# Patient Record
Sex: Male | Born: 1950 | Race: White | Hispanic: No | Marital: Married | Smoking: Never smoker
Health system: Southern US, Community
[De-identification: ages and names within clinical notes are randomized; demographics above are authoritative.]

## PROBLEM LIST (undated history)

## (undated) DIAGNOSIS — C61 Malignant neoplasm of prostate: Secondary | ICD-10-CM

## (undated) DIAGNOSIS — Z8619 Personal history of other infectious and parasitic diseases: Secondary | ICD-10-CM

## (undated) DIAGNOSIS — K5909 Other constipation: Secondary | ICD-10-CM

## (undated) DIAGNOSIS — R972 Elevated prostate specific antigen [PSA]: Secondary | ICD-10-CM

## (undated) DIAGNOSIS — N400 Enlarged prostate without lower urinary tract symptoms: Secondary | ICD-10-CM

## (undated) DIAGNOSIS — Z87898 Personal history of other specified conditions: Secondary | ICD-10-CM

## (undated) DIAGNOSIS — N401 Enlarged prostate with lower urinary tract symptoms: Secondary | ICD-10-CM

## (undated) DIAGNOSIS — Z86010 Personal history of colonic polyps: Secondary | ICD-10-CM

## (undated) DIAGNOSIS — H269 Unspecified cataract: Secondary | ICD-10-CM

## (undated) DIAGNOSIS — N529 Male erectile dysfunction, unspecified: Secondary | ICD-10-CM

## (undated) DIAGNOSIS — Z973 Presence of spectacles and contact lenses: Secondary | ICD-10-CM

## (undated) DIAGNOSIS — Z8249 Family history of ischemic heart disease and other diseases of the circulatory system: Secondary | ICD-10-CM

## (undated) HISTORY — PX: COLONOSCOPY: SHX174

## (undated) HISTORY — PX: TONSILLECTOMY: SUR1361

## (undated) HISTORY — PX: EYE SURGERY: SHX253

## (undated) HISTORY — DX: Personal history of other infectious and parasitic diseases: Z86.19

## (undated) HISTORY — DX: Unspecified cataract: H26.9

## (undated) HISTORY — PX: CARDIOVASCULAR STRESS TEST: SHX262

---

## 2004-02-03 DIAGNOSIS — Z860100 Personal history of colon polyps, unspecified: Secondary | ICD-10-CM

## 2004-02-03 HISTORY — DX: Personal history of colonic polyps: Z86.010

## 2004-02-03 HISTORY — DX: Personal history of colon polyps, unspecified: Z86.0100

## 2004-05-28 ENCOUNTER — Ambulatory Visit: Payer: Self-pay | Admitting: Internal Medicine

## 2004-06-11 ENCOUNTER — Encounter: Payer: Self-pay | Admitting: Internal Medicine

## 2005-03-19 ENCOUNTER — Ambulatory Visit: Payer: Self-pay | Admitting: Internal Medicine

## 2005-03-26 ENCOUNTER — Ambulatory Visit: Payer: Self-pay | Admitting: Internal Medicine

## 2006-05-24 ENCOUNTER — Ambulatory Visit: Payer: Self-pay | Admitting: Internal Medicine

## 2006-09-24 DIAGNOSIS — J069 Acute upper respiratory infection, unspecified: Secondary | ICD-10-CM | POA: Insufficient documentation

## 2006-09-24 DIAGNOSIS — K644 Residual hemorrhoidal skin tags: Secondary | ICD-10-CM | POA: Insufficient documentation

## 2006-09-28 ENCOUNTER — Ambulatory Visit: Payer: Self-pay | Admitting: Family Medicine

## 2006-10-01 ENCOUNTER — Telehealth: Payer: Self-pay | Admitting: Family Medicine

## 2007-07-21 ENCOUNTER — Ambulatory Visit: Payer: Self-pay | Admitting: Internal Medicine

## 2007-07-21 LAB — CONVERTED CEMR LAB
AST: 19 units/L (ref 0–37)
Alkaline Phosphatase: 53 units/L (ref 39–117)
Bilirubin, Direct: 0.1 mg/dL (ref 0.0–0.3)
Chloride: 103 meq/L (ref 96–112)
Eosinophils Absolute: 0.1 10*3/uL (ref 0.0–0.7)
GFR calc Af Amer: 111 mL/min
GFR calc non Af Amer: 92 mL/min
Glucose, Bld: 86 mg/dL (ref 70–99)
HCT: 44.3 % (ref 39.0–52.0)
HDL: 48.6 mg/dL (ref >39.0)
Monocytes Absolute: 0.5 10*3/uL (ref 0.1–1.0)
Monocytes Relative: 10.4 % (ref 3.0–12.0)
Neutrophils Relative %: 46.6 % (ref 43.0–77.0)
Platelets: 184 10*3/uL (ref 150–400)
Potassium: 4.4 meq/L (ref 3.5–5.1)
RDW: 12.6 % (ref 11.5–14.6)
Sodium: 141 meq/L (ref 135–145)
Total CHOL/HDL Ratio: 3.9
Triglycerides: 84 mg/dL (ref 0–149)
VLDL: 17 mg/dL (ref 0–40)

## 2007-07-28 ENCOUNTER — Ambulatory Visit: Payer: Self-pay | Admitting: Internal Medicine

## 2007-08-01 LAB — CONVERTED CEMR LAB: CRP, High Sensitivity: <1 — ABNORMAL LOW (ref 0.00–5.00)

## 2007-10-21 ENCOUNTER — Ambulatory Visit: Payer: Self-pay | Admitting: Internal Medicine

## 2007-10-21 DIAGNOSIS — M542 Cervicalgia: Secondary | ICD-10-CM

## 2008-08-07 ENCOUNTER — Telehealth: Payer: Self-pay | Admitting: Internal Medicine

## 2009-08-23 ENCOUNTER — Ambulatory Visit: Payer: Self-pay | Admitting: Internal Medicine

## 2009-08-23 LAB — CONVERTED CEMR LAB
AST: 20 units/L (ref 0–37)
BUN: 11 mg/dL (ref 6–23)
Basophils Absolute: 0 10*3/uL (ref 0.0–0.1)
Bilirubin Urine: NEGATIVE
Blood in Urine, dipstick: NEGATIVE
Calcium: 9.7 mg/dL (ref 8.4–10.5)
Cholesterol: 211 mg/dL — ABNORMAL HIGH (ref 0–200)
Creatinine, Ser: 0.7 mg/dL (ref 0.4–1.5)
GFR calc non Af Amer: 114.11 mL/min (ref >60)
Glucose, Bld: 93 mg/dL (ref 70–99)
Glucose, Urine, Semiquant: NEGATIVE
HCT: 41.6 % (ref 39.0–52.0)
HDL: 53.4 mg/dL (ref >39.00)
Ketones, urine, test strip: NEGATIVE
Lymphs Abs: 2.5 10*3/uL (ref 0.7–4.0)
Monocytes Relative: 8.3 % (ref 3.0–12.0)
Nitrite: NEGATIVE
PSA: 1.14 ng/mL (ref 0.10–4.00)
Platelets: 176 10*3/uL (ref 150.0–400.0)
Potassium: 4.1 meq/L (ref 3.5–5.1)
Protein, U semiquant: NEGATIVE
RDW: 13.3 % (ref 11.5–14.6)
Specific Gravity, Urine: 1.015
TSH: 2.01 microintl units/mL (ref 0.35–5.50)
Total Bilirubin: 0.7 mg/dL (ref 0.3–1.2)
Triglycerides: 79 mg/dL (ref 0.0–149.0)
Urobilinogen, UA: 0.2
WBC Urine, dipstick: NEGATIVE
pH: 6

## 2009-08-30 ENCOUNTER — Ambulatory Visit: Payer: Self-pay | Admitting: Internal Medicine

## 2009-08-30 DIAGNOSIS — N402 Nodular prostate without lower urinary tract symptoms: Secondary | ICD-10-CM

## 2009-09-25 ENCOUNTER — Encounter: Payer: Self-pay | Admitting: Internal Medicine

## 2010-01-20 ENCOUNTER — Ambulatory Visit: Payer: Self-pay | Admitting: Internal Medicine

## 2010-03-04 NOTE — Assessment & Plan Note (Signed)
Summary: cpx//ccm   Vital Signs:  Patient profile:   60 year old male Height:      69.5 inches Weight:      158 pounds BMI:     23.08 Temp:     98.4 degrees F oral BP sitting:   120 / 76  (right arm) Cuff size:   regular  Vitals Entered By: Duard Brady LPN (August 30, 2009 2:38 PM) CC: cpx - doing well Is Patient Diabetic? No   CC:  cpx - doing well.  History of Present Illness: 60 year old patient who is seen today for a wellness exam.  He does remarkably well.  For years, he has had some intermittent difficulties with symptomatic hemorrhoids.  Otherwise, no real complaints.  Preventive Screening-Counseling & Management  Alcohol-Tobacco     Smoking Status: never  Allergies (verified): No Known Drug Allergies  Past History:  Past Medical History: unremarkable symptomatic hemorrhoids left prostate  nodule  Past Surgical History: Reviewed history from 07/28/2007 and no changes required. Tonsillectomy  age 58 or 4  Colonoscopy.  2006  Family History: Reviewed history from 07/28/2007 and no changes required. both parents died at age 26.  Father history of MI, polyps mother died  (13) of  complications of congestive heart failure, Parkinson's disease, history of breast cancer  one brother, status post CABG (age 46)  Social History: Reviewed history from 07/28/2007 and no changes required. Married Never Smoked  Review of Systems  The patient denies anorexia, fever, weight loss, weight gain, vision loss, decreased hearing, hoarseness, chest pain, syncope, dyspnea on exertion, peripheral edema, prolonged cough, headaches, hemoptysis, abdominal pain, melena, hematochezia, severe indigestion/heartburn, hematuria, incontinence, genital sores, muscle weakness, suspicious skin lesions, transient blindness, difficulty walking, depression, unusual weight change, abnormal bleeding, enlarged lymph nodes, angioedema, breast masses, and testicular masses.    Physical  Exam  General:  Well-developed,well-nourished,in no acute distress; alert,appropriate and cooperative throughout examination Head:  Normocephalic and atraumatic without obvious abnormalities. No apparent alopecia or balding. Eyes:  No corneal or conjunctival inflammation noted. EOMI. Perrla. Funduscopic exam benign, without hemorrhages, exudates or papilledema. Vision grossly normal. Ears:  External ear exam shows no significant lesions or deformities.  Otoscopic examination reveals clear canals, tympanic membranes are intact bilaterally without bulging, retraction, inflammation or discharge. Hearing is grossly normal bilaterally. Nose:  External nasal examination shows no deformity or inflammation. Nasal mucosa are pink and moist without lesions or exudates. Mouth:  Oral mucosa and oropharynx without lesions or exudates.  Teeth in good repair. Neck:  No deformities, masses, or tenderness noted. Chest Wall:  No deformities, masses, tenderness or gynecomastia noted. Breasts:  No masses or gynecomastia noted Lungs:  Normal respiratory effort, chest expands symmetrically. Lungs are clear to auscultation, no crackles or wheezes. Heart:  Normal rate and regular rhythm. S1 and S2 normal without gallop, murmur, click, rub or other extra sounds. Abdomen:  Bowel sounds positive,abdomen soft and non-tender without masses, organomegaly or hernias noted. Rectal:  single internal hemorrhoid noted Genitalia:  Testes bilaterally descended without nodularity, tenderness or masses. No scrotal masses or lesions. No penis lesions or urethral discharge. Prostate:  left prostate nodule Msk:  No deformity or scoliosis noted of thoracic or lumbar spine.   Pulses:  R and L carotid,radial,femoral,dorsalis pedis and posterior tibial pulses are full and equal bilaterally Extremities:  No clubbing, cyanosis, edema, or deformity noted with normal full range of motion of all joints.   Neurologic:  No cranial nerve deficits  noted. Station and gait  are normal. Plantar reflexes are down-going bilaterally. DTRs are symmetrical throughout. Sensory, motor and coordinative functions appear intact. Skin:  Intact without suspicious lesions or rashes Cervical Nodes:  No lymphadenopathy noted Axillary Nodes:  No palpable lymphadenopathy Inguinal Nodes:  No significant adenopathy Psych:  Cognition and judgment appear intact. Alert and cooperative with normal attention span and concentration. 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 Once daily 2)  Stool Softener 100 Mg Caps (Docusate sodium) .... Qd  Other Orders: Urology Referral (Urology)  Patient Instructions: 1)  Please schedule a follow-up appointment in 1 year. 2)  It is important that you exercise regularly at least 20 minutes 5 times a week. If you develop chest pain, have severe difficulty breathing, or feel very tired , stop exercising immediately and seek medical attention. 3)  urology referral as discussed 4)  consider general surgical referral for evaluation of hemorrhoids

## 2010-03-04 NOTE — Consult Note (Signed)
Summary: [facility]  [facility]   Imported By: Maryln Gottron 10/04/2009 12:11:24  _____________________________________________________________________  External Attachment:    Type:   Image     Comment:   External Document

## 2010-03-06 NOTE — Assessment & Plan Note (Signed)
Summary: FLU SHOT//CCM  Nurse Visit   Allergies: No Known Drug Allergies  Immunizations Administered:  Influenza Vaccine # 1:    Vaccine Type: Fluvax 3+    Site: left deltoid    Mfr: GlaxoSmithKline    Dose: 0.5 ml    Route: IM    Given by: Duard Brady LPN    Exp. Date: 08/02/2010    Lot #: [id number]    VIS given: 08/27/09 version given January 20, 2010.    Physician counseled: yes  Flu Vaccine Consent Questions:    Do you have a history of severe allergic reactions to this vaccine? no    Any prior history of allergic reactions to egg and/or gelatin? no    Do you have a sensitivity to the preservative Thimersol? no    Do you have a past history of Guillan-Barre Syndrome? no    Do you currently have an acute febrile illness? no    Have you ever had a severe reaction to latex? no    Vaccine information given and explained to patient? yes  Orders Added: 1)  Flu Vaccine 54yrs + [90658] 2)  Admin 1st Vaccine [91478]

## 2010-08-27 ENCOUNTER — Other Ambulatory Visit (INDEPENDENT_AMBULATORY_CARE_PROVIDER_SITE_OTHER): Payer: Federal, State, Local not specified - PPO

## 2010-08-27 DIAGNOSIS — Z0000 Encounter for general adult medical examination without abnormal findings: Secondary | ICD-10-CM

## 2010-08-27 LAB — HEPATIC FUNCTION PANEL
ALT: 17 U/L (ref 0–53)
AST: 17 U/L (ref 0–37)
Albumin: 5 g/dL (ref 3.5–5.2)
Alkaline Phosphatase: 55 U/L (ref 39–117)

## 2010-08-27 LAB — POCT URINALYSIS DIPSTICK
Ketones, UA: NEGATIVE
Protein, UA: NEGATIVE
Spec Grav, UA: 1.015
Urobilinogen, UA: 0.2

## 2010-08-27 LAB — CBC WITH DIFFERENTIAL/PLATELET
Basophils Relative: 0.6 % (ref 0.0–3.0)
Eosinophils Relative: 2.1 % (ref 0.0–5.0)
HCT: 42.6 % (ref 39.0–52.0)
Hemoglobin: 14.5 g/dL (ref 13.0–17.0)
Lymphs Abs: 1.8 10*3/uL (ref 0.7–4.0)
Monocytes Relative: 10.2 % (ref 3.0–12.0)
Neutro Abs: 2.5 10*3/uL (ref 1.4–7.7)
RBC: 4.53 Mil/uL (ref 4.22–5.81)
WBC: 4.9 10*3/uL (ref 4.5–10.5)

## 2010-08-27 LAB — BASIC METABOLIC PANEL
GFR: 101.03 mL/min (ref >60.00)
Glucose, Bld: 80 mg/dL (ref 70–99)
Potassium: 4.5 mEq/L (ref 3.5–5.1)
Sodium: 141 mEq/L (ref 135–145)

## 2010-08-27 LAB — LIPID PANEL
Cholesterol: 193 mg/dL (ref 0–200)
HDL: 61.7 mg/dL (ref >39.00)
Triglycerides: 45 mg/dL (ref 0.0–149.0)

## 2010-08-27 LAB — TSH: TSH: 1.66 u[IU]/mL (ref 0.35–5.50)

## 2010-09-03 ENCOUNTER — Encounter: Payer: Self-pay | Admitting: Internal Medicine

## 2010-09-04 ENCOUNTER — Ambulatory Visit (INDEPENDENT_AMBULATORY_CARE_PROVIDER_SITE_OTHER): Payer: Federal, State, Local not specified - PPO | Admitting: Internal Medicine

## 2010-09-04 ENCOUNTER — Encounter: Payer: Self-pay | Admitting: Internal Medicine

## 2010-09-04 DIAGNOSIS — K644 Residual hemorrhoidal skin tags: Secondary | ICD-10-CM

## 2010-09-04 DIAGNOSIS — Z0000 Encounter for general adult medical examination without abnormal findings: Secondary | ICD-10-CM

## 2010-09-04 NOTE — Progress Notes (Signed)
  Subjective:    Patient ID: John [name], male    DOB: 03-19-1950, 60 y.o.   MRN: [id number]  HPI  60 year old patient who is seen today for an annual exam. He does remarkably well and is on no chronic medications. Complaints include some occasional right shoulder pain. He has some difficulty with the external hemorrhoids. Finally he complains of some fatigue that usually resolves with the cooler fall weather.    Review of Systems  Constitutional: Positive for fatigue. Negative for fever, chills, activity change and appetite change.  HENT: Negative for hearing loss, ear pain, congestion, rhinorrhea, sneezing, mouth sores, trouble swallowing, neck pain, neck stiffness, dental problem, voice change, sinus pressure and tinnitus.   Eyes: Negative for photophobia, pain, redness and visual disturbance.  Respiratory: Negative for apnea, cough, choking, chest tightness, shortness of breath and wheezing.   Cardiovascular: Negative for chest pain, palpitations and leg swelling.  Gastrointestinal: Negative for nausea, vomiting, abdominal pain, diarrhea, constipation, blood in stool, abdominal distention, anal bleeding and rectal pain.  Genitourinary: Negative for dysuria, urgency, frequency, hematuria, flank pain, decreased urine volume, discharge, penile swelling, scrotal swelling, difficulty urinating, genital sores and testicular pain.  Musculoskeletal: Negative for myalgias, back pain, joint swelling, arthralgias and gait problem.  Skin: Negative for color change, rash and wound.  Neurological: Negative for dizziness, tremors, seizures, syncope, facial asymmetry, speech difficulty, weakness, light-headedness, numbness and headaches.  Hematological: Negative for adenopathy. Does not bruise/bleed easily.  Psychiatric/Behavioral: Negative for suicidal ideas, hallucinations, behavioral problems, confusion, sleep disturbance, self-injury, dysphoric mood, decreased concentration and agitation. The patient  is not nervous/anxious.        Objective:   Physical Exam  Constitutional: He appears well-developed and well-nourished.  HENT:  Head: Normocephalic and atraumatic.  Right Ear: External ear normal.  Left Ear: External ear normal.  Nose: Nose normal.  Mouth/Throat: Oropharynx is clear and moist.  Eyes: Conjunctivae and EOM are normal. Pupils are equal, round, and reactive to light. No scleral icterus.  Neck: Normal range of motion. Neck supple. No JVD present. No thyromegaly present.  Cardiovascular: Regular rhythm, normal heart sounds and intact distal pulses.  Exam reveals no gallop and no friction rub.   No murmur heard. Pulmonary/Chest: Effort normal and breath sounds normal. He exhibits no tenderness.  Abdominal: Soft. Bowel sounds are normal. He exhibits no distension and no mass. There is no tenderness.  Genitourinary: Penis normal.       Urology exam in September of last year and in March of this year  Musculoskeletal: Normal range of motion. He exhibits no edema and no tenderness.  Lymphadenopathy:    He has no cervical adenopathy.  Neurological: He is alert. He has normal reflexes. No cranial nerve deficit. Coordination normal.  Skin: Skin is warm and dry. No rash noted.  Psychiatric: He has a normal mood and affect. His behavior is normal.          Assessment & Plan:   Preventive health examination. We'll consider orthopedic or general surgery referral if his right shoulder pain her hemorrhoidal disease worsens Return in one year for followup

## 2010-09-04 NOTE — Patient Instructions (Signed)
It is important that you exercise regularly, at least 20 minutes 3 to 4 times per week.  If you develop chest pain or shortness of breath seek  medical attention.  Return in one year for follow-up   

## 2010-12-05 ENCOUNTER — Ambulatory Visit (INDEPENDENT_AMBULATORY_CARE_PROVIDER_SITE_OTHER): Payer: Federal, State, Local not specified - PPO | Admitting: Family Medicine

## 2010-12-05 ENCOUNTER — Encounter: Payer: Self-pay | Admitting: Family Medicine

## 2010-12-05 DIAGNOSIS — J029 Acute pharyngitis, unspecified: Secondary | ICD-10-CM

## 2010-12-05 DIAGNOSIS — R21 Rash and other nonspecific skin eruption: Secondary | ICD-10-CM

## 2010-12-05 DIAGNOSIS — R17 Unspecified jaundice: Secondary | ICD-10-CM

## 2010-12-05 LAB — POCT URINALYSIS DIPSTICK
Glucose, UA: NEGATIVE
Ketones, UA: NEGATIVE
Leukocytes, UA: NEGATIVE
Nitrite, UA: NEGATIVE
pH, UA: 5

## 2010-12-05 LAB — CBC WITH DIFFERENTIAL/PLATELET
Basophils Absolute: 0 10*3/uL (ref 0.0–0.1)
Basophils Relative: 0.2 % (ref 0.0–3.0)
Eosinophils Absolute: 0.3 10*3/uL (ref 0.0–0.7)
HCT: 41.3 % (ref 39.0–52.0)
Hemoglobin: 13.9 g/dL (ref 13.0–17.0)
Lymphs Abs: 0.8 10*3/uL (ref 0.7–4.0)
MCHC: 33.7 g/dL (ref 30.0–36.0)
Monocytes Relative: 15.1 % — ABNORMAL HIGH (ref 3.0–12.0)
Neutro Abs: 2.9 10*3/uL (ref 1.4–7.7)
RBC: 4.38 Mil/uL (ref 4.22–5.81)
RDW: 14.3 % (ref 11.5–14.6)

## 2010-12-05 LAB — HEPATIC FUNCTION PANEL
Alkaline Phosphatase: 343 U/L — ABNORMAL HIGH (ref 39–117)
Bilirubin, Direct: 2.1 mg/dL — ABNORMAL HIGH (ref 0.0–0.3)

## 2010-12-05 LAB — BASIC METABOLIC PANEL
BUN: 10 mg/dL (ref 6–23)
CO2: 27 mEq/L (ref 19–32)
Calcium: 8.8 mg/dL (ref 8.4–10.5)
Creatinine, Ser: 0.8 mg/dL (ref 0.4–1.5)

## 2010-12-05 NOTE — Progress Notes (Signed)
  Subjective:    Patient ID: John [name], male    DOB: 15-Jul-1950, 60 y.o.   MRN: [id number]  HPI  Acute visit. Patient had acute onset of illness early this week. Initially had some sore throat along with fever up to 101 and shaking chills Monday night. He also had some nasal congestion and relatively mild cough and sneezing. Took some Allegra and naproxen and fever only lasted about 12 hours. Subsequently developed dark urine by the next day which has persisted. He denies any abdominal pain. No nausea or vomiting. Yesterday developed diffuse blanching rash on trunk and extremities. Rash has faded somewhat today. Overall symptomatically better. Rash is nonpruritic and nonpainful. No fever since Monday.  Recent history is that he had [place] cruise couple weeks ago. Does not recall being sick on the cruise. Remote history of hepatitis A several years ago and patient also thinks he had hepatitis A vaccine. Takes no regular medications. No recent appetite or weight changes   Review of Systems  Constitutional: Positive for fatigue. Negative for fever and unexpected weight change.  Respiratory: Negative for shortness of breath.   Cardiovascular: Negative for chest pain, palpitations and leg swelling.  Gastrointestinal: Negative for nausea, vomiting, abdominal pain, diarrhea and blood in stool.  Genitourinary: Negative for dysuria and urgency.  Musculoskeletal: Negative for back pain.  Neurological: Negative for dizziness and headaches.  Hematological: Negative for adenopathy. Does not bruise/bleed easily.       Objective:   Physical Exam  Constitutional: He appears well-developed and well-nourished.  HENT:  Mouth/Throat: Oropharynx is clear and moist.  Neck: Neck supple. No thyromegaly present.  Cardiovascular: Normal rate and regular rhythm.   Pulmonary/Chest: Effort normal and breath sounds normal. No respiratory distress. He has no wheezes. He has no rales.  Abdominal: Soft. Bowel  sounds are normal. He exhibits no distension and no mass. There is no tenderness. There is no rebound and no guarding.       No hepatomegaly or splenomegaly  Musculoskeletal: He exhibits no edema.  Lymphadenopathy:    He has no cervical adenopathy.  Skin:       Patient has scattered nonspecific erythematous macular blanching rash trunk and extremities. Non-scaly. No vesicles. No pustules. Nontender  Patient has some subtle jaundice involving skin head and neck and upper trunk          Assessment & Plan:  Acute illness. Patient developed initially sore throat, nasal congestion, cough ,and acute fever for 12 hours suggesting probable viral illness. Overall symptomatically better but now has rash which is likely viral exanthem and also appears to have some mild jaundice. He did not have anything to suggest likely acute cholecystitis. Rapid strep negative. Urine dipstick large bilirubin. Obtain further labs with hepatic panel, CBC, basic metabolic panel. Appears to be resolving symptomatically. Prompt followup for any recurrent fever, abdominal pain, or vomiting. Likely get followup early next week to repeat some labs and reassess.  History of reported Hep A and  B vaccines so acute infectious hepatitis less likely.

## 2010-12-05 NOTE — Patient Instructions (Signed)
Follow up promptly for any fever, abdominal pain, vomiting.

## 2010-12-09 ENCOUNTER — Telehealth: Payer: Self-pay | Admitting: Internal Medicine

## 2010-12-09 NOTE — Telephone Encounter (Signed)
Pt need bloodwork results °

## 2010-12-09 NOTE — Telephone Encounter (Signed)
Pt informed of labs and need for return OV this week

## 2010-12-09 NOTE — Progress Notes (Signed)
Quick Note:  Pt informed and he will schedule a return OV this week ______

## 2010-12-11 ENCOUNTER — Encounter: Payer: Self-pay | Admitting: Internal Medicine

## 2010-12-11 ENCOUNTER — Ambulatory Visit (INDEPENDENT_AMBULATORY_CARE_PROVIDER_SITE_OTHER): Payer: Federal, State, Local not specified - PPO | Admitting: Internal Medicine

## 2010-12-11 VITALS — BP 108/80 | Temp 98.3°F | Wt 160.0 lb

## 2010-12-11 DIAGNOSIS — R509 Fever, unspecified: Secondary | ICD-10-CM

## 2010-12-11 DIAGNOSIS — R17 Unspecified jaundice: Secondary | ICD-10-CM

## 2010-12-11 LAB — CBC WITH DIFFERENTIAL/PLATELET
Basophils Relative: 0.2 % (ref 0.0–3.0)
Eosinophils Relative: 8.7 % — ABNORMAL HIGH (ref 0.0–5.0)
HCT: 41.6 % (ref 39.0–52.0)
Hemoglobin: 14.1 g/dL (ref 13.0–17.0)
Lymphs Abs: 1.2 10*3/uL (ref 0.7–4.0)
MCV: 94.2 fl (ref 78.0–100.0)
Monocytes Absolute: 1 10*3/uL (ref 0.1–1.0)
Monocytes Relative: 17.1 % — ABNORMAL HIGH (ref 3.0–12.0)
Neutro Abs: 3 10*3/uL (ref 1.4–7.7)
Platelets: 275 10*3/uL (ref 150.0–400.0)
RBC: 4.42 Mil/uL (ref 4.22–5.81)
WBC: 5.7 10*3/uL (ref 4.5–10.5)

## 2010-12-11 LAB — COMPREHENSIVE METABOLIC PANEL
Alkaline Phosphatase: 242 U/L — ABNORMAL HIGH (ref 39–117)
CO2: 30 mEq/L (ref 19–32)
Creatinine, Ser: 0.9 mg/dL (ref 0.4–1.5)
GFR: 95.54 mL/min (ref >60.00)
Glucose, Bld: 84 mg/dL (ref 70–99)
Sodium: 140 mEq/L (ref 135–145)
Total Bilirubin: 1.4 mg/dL — ABNORMAL HIGH (ref 0.3–1.2)
Total Protein: 7.8 g/dL (ref 6.0–8.3)

## 2010-12-11 NOTE — Patient Instructions (Signed)
Call or return to clinic prn if these symptoms worsen or fail to improve as anticipated.

## 2010-12-11 NOTE — Progress Notes (Signed)
  Subjective:    Patient ID: John [name], male    DOB: March 21, 1950, 60 y.o.   MRN: [id number]  HPI  60 year old patient who was seen in follow up today. He was seen one week ago did do a acute febrile illness associated with mild sore throat exanthem and jaundice. Liver function studies revealed an obstructive pattern. His dark urine and jaundice has resolved he has had no recurrent fever or chills but still has persistent malaise. No abdominal pain. Symptoms began at the end of a cruise in the [place].  Review of Systems  Constitutional: Positive for fatigue. Negative for fever, chills and appetite change.  HENT: Negative for hearing loss, ear pain, congestion, sore throat, trouble swallowing, neck stiffness, dental problem, voice change and tinnitus.   Eyes: Negative for pain, discharge and visual disturbance.  Respiratory: Negative for cough, chest tightness, wheezing and stridor.   Cardiovascular: Negative for chest pain, palpitations and leg swelling.  Gastrointestinal: Negative for nausea, vomiting, abdominal pain, diarrhea, constipation, blood in stool and abdominal distention.  Genitourinary: Negative for urgency, hematuria, flank pain, discharge, difficulty urinating and genital sores.  Musculoskeletal: Negative for myalgias, back pain, joint swelling, arthralgias and gait problem.  Skin: Negative for rash.  Neurological: Negative for dizziness, syncope, speech difficulty, weakness, numbness and headaches.  Hematological: Negative for adenopathy. Does not bruise/bleed easily.  Psychiatric/Behavioral: Negative for behavioral problems and dysphoric mood. The patient is not nervous/anxious.        Objective:   Physical Exam  Constitutional: He is oriented to person, place, and time. He appears well-developed.  HENT:  Head: Normocephalic.  Right Ear: External ear normal.  Left Ear: External ear normal.  Eyes: Conjunctivae and EOM are normal.       No icterus  Neck: Normal range  of motion.  Cardiovascular: Normal rate and normal heart sounds.   Pulmonary/Chest: Breath sounds normal.  Abdominal: Soft. Bowel sounds are normal. He exhibits no distension and no mass. There is no tenderness. There is no rebound and no guarding.  Musculoskeletal: Normal range of motion. He exhibits no edema and no tenderness.  Neurological: He is alert and oriented to person, place, and time.  Skin:       Resolving erythematous rash anterior chest  Psychiatric: He has a normal mood and affect. His behavior is normal.          Assessment & Plan:    Probable viral syndrome. We'll recheck LFTs. If there are still some abnormalities will check an abdominal ultrasound. He has had no recurrent fever or chills but does have some persistent fatigue he will call up there is any recurrent symptoms

## 2010-12-12 ENCOUNTER — Other Ambulatory Visit: Payer: Self-pay | Admitting: Internal Medicine

## 2010-12-12 DIAGNOSIS — R748 Abnormal levels of other serum enzymes: Secondary | ICD-10-CM

## 2010-12-12 NOTE — Progress Notes (Signed)
Quick Note:  Spoke with pt - informed of labs and dr. Vernon Prey instructions for sono to be ordered. KIK ______

## 2010-12-18 ENCOUNTER — Ambulatory Visit
Admission: RE | Admit: 2010-12-18 | Discharge: 2010-12-18 | Disposition: A | Discharge status: Home / Self Care | Payer: Federal, State, Local not specified - PPO | Source: Ambulatory Visit | Attending: Internal Medicine | Admitting: Internal Medicine

## 2010-12-18 DIAGNOSIS — R748 Abnormal levels of other serum enzymes: Secondary | ICD-10-CM

## 2010-12-18 NOTE — Progress Notes (Signed)
Quick Note:  Spoke with pt- informed of results - normal - dr. Amador Cunas would like to have rov in 2 wks- will call in am AND MAKE APPT ______

## 2011-01-02 ENCOUNTER — Encounter: Payer: Self-pay | Admitting: Internal Medicine

## 2011-01-02 ENCOUNTER — Ambulatory Visit (INDEPENDENT_AMBULATORY_CARE_PROVIDER_SITE_OTHER): Payer: Federal, State, Local not specified - PPO | Admitting: Internal Medicine

## 2011-01-02 VITALS — BP 108/68 | Temp 98.1°F | Wt 159.0 lb

## 2011-01-02 DIAGNOSIS — Z23 Encounter for immunization: Secondary | ICD-10-CM

## 2011-01-02 DIAGNOSIS — K759 Inflammatory liver disease, unspecified: Secondary | ICD-10-CM

## 2011-01-02 DIAGNOSIS — Z0000 Encounter for general adult medical examination without abnormal findings: Secondary | ICD-10-CM

## 2011-01-02 LAB — COMPREHENSIVE METABOLIC PANEL
AST: 19 U/L (ref 0–37)
BUN: 14 mg/dL (ref 6–23)
Calcium: 9.6 mg/dL (ref 8.4–10.5)
Chloride: 103 mEq/L (ref 96–112)
Creat: 0.8 mg/dL (ref 0.50–1.35)

## 2011-01-02 NOTE — Progress Notes (Signed)
  Subjective:    Patient ID: John [name], male    DOB: 04-28-1950, 60 y.o.   MRN: [id number]  HPI  60 year old patient who is seen today for followup. He presented with an acute febrile illness associated with jaundice and  exanthem. Liver function studies revealed a total bilirubin of 4.2 and mildly elevated transaminases. Followup LFTs revealed an improved bilirubin is still slightly high transaminases and a subsequent gallbladder ultrasound was performed this was normal with normal liver parenchyma and normal ductal system. The patient feels well.    Review of Systems  Constitutional: Negative for fever, chills, appetite change and fatigue.  HENT: Negative for hearing loss, ear pain, congestion, sore throat, trouble swallowing, neck stiffness, dental problem, voice change and tinnitus.   Eyes: Negative for pain, discharge and visual disturbance.  Respiratory: Negative for cough, chest tightness, wheezing and stridor.   Cardiovascular: Negative for chest pain, palpitations and leg swelling.  Gastrointestinal: Negative for nausea, vomiting, abdominal pain, diarrhea, constipation, blood in stool and abdominal distention.  Genitourinary: Negative for urgency, hematuria, flank pain, discharge, difficulty urinating and genital sores.  Musculoskeletal: Negative for myalgias, back pain, joint swelling, arthralgias and gait problem.  Skin: Negative for rash.  Neurological: Negative for dizziness, syncope, speech difficulty, weakness, numbness and headaches.  Hematological: Negative for adenopathy. Does not bruise/bleed easily.  Psychiatric/Behavioral: Negative for behavioral problems and dysphoric mood. The patient is not nervous/anxious.        Objective:   Physical Exam  Constitutional: He is oriented to person, place, and time. He appears well-developed.  HENT:  Head: Normocephalic.  Right Ear: External ear normal.  Left Ear: External ear normal.  Eyes: Conjunctivae and EOM are normal.  No scleral icterus.  Neck: Normal range of motion.  Cardiovascular: Normal rate and normal heart sounds.   Pulmonary/Chest: Breath sounds normal.  Abdominal: Bowel sounds are normal.       No organomegaly  Musculoskeletal: Normal range of motion. He exhibits no edema and no tenderness.  Neurological: He is alert and oriented to person, place, and time.  Psychiatric: He has a normal mood and affect. His behavior is normal.          Assessment & Plan:   Status post acute febrile illness with cholestatic jaundice. Status post normal gallbladder ultrasound. Patient clinically is well. We'll followup LFTs today and hopefully these have normalized

## 2011-01-02 NOTE — Patient Instructions (Signed)
Call or return to clinic prn if these symptoms worsen or fail to improve as anticipated.

## 2011-01-13 ENCOUNTER — Telehealth: Payer: Self-pay

## 2011-01-13 NOTE — Telephone Encounter (Signed)
Pt would like lab results. Please call at [phone]. Thanks.

## 2011-01-13 NOTE — Telephone Encounter (Signed)
Spoke with pt -- informed labs WNL

## 2011-02-26 ENCOUNTER — Encounter: Payer: Self-pay | Admitting: Internal Medicine

## 2011-02-26 ENCOUNTER — Ambulatory Visit (INDEPENDENT_AMBULATORY_CARE_PROVIDER_SITE_OTHER): Payer: Federal, State, Local not specified - PPO | Admitting: Internal Medicine

## 2011-02-26 VITALS — BP 90/60 | HR 92 | Temp 98.2°F | Wt 160.0 lb

## 2011-02-26 DIAGNOSIS — J069 Acute upper respiratory infection, unspecified: Secondary | ICD-10-CM

## 2011-02-26 DIAGNOSIS — R509 Fever, unspecified: Secondary | ICD-10-CM

## 2011-02-26 NOTE — Progress Notes (Signed)
  Subjective:    Patient ID: John [name], male    DOB: May 19, 1950, 61 y.o.   MRN: [id number]  HPI  61 year old patient who is seen today in followup. In November he was seen for evaluation of an acute febrile illness associated with cholestatic jaundice. Clinically he improved and liver function studies and hyperbilirubinemia completely normalize. At that time he had a gallbladder ultrasound that was normal. His condition near normalized but he states that he perhaps never got quite 100% well and 2 weeks ago developed  recurrent cold symptoms. He developed sore throat sinus congestion and malaise.   3 days ago he developed fever as high as 102. He feels this may have been an underestimation it to a poor functioning thermometer. He had significant body aches chills and this week has had poor appetite. He has been using aspirin and naproxen with some improvement. He has had nausea but no change in his bowel habits. He again has had dark urine over the past 3 days. At the present time he feels weak flushed anorexic but only has mild cold symptoms. He denies any abdominal pain. No further fever or chills.    Review of Systems  Constitutional: Positive for fever, diaphoresis, activity change, appetite change and fatigue. Negative for chills.  HENT: Positive for sore throat, rhinorrhea and sinus pressure. Negative for hearing loss, ear pain, congestion, trouble swallowing, neck stiffness, dental problem, voice change and tinnitus.   Eyes: Negative for pain, discharge and visual disturbance.  Respiratory: Negative for cough, chest tightness, wheezing and stridor.   Cardiovascular: Negative for chest pain, palpitations and leg swelling.  Gastrointestinal: Negative for nausea, vomiting, abdominal pain, diarrhea, constipation, blood in stool and abdominal distention.  Genitourinary: Negative for urgency, hematuria, flank pain, discharge, difficulty urinating and genital sores.  Musculoskeletal: Negative for  myalgias, back pain, joint swelling, arthralgias and gait problem.  Skin: Negative for rash.  Neurological: Negative for dizziness, syncope, speech difficulty, weakness, numbness and headaches.  Hematological: Negative for adenopathy. Does not bruise/bleed easily.  Psychiatric/Behavioral: Negative for behavioral problems and dysphoric mood. The patient is not nervous/anxious.        Objective:   Physical Exam  Constitutional: He is oriented to person, place, and time. He appears well-developed and well-nourished. No distress.  HENT:  Head: Normocephalic.  Right Ear: External ear normal.  Left Ear: External ear normal.       anicteric  Eyes: Conjunctivae and EOM are normal.  Neck: Normal range of motion.  Cardiovascular: Normal rate, regular rhythm and normal heart sounds.        No tachycardia  Pulmonary/Chest: Effort normal and breath sounds normal.  Abdominal: Soft. Bowel sounds are normal. He exhibits no distension. There is no tenderness. There is no rebound.  Musculoskeletal: Normal range of motion. He exhibits no edema and no tenderness.  Neurological: He is alert and oriented to person, place, and time.  Psychiatric: He has a normal mood and affect. His behavior is normal.          Assessment & Plan:    Acute febrile illness. History of dark urine. We'll check a CBC and  CMet   patient clearly has had symptoms of a viral URI. Will check labs to make sure that he has not developed further cholestatic jaundice. We'll continue symptomatic treatment with Tylenol and Aleve

## 2011-02-26 NOTE — Patient Instructions (Signed)
Drink as much fluid as you  can tolerate over the next few days   Take Aleve 200 mg twice daily for pain or swelling  Tylenol 1-2 tabs po q4h prn

## 2011-02-27 LAB — CBC WITH DIFFERENTIAL/PLATELET
Basophils Absolute: 0 10*3/uL (ref 0.0–0.1)
Hemoglobin: 14.4 g/dL (ref 13.0–17.0)
Lymphocytes Relative: 13.8 % (ref 12.0–46.0)
Monocytes Relative: 3.1 % (ref 3.0–12.0)
Neutrophils Relative %: 78.1 % — ABNORMAL HIGH (ref 43.0–77.0)
Platelets: 108 10*3/uL — ABNORMAL LOW (ref 150.0–400.0)
RDW: 13.7 % (ref 11.5–14.6)

## 2011-02-27 LAB — COMPREHENSIVE METABOLIC PANEL
ALT: 76 U/L — ABNORMAL HIGH (ref 0–53)
Albumin: 4 g/dL (ref 3.5–5.2)
CO2: 27 mEq/L (ref 19–32)
Calcium: 8.8 mg/dL (ref 8.4–10.5)
Chloride: 101 mEq/L (ref 96–112)
Creatinine, Ser: 0.8 mg/dL (ref 0.4–1.5)
GFR: 105.3 mL/min (ref >60.00)
Total Protein: 7 g/dL (ref 6.0–8.3)

## 2011-03-02 ENCOUNTER — Other Ambulatory Visit: Payer: Self-pay | Admitting: Internal Medicine

## 2011-03-02 ENCOUNTER — Telehealth: Payer: Self-pay | Admitting: *Deleted

## 2011-03-02 MED ORDER — CIPROFLOXACIN HCL 500 MG PO TABS: 500.0000 mg | ORAL_TABLET | Freq: Two times a day (BID) | ORAL | Status: DC

## 2011-03-02 NOTE — Telephone Encounter (Signed)
Message copied by Daphine Deutscher on Mon Mar 02, 2011  3:01 PM ------      Message from: Hilarie Fredrickson      Created: Mon Mar 02, 2011  2:42 PM       Rene Kocher, as the Doc of the day, I received a call from Dr. Amador Cunas regarding the above-named patient. Apparently has had intermittent abnormal liver tests and fever. The patient needs to be seen by GI. He is a patient of Dr. Juanda Chance. Have the patient seen by Dr. Juanda Chance this week. If she has no availability, have the patient seen by an extender. Thank you

## 2011-03-02 NOTE — Telephone Encounter (Signed)
Scheduled patient on 03/06/11 at 2:15/2:30 PM with Dr. Juanda Chance. Patient notified.

## 2011-03-04 ENCOUNTER — Encounter: Payer: Self-pay | Admitting: *Deleted

## 2011-03-06 ENCOUNTER — Ambulatory Visit (INDEPENDENT_AMBULATORY_CARE_PROVIDER_SITE_OTHER): Payer: Federal, State, Local not specified - PPO | Admitting: Internal Medicine

## 2011-03-06 ENCOUNTER — Other Ambulatory Visit (INDEPENDENT_AMBULATORY_CARE_PROVIDER_SITE_OTHER): Payer: Federal, State, Local not specified - PPO

## 2011-03-06 ENCOUNTER — Encounter: Payer: Self-pay | Admitting: Internal Medicine

## 2011-03-06 VITALS — BP 118/64 | HR 76 | Ht 69.0 in | Wt 156.0 lb

## 2011-03-06 DIAGNOSIS — R945 Abnormal results of liver function studies: Secondary | ICD-10-CM

## 2011-03-06 DIAGNOSIS — R7989 Other specified abnormal findings of blood chemistry: Secondary | ICD-10-CM

## 2011-03-06 DIAGNOSIS — R17 Unspecified jaundice: Secondary | ICD-10-CM

## 2011-03-06 LAB — IGA: IgA: 143 mg/dL (ref 68–378)

## 2011-03-06 NOTE — Progress Notes (Signed)
John [name] [birth year] MRN [id number]   History of Present Illness:  This is a 61 year old white male with 2 febrile episodes associated with abnormal liver function tests. His first episode occurred in November 2012 while on a cruise and his bilirubin increased to 4.2 with an AST of 42 and ALT of 117 and he had a pruritus. His alkaline phosphatase was 343. He was having nausea, chills and fever and a sore throat.  An upper abdominal ultrasound showed a normal common bile duct at 3.7 cm. His spleen was 9.8 cm and he had hepatomegaly with a 18 cm liver span. His gallbladder appeared normal. His second episode occurred 3 weeks ago and lasted 5 days. His temperature was 102. He was very weak and his urine was dark. He has a history of hepatitis at age 62 and  mononucleosis. He drinks alcohol daily, 1-2 drinks that he admits to. There is no family history of liver disease. He is on no medications other than Cipro 500 mg twice a day. His weight has been stable. He had a screening colonoscopy in 2006 which was normal. He had completely normal liver function tests in July 2012.   Past Medical History  Diagnosis Date  . Hemorrhoids     symptomatic  . Prostate nodule     left  . Abnormal liver function test   . History of hepatitis A    Past Surgical History  Procedure Date  . Tonsillectomy     reports that he has never smoked. He has never used smokeless tobacco. He reports that he drinks alcohol. He reports that he does not use illicit drugs. family history includes Breast cancer in his mother; Colon polyps in his father; Heart attack in his father; Heart disease in his brother and mother; and Parkinsonism in his mother.  There is no history of Colon cancer. No Known Allergies      Review of Systems: Occasional indigestion. Denies dysphagia chest pain or shortness of breath  The remainder of the 10 point ROS is negative except as outlined in H&P   Physical Exam: General appearance  Well  developed, in no distress. Eyes- non icteric. HEENT nontraumatic, normocephalic. Mouth no lesions, tongue papillated, no cheilosis. Neck supple without adenopathy, thyroid not enlarged, no carotid bruits, no JVD. Lungs Clear to auscultation bilaterally. Cor normal S1, normal S2, regular rhythm, no murmur,  quiet precordium. Abdomen: Soft abdomen. Nontender. Normoactive bowel sounds. Liver at costal margin with an overall span of 8-9 cm. No tenderness in right upper quadrant. Rest of the abdomen is unremarkable. Rectal: Soft Hemoccult negative stool Extremities no pedal edema. Skin no lesions, Dupuytren contractures. Neurological alert and oriented x 3. Psychological normal mood and affect.  Assessment and Plan:  Problem #1 2 episodes of febrile illness associated with abnormal liver function tests suggestive of cholangitis. The initial ultrasound suggested hepatomegaly but my exam today indicates a normal liver span. One has to suspect intermittent intrahepatic or extrahepatic biliary obstruction. We will proceed with an MRCP to rule out sclerosing cholangitis, Klatskin's tumor, cholangiocarcinoma . There is no evidence of portal hypertension.. At the same time, we will check for mono, IgA, IgM and IgG levels. We will also check an ANA titer, AMA and hepatitis A, B, and C antibodies. We will also obtain ferritin and ceruloplasmin. Depending on the results of the above, we will decide if the liver biopsy is justified.  Problem #2 colorectal screening - he is up to date on his colonoscopy.  03/06/2011 [name]

## 2011-03-06 NOTE — Patient Instructions (Addendum)
Your physician has requested that you go to the basement for the following lab work before leaving today: Hepatitis A, Hepatitis B surface antibody, Hepatitis B surface antigen, Hepatitis B core antibody, Hepatitis C Anti HCV, IgA, IgG, IgM, AMA, ANA, Smooth Muscle Antibody, ceruloplasmin, PT, Ferritin, GGT You have been scheduled for an MRCP at [facility] on Wednesday 03/10/10 @ 9:00 am. Please arrive 15 minutes prior to your scheduled appointment for registration. Make certain not to have anything to eat or drink 6 hours prior to test. CC: Dr Amador Cunas

## 2011-03-07 LAB — HEPATITIS C ANTIBODY: HCV Ab: NEGATIVE

## 2011-03-07 LAB — HEPATITIS B SURFACE ANTIBODY,QUALITATIVE: Hep B S Ab: POSITIVE — AB

## 2011-03-07 LAB — HEPATITIS B SURFACE ANTIGEN: Hepatitis B Surface Ag: NEGATIVE

## 2011-03-07 LAB — HEPATITIS B CORE ANTIBODY, TOTAL: Hep B Core Total Ab: NEGATIVE

## 2011-03-07 LAB — HEPATITIS A ANTIBODY, TOTAL: Hep A Total Ab: NEGATIVE

## 2011-03-11 ENCOUNTER — Other Ambulatory Visit: Payer: Self-pay | Admitting: Internal Medicine

## 2011-03-11 ENCOUNTER — Ambulatory Visit (HOSPITAL_COMMUNITY)
Admission: RE | Admit: 2011-03-11 | Discharge: 2011-03-11 | Disposition: A | Discharge status: Home / Self Care | Payer: Federal, State, Local not specified - PPO | Source: Ambulatory Visit | Attending: Internal Medicine | Admitting: Internal Medicine

## 2011-03-11 DIAGNOSIS — R509 Fever, unspecified: Secondary | ICD-10-CM | POA: Insufficient documentation

## 2011-03-11 DIAGNOSIS — Z1389 Encounter for screening for other disorder: Secondary | ICD-10-CM | POA: Insufficient documentation

## 2011-03-11 DIAGNOSIS — Z9889 Other specified postprocedural states: Secondary | ICD-10-CM

## 2011-03-11 DIAGNOSIS — R945 Abnormal results of liver function studies: Secondary | ICD-10-CM

## 2011-03-11 DIAGNOSIS — R748 Abnormal levels of other serum enzymes: Secondary | ICD-10-CM | POA: Insufficient documentation

## 2011-03-11 DIAGNOSIS — R7989 Other specified abnormal findings of blood chemistry: Secondary | ICD-10-CM

## 2011-03-11 DIAGNOSIS — R52 Pain, unspecified: Secondary | ICD-10-CM | POA: Insufficient documentation

## 2011-03-11 MED ORDER — GADOBENATE DIMEGLUMINE 529 MG/ML IV SOLN
14.0000 mL | Freq: Once | INTRAVENOUS | Status: AC | PRN
  Administered 2011-03-11: 14 mL via INTRAVENOUS

## 2011-03-12 ENCOUNTER — Telehealth: Payer: Self-pay | Admitting: *Deleted

## 2011-03-12 DIAGNOSIS — R945 Abnormal results of liver function studies: Secondary | ICD-10-CM

## 2011-03-12 NOTE — Telephone Encounter (Signed)
Left a message for patient to call me. 

## 2011-03-12 NOTE — Telephone Encounter (Signed)
Message copied by Daphine Deutscher on Thu Mar 12, 2011  2:37 PM ------      Message from: Hart Carwin      Created: Wed Mar 11, 2011 11:28 PM       Please call pt with essentially normal MRCP, no evidence of stones in the bile duct, no obstruction in the liver. All blood test came out OK. I would like to see him in about 8 weeks and repeat LFT's before his OV.Depending on the results we will discuss liver biopsy.

## 2011-03-13 NOTE — Telephone Encounter (Signed)
Spoke with patient and gave him results and recommendations as per Dr. Juanda Chance. Scheduled patient for labs on 05/01/11 and OV with Dr. Juanda Chance on 05/08/11 at 1:45 PM.

## 2011-04-15 ENCOUNTER — Encounter: Payer: Self-pay | Admitting: Internal Medicine

## 2011-04-28 ENCOUNTER — Telehealth: Payer: Self-pay | Admitting: *Deleted

## 2011-04-28 NOTE — Telephone Encounter (Signed)
Spoke with patient and he will come for labs early next week.

## 2011-04-28 NOTE — Telephone Encounter (Signed)
Unable to reach patient will try again later 

## 2011-04-28 NOTE — Telephone Encounter (Signed)
Message copied by Daphine Deutscher on Tue Apr 28, 2011 10:43 AM ------      Message from: Daphine Deutscher      Created: Fri Mar 13, 2011 10:40 AM       Call and remind due for LFT on 3/29 for DB

## 2011-05-06 ENCOUNTER — Other Ambulatory Visit (INDEPENDENT_AMBULATORY_CARE_PROVIDER_SITE_OTHER): Payer: Federal, State, Local not specified - PPO

## 2011-05-06 DIAGNOSIS — R7989 Other specified abnormal findings of blood chemistry: Secondary | ICD-10-CM

## 2011-05-06 DIAGNOSIS — R945 Abnormal results of liver function studies: Secondary | ICD-10-CM

## 2011-05-06 LAB — HEPATIC FUNCTION PANEL
ALT: 19 U/L (ref 0–53)
Bilirubin, Direct: 0.1 mg/dL (ref 0.0–0.3)
Total Protein: 6.7 g/dL (ref 6.0–8.3)

## 2011-05-06 LAB — FERRITIN: Ferritin: 156.9 ng/mL (ref 22.0–322.0)

## 2011-05-08 ENCOUNTER — Encounter: Payer: Self-pay | Admitting: Internal Medicine

## 2011-05-08 ENCOUNTER — Ambulatory Visit (INDEPENDENT_AMBULATORY_CARE_PROVIDER_SITE_OTHER): Payer: Federal, State, Local not specified - PPO | Admitting: Internal Medicine

## 2011-05-08 ENCOUNTER — Other Ambulatory Visit (INDEPENDENT_AMBULATORY_CARE_PROVIDER_SITE_OTHER): Payer: Federal, State, Local not specified - PPO

## 2011-05-08 VITALS — BP 118/64 | HR 60 | Ht 69.0 in | Wt 158.0 lb

## 2011-05-08 DIAGNOSIS — R160 Hepatomegaly, not elsewhere classified: Secondary | ICD-10-CM

## 2011-05-08 LAB — HEPATIC FUNCTION PANEL
AST: 18 U/L (ref 0–37)
Albumin: 4.9 g/dL (ref 3.5–5.2)
Alkaline Phosphatase: 81 U/L (ref 39–117)
Bilirubin, Direct: 0.1 mg/dL (ref 0.0–0.3)

## 2011-05-08 LAB — PROTIME-INR: INR: 1 ratio (ref 0.8–1.0)

## 2011-05-08 NOTE — Patient Instructions (Addendum)
Per your request, we have not scheduled your colonoscopy at this time. You will be due for colonoscopy in May 2013, so please call back to schedule at your convenience.  Your physician has requested that you go to the basement for the following lab work before leaving today: Ceruloplasmin, Mitochondrial Antibody, PT/INR In 3 months (around 08/07/11), please go to the basement floor of our building for repeat LFT's. CC: Dr Eleonore Chiquito

## 2011-05-08 NOTE — Progress Notes (Signed)
John [name] [birth year] MRN [id number]   History of Present Illness:  This is a 61 year old white male who is here for followup of abnormal liver function tests. His last liver function tests completed last week were normal. His last appointment with [place] was on 03/06/2011. He had 2 separate episodes of fever, chills, jaundice and abnormal liver function tests in the past 6 months. An upper abdominal ultrasound initially showed an enlarged liver and questionable enlarged spleen. His MRCP was normal in terms of biliary and pancreatic anatomy. He has done very well since his last appointment. He denies abdominal pain or fever. All his labs were negative. Due to a lab error at his last visit with [place], his ceruloplasmin level, PT and Anti Smooth Muscle Antibody were not drawn.    Past Medical History  Diagnosis Date  . Hemorrhoids     symptomatic  . Prostate nodule     left  . Abnormal liver function test   . History of hepatitis A    Past Surgical History  Procedure Date  . Tonsillectomy     reports that he has never smoked. He has never used smokeless tobacco. He reports that he drinks alcohol. He reports that he does not use illicit drugs. family history includes Breast cancer in his mother; Colon polyps in his father; Heart attack in his father; Heart disease in his brother and mother; and Parkinsonism in his mother.  There is no history of Colon cancer. No Known Allergies      Review of Systems:denies fever, chills, dark urine  The remainder of the 10 point ROS is negative except as outlined in H&P   Physical Exam: General appearance  Well developed, in no distress. Eyes- non icteric. HEENT nontraumatic, normocephalic. Mouth no lesions, tongue papillated, no cheilosis. Neck supple without adenopathy, thyroid not enlarged, no carotid bruits, no JVD. Lungs Clear to auscultation bilaterally. Cor normal S1, normal S2, regular rhythm, no murmur,  quiet precordium. Abdomen: Liver  is 1 to 2 cm below right costal margin. Nontender. Splenic tip not palpable. Rectal: Deferred. Extremities no pedal edema. Skin no lesions. Neurological alert and oriented x 3.,no asterixis Psychological normal mood and affect.  Assessment and Plan:  Problem #1 Resolution of prior episodes of fever, jaundice and abnormal liver function tests suggestive of cholangitis. He still has mild hepatomegaly. Hepatocellular disease has not been completely ruled out. We will obtain the blood tests that were not drawn on the last visit.Wilson's disease can present with acute episodes of fever due to copper release into the circulation. I have discussed the possibility of a liver biopsy but we have decided to wait and observe over the next few months to see if his episodes recur. Clinically, I suspect he might have passed sludge or small stones. If he has recurrent episodes, he will need either liver biopsy or ERCP with sphincterotomy depending on his presentation at the time and his liver function tests.Lap chole  To be considered in the future  Problem #2 Colorectal screening. He is due for a repeat colonoscopy which will be scheduled for May 2013. His last exam was in 2006.    05/08/2011 John [name]

## 2011-05-11 ENCOUNTER — Other Ambulatory Visit: Payer: Self-pay | Admitting: *Deleted

## 2011-05-11 DIAGNOSIS — R945 Abnormal results of liver function studies: Secondary | ICD-10-CM

## 2011-05-12 LAB — MITOCHONDRIAL ANTIBODIES: Mitochondrial M2 Ab, IgG: 0.15 (ref <0.91)

## 2011-05-13 ENCOUNTER — Other Ambulatory Visit: Payer: Self-pay | Admitting: *Deleted

## 2011-05-18 ENCOUNTER — Other Ambulatory Visit (INDEPENDENT_AMBULATORY_CARE_PROVIDER_SITE_OTHER): Payer: Federal, State, Local not specified - PPO

## 2011-05-18 DIAGNOSIS — R7989 Other specified abnormal findings of blood chemistry: Secondary | ICD-10-CM

## 2011-05-18 DIAGNOSIS — R945 Abnormal results of liver function studies: Secondary | ICD-10-CM

## 2011-05-18 LAB — HEPATIC FUNCTION PANEL
ALT: 19 U/L (ref 0–53)
Albumin: 4.6 g/dL (ref 3.5–5.2)
Alkaline Phosphatase: 69 U/L (ref 39–117)
Bilirubin, Direct: 0.1 mg/dL (ref 0.0–0.3)
Total Protein: 6.8 g/dL (ref 6.0–8.3)

## 2011-05-20 ENCOUNTER — Other Ambulatory Visit: Payer: Federal, State, Local not specified - PPO

## 2011-05-23 LAB — COPPER, URINE, 24 HOUR: Total Volume - CURRI: 3000 mL

## 2011-06-09 ENCOUNTER — Encounter: Payer: Self-pay | Admitting: Internal Medicine

## 2011-08-11 ENCOUNTER — Telehealth: Payer: Self-pay | Admitting: *Deleted

## 2011-08-11 NOTE — Telephone Encounter (Signed)
Message copied by Richardson Chiquito on Tue Aug 11, 2011  8:13 AM ------      Message from: Richardson Chiquito      Created: Fri May 08, 2011  1:58 PM       Did pt have lfts on 08/07/11???

## 2011-08-11 NOTE — Telephone Encounter (Signed)
I have advised patient that he is due for repeat labwork. Patient states that he will come tomorrow for it.

## 2011-08-12 ENCOUNTER — Other Ambulatory Visit (INDEPENDENT_AMBULATORY_CARE_PROVIDER_SITE_OTHER): Payer: Federal, State, Local not specified - PPO

## 2011-08-12 ENCOUNTER — Telehealth: Payer: Self-pay | Admitting: *Deleted

## 2011-08-12 ENCOUNTER — Encounter: Payer: Self-pay | Admitting: Internal Medicine

## 2011-08-12 ENCOUNTER — Ambulatory Visit (AMBULATORY_SURGERY_CENTER): Payer: Federal, State, Local not specified - PPO | Admitting: *Deleted

## 2011-08-12 VITALS — Ht 69.0 in | Wt 154.7 lb

## 2011-08-12 DIAGNOSIS — Z1211 Encounter for screening for malignant neoplasm of colon: Secondary | ICD-10-CM

## 2011-08-12 DIAGNOSIS — Z8371 Family history of colonic polyps: Secondary | ICD-10-CM

## 2011-08-12 DIAGNOSIS — R7989 Other specified abnormal findings of blood chemistry: Secondary | ICD-10-CM

## 2011-08-12 LAB — HEPATIC FUNCTION PANEL
ALT: 17 U/L (ref 0–53)
AST: 18 U/L (ref 0–37)
Albumin: 4.5 g/dL (ref 3.5–5.2)
Alkaline Phosphatase: 74 U/L (ref 39–117)
Total Protein: 6.9 g/dL (ref 6.0–8.3)

## 2011-08-12 MED ORDER — MOVIPREP 100 G PO SOLR: ORAL | Status: DC

## 2011-08-12 NOTE — Telephone Encounter (Signed)
Message copied by Richardson Chiquito on Wed Aug 12, 2011  5:11 PM ------      Message from: Hart Carwin      Created: Wed Aug 12, 2011  5:08 PM       Please call pt with normal liver tests. No follow up necessary at this point since it has been 6 months.

## 2011-08-12 NOTE — Telephone Encounter (Signed)
Patient advised of normal labs and that no follow up labwork is needed at this time since his tests have been normal x 6 months.

## 2011-08-25 ENCOUNTER — Ambulatory Visit (AMBULATORY_SURGERY_CENTER): Payer: Federal, State, Local not specified - PPO | Admitting: Internal Medicine

## 2011-08-25 ENCOUNTER — Encounter: Payer: Self-pay | Admitting: Internal Medicine

## 2011-08-25 VITALS — BP 123/72 | HR 60 | Temp 97.7°F | Resp 20 | Ht 69.0 in | Wt 154.0 lb

## 2011-08-25 DIAGNOSIS — Z1211 Encounter for screening for malignant neoplasm of colon: Secondary | ICD-10-CM

## 2011-08-25 DIAGNOSIS — Z8371 Family history of colonic polyps: Secondary | ICD-10-CM

## 2011-08-25 MED ORDER — HYDROCORTISONE ACETATE 25 MG RE SUPP: 25.0000 mg | Freq: Two times a day (BID) | RECTAL | Status: DC | PRN

## 2011-08-25 MED ORDER — SODIUM CHLORIDE 0.9 % IV SOLN: 500.0000 mL | INTRAVENOUS | Status: DC

## 2011-08-25 NOTE — Patient Instructions (Addendum)
YOU HAD AN ENDOSCOPIC PROCEDURE TODAY AT THE [facility]: Refer to the procedure report that was given to you for any specific questions about what was found during the examination.  If the procedure report does not answer your questions, please call your gastroenterologist to clarify.  If you requested that your care partner not be given the details of your procedure findings, then the procedure report has been included in a sealed envelope for you to review at your convenience later.  YOU SHOULD EXPECT: Some feelings of bloating in the abdomen. Passage of more gas than usual.  Walking can help get rid of the air that was put into your GI tract during the procedure and reduce the bloating. If you had a lower endoscopy (such as a colonoscopy or flexible sigmoidoscopy) you may notice spotting of blood in your stool or on the toilet paper. If you underwent a bowel prep for your procedure, then you may not have a normal bowel movement for a few days.  DIET: Your first meal following the procedure should be a light meal and then it is ok to progress to your normal diet.  A half-sandwich or bowl of soup is an example of a good first meal.  Heavy or fried foods are harder to digest and may make you feel nauseous or bloated.  Likewise meals heavy in dairy and vegetables can cause extra gas to form and this can also increase the bloating.  Drink plenty of fluids but you should avoid alcoholic beverages for 24 hours.  ACTIVITY: Your care partner should take you home directly after the procedure.  You should plan to take it easy, moving slowly for the rest of the day.  You can resume normal activity the day after the procedure however you should NOT DRIVE or use heavy machinery for 24 hours (because of the sedation medicines used during the test).    SYMPTOMS TO REPORT IMMEDIATELY: A gastroenterologist can be reached at any hour.  During normal business hours, 8:30 AM to 5:00 PM Monday through Friday,  call [phone].  After hours and on weekends, please call the GI answering service at [phone] who will take a message and have the physician on call contact you.   Following lower endoscopy (colonoscopy or flexible sigmoidoscopy):  Excessive amounts of blood in the stool  Significant tenderness or worsening of abdominal pains  Swelling of the abdomen that is new, acute  Fever of 100F or higher  Fever of 100F or higher  Black, tarry-looking stools  FOLLOW UP: Our staff will call the home number listed on your records the next business day following your procedure to check on you and address any questions or concerns that you may have at that time regarding the information given to you following your procedure. This is a courtesy call and so if there is no answer at the home number and we have not heard from you through the emergency physician on call, we will assume that you have returned to your regular daily activities without incident.  SIGNATURES/CONFIDENTIALITY: You and/or your care partner have signed paperwork which will be entered into your electronic medical record.  These signatures attest to the fact that that the information above on your After Visit Summary has been reviewed and is understood.  Full responsibility of the confidentiality of this discharge information lies with you and/or your care-partner.   Resume your normal medications  Follow a high fiber diet- see handout  Follow up  colonoscopy in 10 years

## 2011-08-25 NOTE — Op Note (Signed)
[facility] [address] [place], [place]  [address]  COLONOSCOPY PROCEDURE REPORT  PATIENT:  John [name], John [name]  MR#:  [id number] BIRTHDATE:  Oct 02, 1950, 61 yrs. old  GENDER:  male ENDOSCOPIST:  Hedwig Morton. Juanda Chance, MD REF. BY:  Eleonore Chiquito, M.D. PROCEDURE DATE:  08/25/2011 PROCEDURE:  Colonoscopy 95284 ASA CLASS:  Class I INDICATIONS:  colorectal cancer screening, average risk, family Hx of polyps lasy colon 2006, MEDICATIONS:   MAC sedation, administered by CRNA, propofol (Diprivan) 250 mg  DESCRIPTION OF PROCEDURE:   After the risks and benefits and of the procedure were explained, informed consent was obtained. Digital rectal exam was performed and revealed no rectal masses. The LB CF-[id number] endoscope was introduced through the anus and advanced to the cecum, which was identified by both the appendix and ileocecal valve.  The quality of the prep was good, using MoviPrep.  The instrument was then slowly withdrawn as the colon was fully examined. <<PROCEDUREIMAGES>>  FINDINGS:  No polyps or cancers were seen (see image1, image2, image3, and image4).   Retroflexed views in the rectum revealed no abnormalities.    The scope was then withdrawn from the patient and the procedure completed.  COMPLICATIONS:  None ENDOSCOPIC IMPRESSION: 1) No polyps or cancers 2) Normal colonoscopy RECOMMENDATIONS: 1) High fiber diet.  REPEAT EXAM:  In 10 year(s) for.  ______________________________ Hedwig Morton. Juanda Chance, MD  CC:  n. eSIGNED:   Hedwig Morton. Ekin Pilar at 08/25/2011 12:18 PM  Liam Graham, [phone]

## 2011-08-25 NOTE — Progress Notes (Addendum)
RX printed and given to pt  Patient did not experience any of the following events: a burn prior to discharge; a fall within the facility; wrong site/side/patient/procedure/implant event; or a hospital transfer or hospital admission upon discharge from the facility. [phone]) Patient did not have preoperative order for IV antibiotic SSI prophylaxis. [phone])

## 2011-08-26 ENCOUNTER — Telehealth: Payer: Self-pay | Admitting: *Deleted

## 2011-08-26 NOTE — Telephone Encounter (Signed)
  Follow up Call-  Call back number 08/25/2011  Post procedure Call Back phone  # [phone]  Permission to leave phone message Yes     Patient questions:  Do you have a fever, pain , or abdominal swelling? no Pain Score  0 *  Have you tolerated food without any problems? yes  Have you been able to return to your normal activities? yes  Do you have any questions about your discharge instructions: Diet   no Medications  no Follow up visit  no  Do you have questions or concerns about your Care? no  Actions: * If pain score is 4 or above: No action needed, pain <4.

## 2011-08-27 ENCOUNTER — Other Ambulatory Visit (INDEPENDENT_AMBULATORY_CARE_PROVIDER_SITE_OTHER): Payer: Federal, State, Local not specified - PPO

## 2011-08-27 DIAGNOSIS — Z0000 Encounter for general adult medical examination without abnormal findings: Secondary | ICD-10-CM

## 2011-08-27 LAB — BASIC METABOLIC PANEL
Chloride: 105 mEq/L (ref 96–112)
GFR: 139.05 mL/min (ref >60.00)
Potassium: 4.3 mEq/L (ref 3.5–5.1)
Sodium: 141 mEq/L (ref 135–145)

## 2011-08-27 LAB — POCT URINALYSIS DIPSTICK
Bilirubin, UA: NEGATIVE
Blood, UA: NEGATIVE
Glucose, UA: NEGATIVE
Spec Grav, UA: 1.025
pH, UA: 5.5

## 2011-08-27 LAB — HEPATIC FUNCTION PANEL
Albumin: 4.2 g/dL (ref 3.5–5.2)
Bilirubin, Direct: 0.1 mg/dL (ref 0.0–0.3)
Total Protein: 6.3 g/dL (ref 6.0–8.3)

## 2011-08-27 LAB — CBC WITH DIFFERENTIAL/PLATELET
Basophils Relative: 0.6 % (ref 0.0–3.0)
Eosinophils Relative: 2.1 % (ref 0.0–5.0)
HCT: 41.9 % (ref 39.0–52.0)
Hemoglobin: 14.1 g/dL (ref 13.0–17.0)
Lymphs Abs: 2 10*3/uL (ref 0.7–4.0)
MCV: 93.4 fl (ref 78.0–100.0)
Monocytes Relative: 9.4 % (ref 3.0–12.0)
Neutro Abs: 2.6 10*3/uL (ref 1.4–7.7)
WBC: 5.3 10*3/uL (ref 4.5–10.5)

## 2011-08-27 LAB — LIPID PANEL
Cholesterol: 182 mg/dL (ref 0–200)
HDL: 50.9 mg/dL (ref >39.00)
Triglycerides: 63 mg/dL (ref 0.0–149.0)

## 2011-08-28 ENCOUNTER — Telehealth: Payer: Self-pay | Admitting: Internal Medicine

## 2011-08-28 NOTE — Telephone Encounter (Signed)
I have given a verbal order to pharmacy for anusol suppositories since they did not get the electronic script sent by Dr Juanda Chance. Patient advised.

## 2011-09-04 ENCOUNTER — Telehealth: Payer: Self-pay | Admitting: *Deleted

## 2011-09-04 NOTE — Telephone Encounter (Signed)
Left a message for patient to call me. 

## 2011-09-04 NOTE — Telephone Encounter (Signed)
Message copied by Daphine Deutscher on Fri Sep 04, 2011 10:16 AM ------      Message from: Daphine Deutscher      Created: Wed Aug 19, 2011  3:32 PM       Call and schedule OV with DB in sept 2013 and LFT prior to OV

## 2011-09-07 NOTE — Telephone Encounter (Signed)
Patient scheduled for OV on 10/20/11 at 3:45 PM. Patient states he had LFT on 08/27/11(normal) and wants to know if he needs LFT prior to OV. Please, advise.

## 2011-09-07 NOTE — Telephone Encounter (Signed)
I just saw him for colonoscopy. I prefer not to see him so soon. Please reschedule for November 2013 and have LFT's checked then.

## 2011-09-07 NOTE — Telephone Encounter (Signed)
Spoke with patient and cancelled Appt. Note in EPIC for Nov appt.

## 2011-09-08 ENCOUNTER — Encounter: Payer: Self-pay | Admitting: Internal Medicine

## 2011-09-08 ENCOUNTER — Ambulatory Visit (INDEPENDENT_AMBULATORY_CARE_PROVIDER_SITE_OTHER): Payer: Federal, State, Local not specified - PPO | Admitting: Internal Medicine

## 2011-09-08 VITALS — BP 110/80 | HR 66 | Temp 98.1°F | Resp 20 | Ht 69.5 in | Wt 157.0 lb

## 2011-09-08 DIAGNOSIS — Z0000 Encounter for general adult medical examination without abnormal findings: Secondary | ICD-10-CM

## 2011-09-08 NOTE — Patient Instructions (Signed)
Return in one year for follow-up    It is important that you exercise regularly, at least 20 minutes 3 to 4 times per week.  If you develop chest pain or shortness of breath seek  medical attention.   

## 2011-09-08 NOTE — Progress Notes (Signed)
  Subjective:    Patient ID: John [name], male    DOB: 26-May-1950, 61 y.o.   MRN: [id number]  HPI 61 -year-old patient who is seen today for an annual exam. He does remarkably well and is on no chronic medications. Complaints include some occasional right shoulder pain. He has some difficulty with the external hemorrhoids. Finally he complains of some fatigue that usually resolves with the cooler fall weather.  He has had a recent colonoscopy    Review of Systems  Constitutional: Positive for fatigue. Negative for fever, chills, activity change and appetite change.  HENT: Negative for hearing loss, ear pain, congestion, rhinorrhea, sneezing, mouth sores, trouble swallowing, neck pain, neck stiffness, dental problem, voice change, sinus pressure and tinnitus.   Eyes: Negative for photophobia, pain, redness and visual disturbance.  Respiratory: Negative for apnea, cough, choking, chest tightness, shortness of breath and wheezing.   Cardiovascular: Negative for chest pain, palpitations and leg swelling.  Gastrointestinal: Negative for nausea, vomiting, abdominal pain, diarrhea, constipation, blood in stool, abdominal distention, anal bleeding and rectal pain.  Genitourinary: Negative for dysuria, urgency, frequency, hematuria, flank pain, decreased urine volume, discharge, penile swelling, scrotal swelling, difficulty urinating, genital sores and testicular pain.  Musculoskeletal: Negative for myalgias, back pain, joint swelling, arthralgias and gait problem.  Skin: Negative for color change, rash and wound.  Neurological: Negative for dizziness, tremors, seizures, syncope, facial asymmetry, speech difficulty, weakness, light-headedness, numbness and headaches.  Hematological: Negative for adenopathy. Does not bruise/bleed easily.  Psychiatric/Behavioral: Negative for suicidal ideas, hallucinations, behavioral problems, confusion, disturbed wake/sleep cycle, self-injury, dysphoric mood, decreased  concentration and agitation. The patient is not nervous/anxious.        Objective:   Physical Exam  Constitutional: He appears well-developed and well-nourished.  HENT:  Head: Normocephalic and atraumatic.  Right Ear: External ear normal.  Left Ear: External ear normal.  Nose: Nose normal.  Mouth/Throat: Oropharynx is clear and moist.  Eyes: Conjunctivae and EOM are normal. Pupils are equal, round, and reactive to light. No scleral icterus.  Neck: Normal range of motion. Neck supple. No JVD present. No thyromegaly present.  Cardiovascular: Regular rhythm, normal heart sounds and intact distal pulses.  Exam reveals no gallop and no friction rub.   No murmur heard. Pulmonary/Chest: Effort normal and breath sounds normal. He exhibits no tenderness.  Abdominal: Soft. Bowel sounds are normal. He exhibits no distension and no mass. There is no tenderness.  Genitourinary: Penis normal.       Prostate again was asymmetric with more prominent left lobe. Stool hematest negative. No obvious hemorrhoidal disease  Musculoskeletal: Normal range of motion. He exhibits no edema and no tenderness.  Lymphadenopathy:    He has no cervical adenopathy.  Neurological: He is alert. He has normal reflexes. No cranial nerve deficit. Coordination normal.  Skin: Skin is warm and dry. No rash noted.  Psychiatric: He has a normal mood and affect. His behavior is normal.          Assessment & Plan:   Preventive health examination. We'll consider orthopedic or general surgery referral if his right shoulder pain her hemorrhoidal disease worsens Return in one year for followup

## 2011-10-13 ENCOUNTER — Ambulatory Visit: Payer: Federal, State, Local not specified - PPO | Admitting: Internal Medicine

## 2011-10-13 DIAGNOSIS — Z23 Encounter for immunization: Secondary | ICD-10-CM

## 2011-10-13 DIAGNOSIS — Z0289 Encounter for other administrative examinations: Secondary | ICD-10-CM

## 2011-10-14 ENCOUNTER — Telehealth: Payer: Self-pay | Admitting: Internal Medicine

## 2011-10-14 NOTE — Telephone Encounter (Signed)
Caller: [name]/Patient; Patient Name: John [name]; PCP: Eleonore Chiquito [facility]); Best Callback Phone Number: [phone]; Reason for call: Reports he rec'd flu and pneumonia vaccines on 10/13/11.  Arm feels sore and difficulty lifting arm above head due to pain on 10/13/11.  Took Acetaminophen, did not sleep well.  Pain has reduced since then.  Emergent symptoms ruled out. Home care and parameters for callback given.  Reviewed information related to possible reactions for this vaccine in Health Education files.  Caller voiced understanding of same.  Abrasions, lacerations, Puncture Wounds protocol used.

## 2011-10-20 ENCOUNTER — Ambulatory Visit: Payer: Federal, State, Local not specified - PPO | Admitting: Internal Medicine

## 2011-11-02 ENCOUNTER — Telehealth: Payer: Self-pay | Admitting: *Deleted

## 2011-11-02 NOTE — Telephone Encounter (Signed)
Left a message for patient to call me. 

## 2011-11-02 NOTE — Telephone Encounter (Signed)
Message copied by Daphine Deutscher on Mon Nov 02, 2011  1:41 PM ------      Message from: Daphine Deutscher      Created: Mon Sep 07, 2011  3:16 PM       Call and schedule LFt and OV for Nov 2013 with DB

## 2011-11-04 NOTE — Telephone Encounter (Signed)
Left a message for patient to call me. 

## 2011-11-06 ENCOUNTER — Encounter: Payer: Self-pay | Admitting: *Deleted

## 2011-11-06 NOTE — Telephone Encounter (Signed)
Mailed patient a letter to call to make OV.

## 2011-12-03 ENCOUNTER — Other Ambulatory Visit: Payer: Self-pay | Admitting: *Deleted

## 2011-12-03 DIAGNOSIS — R945 Abnormal results of liver function studies: Secondary | ICD-10-CM

## 2012-01-15 ENCOUNTER — Other Ambulatory Visit (INDEPENDENT_AMBULATORY_CARE_PROVIDER_SITE_OTHER): Payer: Federal, State, Local not specified - PPO

## 2012-01-15 DIAGNOSIS — R7989 Other specified abnormal findings of blood chemistry: Secondary | ICD-10-CM

## 2012-01-15 DIAGNOSIS — R945 Abnormal results of liver function studies: Secondary | ICD-10-CM

## 2012-01-15 LAB — HEPATIC FUNCTION PANEL
ALT: 21 U/L (ref 0–53)
AST: 22 U/L (ref 0–37)
Alkaline Phosphatase: 52 U/L (ref 39–117)
Bilirubin, Direct: 0.2 mg/dL (ref 0.0–0.3)
Total Bilirubin: 1.1 mg/dL (ref 0.3–1.2)

## 2012-01-18 ENCOUNTER — Other Ambulatory Visit: Payer: Self-pay | Admitting: *Deleted

## 2012-01-18 DIAGNOSIS — R945 Abnormal results of liver function studies: Secondary | ICD-10-CM

## 2012-01-19 ENCOUNTER — Encounter: Payer: Self-pay | Admitting: Internal Medicine

## 2012-01-19 ENCOUNTER — Ambulatory Visit (INDEPENDENT_AMBULATORY_CARE_PROVIDER_SITE_OTHER): Payer: Federal, State, Local not specified - PPO | Admitting: Internal Medicine

## 2012-01-19 VITALS — BP 110/70 | HR 62 | Ht 69.75 in | Wt 159.6 lb

## 2012-01-19 NOTE — Progress Notes (Signed)
John [name] 08/07/1950 MRN [id number]        History of Present Illness:  This is a 61 year old white male with abnormal liver function tests. Last appointment April 2013. We initially saw him in Feb 2013 after 2 episodes of the abdominal pain ,fever, jaundice and a rash, suggestive of cholangitis. His liver function tests have since then  normalized and his MRCP was completely normal, he has had 6 sets of liver function tests since April of this year and all of them are  normal ,including last week.LFT's. His level of energy has been good and  he denies any fever sweats or weight changes .He is up-to-date on his colonoscopy. Last exam July 2013, there is a family history of colon polyps   Past Medical History  Diagnosis Date  . Hemorrhoids     symptomatic  . Prostate nodule     left  . Abnormal liver function test   . History of hepatitis A    Past Surgical History  Procedure Date  . Tonsillectomy   . Colonoscopy     reports that he has never smoked. He has never used smokeless tobacco. He reports that he drinks about 8.4 ounces of alcohol per week. He reports that he does not use illicit drugs. family history includes Breast cancer in his mother; Colon polyps in his father; Heart attack in his father; Heart disease in his brother and mother; Parkinsonism in his mother; and Prostate cancer in his maternal aunt.  There is no history of Colon cancer, and Esophageal cancer, and Stomach cancer, . No Known Allergies      Review of Systems: Denies rectal bleeding weight loss fever  The remainder of the 10 point ROS is negative except as outlined in H&P   Physical Exam: General appearance  Well developed, in no distress. Eyes- non icteric. HEENT nontraumatic, normocephalic. Mouth no lesions, tongue papillated, no cheilosis. Neck supple without adenopathy, thyroid not enlarged, no carotid bruits, no JVD. Lungs Clear to auscultation bilaterally. Cor normal S1, normal S2,  regular rhythm, no murmur,  quiet precordium. Abdomen: Soft nontender abdomen with liver edge 1-2 cm below right costal margin it is soft and nontender. There is no ascites and no distention Rectal: Not done Extremities no pedal edema. Skin no lesions., no stigmata of chronic liver disease Neurological alert and oriented x 3. Psychological normal mood and affect.  Assessment and Plan:  Complete normalization of abnormal liver function tests . Hx of 2 episodes of abdominal pain and fever almost a year ago consistent with cholangitis, but the biliary anatomy was normal on the MRCP imaging.. In the last 10 months he has had completely normal liver enzymes and normal biliary and pancreatic anatomy by MRCP. Since there is no evidence of gallbladder disease or malignant process we will wait a year before repeating his liver function tests. I would like to see him in the office again in one year and he will call us if he develops another episodes of abdominal pain . We discussed a possibility of laparoscopic cholecystectomy but  in the setting of normal liver tests and absence of any symptoms I would not lean toward laparoscopic cholecystectomy   01/19/2012 John [name]

## 2012-01-19 NOTE — Patient Instructions (Addendum)
You will follow up in 1 year with Dr Welford Roche will need labs drawn before that appointment Repeat liver tests in 1 year before you see me. Dr Amador Cunas

## 2012-09-02 ENCOUNTER — Other Ambulatory Visit (INDEPENDENT_AMBULATORY_CARE_PROVIDER_SITE_OTHER): Payer: Federal, State, Local not specified - PPO

## 2012-09-02 DIAGNOSIS — Z0000 Encounter for general adult medical examination without abnormal findings: Secondary | ICD-10-CM

## 2012-09-02 LAB — LIPID PANEL
Cholesterol: 176 mg/dL (ref 0–200)
LDL Cholesterol: 111 mg/dL — ABNORMAL HIGH (ref 0–99)
Triglycerides: 69 mg/dL (ref 0.0–149.0)
VLDL: 13.8 mg/dL (ref 0.0–40.0)

## 2012-09-02 LAB — HEPATIC FUNCTION PANEL
ALT: 17 U/L (ref 0–53)
AST: 18 U/L (ref 0–37)
Alkaline Phosphatase: 59 U/L (ref 39–117)
Total Bilirubin: 0.8 mg/dL (ref 0.3–1.2)

## 2012-09-02 LAB — POCT URINALYSIS DIPSTICK
Blood, UA: NEGATIVE
Glucose, UA: NEGATIVE
Ketones, UA: NEGATIVE
Spec Grav, UA: 1.015

## 2012-09-02 LAB — CBC WITH DIFFERENTIAL/PLATELET
Basophils Absolute: 0 10*3/uL (ref 0.0–0.1)
Eosinophils Relative: 2.2 % (ref 0.0–5.0)
HCT: 42.8 % (ref 39.0–52.0)
Lymphs Abs: 1 10*3/uL (ref 0.7–4.0)
Monocytes Absolute: 0.8 10*3/uL (ref 0.1–1.0)
Monocytes Relative: 17.7 % — ABNORMAL HIGH (ref 3.0–12.0)
Neutrophils Relative %: 58 % (ref 43.0–77.0)
Platelets: 157 10*3/uL (ref 150.0–400.0)
RDW: 14 % (ref 11.5–14.6)
WBC: 4.8 10*3/uL (ref 4.5–10.5)

## 2012-09-02 LAB — BASIC METABOLIC PANEL
CO2: 29 mEq/L (ref 19–32)
Calcium: 9.5 mg/dL (ref 8.4–10.5)
Chloride: 103 mEq/L (ref 96–112)
Sodium: 138 mEq/L (ref 135–145)

## 2012-09-02 LAB — TSH: TSH: 1.64 u[IU]/mL (ref 0.35–5.50)

## 2012-09-09 ENCOUNTER — Encounter: Payer: Self-pay | Admitting: Internal Medicine

## 2012-09-09 ENCOUNTER — Ambulatory Visit (INDEPENDENT_AMBULATORY_CARE_PROVIDER_SITE_OTHER): Payer: Federal, State, Local not specified - PPO | Admitting: Internal Medicine

## 2012-09-09 VITALS — BP 100/66 | HR 57 | Temp 98.0°F | Resp 18 | Ht 69.0 in | Wt 158.0 lb

## 2012-09-09 DIAGNOSIS — Z2911 Encounter for prophylactic immunotherapy for respiratory syncytial virus (RSV): Secondary | ICD-10-CM

## 2012-09-09 DIAGNOSIS — Z0000 Encounter for general adult medical examination without abnormal findings: Secondary | ICD-10-CM

## 2012-09-09 DIAGNOSIS — Z23 Encounter for immunization: Secondary | ICD-10-CM

## 2012-09-09 NOTE — Patient Instructions (Signed)
It is important that you exercise regularly, at least 20 minutes 3 to 4 times per week.  If you develop chest pain or shortness of breath seek  medical attention.  Return in one year for follow-up   

## 2012-09-09 NOTE — Progress Notes (Signed)
Patient ID: RUI WORDELL, male   DOB: [birth year], 63 y.o.   MRN: [id number]  Subjective:    Patient ID: TAYTON DECAIRE, male    DOB: Sep 30, 1950, 62 y.o.   MRN: [id number]  HPI  74 -year-old patient who is seen today for an annual exam. He does remarkably well and is on no chronic medications.  Past Medical History  Diagnosis Date  . Hemorrhoids     symptomatic  . Prostate nodule     left  . Abnormal liver function test   . History of hepatitis A     History   Social History  . Marital Status: Married    Spouse Name: N/A    Number of Children: N/A  . Years of Education: N/A   Occupational History  . Not on file.   Social History Main Topics  . Smoking status: Never Smoker   . Smokeless tobacco: Never Used  . Alcohol Use: 8.4 oz/week    14 Cans of beer per week     Comment: 2 daily   . Drug Use: No  . Sexually Active: Not on file   Other Topics Concern  . Not on file   Social History Narrative   No caffeine     Past Surgical History  Procedure Laterality Date  . Tonsillectomy    . Colonoscopy      Family History  Problem Relation Age of Onset  . Breast cancer Mother   . Heart disease Mother     CHF  . Parkinsonism Mother   . Heart attack Father   . Colon polyps Father   . Heart disease Brother     post CABG  . Colon cancer Neg Hx   . Esophageal cancer Neg Hx   . Stomach cancer Neg Hx   . Prostate cancer Maternal Aunt     No Known Allergies  Current Outpatient Prescriptions on File Prior to Visit  Medication Sig Dispense Refill  . Casanthranol-Docusate Sodium 30-100 MG CAPS Take 1 capsule by mouth daily as needed.       . Probiotic Product (PROBIOTIC DAILY PO) Take 1 capsule by mouth daily.       No current facility-administered medications on file prior to visit.    BP 100/66  Pulse 57  Temp(Src) 98 F (36.7 C) (Oral)  Resp 18  Ht 5\' 9"  (1.753 m)  Wt 158 lb (71.668 kg)  BMI 23.32 kg/m2  SpO2 97%      Review of Systems   Constitutional: Negative for fever, chills, activity change, appetite change and fatigue.  HENT: Negative for hearing loss, ear pain, congestion, rhinorrhea, sneezing, mouth sores, trouble swallowing, neck pain, neck stiffness, dental problem, voice change, sinus pressure and tinnitus.   Eyes: Negative for photophobia, pain, redness and visual disturbance.  Respiratory: Negative for apnea, cough, choking, chest tightness, shortness of breath and wheezing.   Cardiovascular: Negative for chest pain, palpitations and leg swelling.  Gastrointestinal: Negative for nausea, vomiting, abdominal pain, diarrhea, constipation, blood in stool, abdominal distention, anal bleeding and rectal pain.  Genitourinary: Negative for dysuria, urgency, frequency, hematuria, flank pain, decreased urine volume, discharge, penile swelling, scrotal swelling, difficulty urinating, genital sores and testicular pain.  Musculoskeletal: Negative for myalgias, back pain, joint swelling, arthralgias and gait problem.  Skin: Negative for color change, rash and wound.  Neurological: Negative for dizziness, tremors, seizures, syncope, facial asymmetry, speech difficulty, weakness, light-headedness, numbness and headaches.  Hematological: Negative for adenopathy. Does not bruise/bleed easily.  Psychiatric/Behavioral: Negative for suicidal ideas, hallucinations, behavioral problems, confusion, sleep disturbance, self-injury, dysphoric mood, decreased concentration and agitation. The patient is not nervous/anxious.        Objective:   Physical Exam  Constitutional: He appears well-developed and well-nourished.  HENT:  Head: Normocephalic and atraumatic.  Right Ear: External ear normal.  Left Ear: External ear normal.  Nose: Nose normal.  Mouth/Throat: Oropharynx is clear and moist.  Eyes: Conjunctivae and EOM are normal. Pupils are equal, round, and reactive to light. No scleral icterus.  Neck: Normal range of motion. Neck  supple. No JVD present. No thyromegaly present.  Cardiovascular: Regular rhythm, normal heart sounds and intact distal pulses.  Exam reveals no gallop and no friction rub.   No murmur heard. Pulmonary/Chest: Effort normal and breath sounds normal. He exhibits no tenderness.  Abdominal: Soft. Bowel sounds are normal. He exhibits no distension and no mass. There is no tenderness.  Genitourinary: Penis normal.  Musculoskeletal: Normal range of motion. He exhibits no edema and no tenderness.  Lymphadenopathy:    He has no cervical adenopathy.  Neurological: He is alert. He has normal reflexes. No cranial nerve deficit. Coordination normal.  Skin: Skin is warm and dry. No rash noted.  Psychiatric: He has a normal mood and affect. His behavior is normal.          Assessment & Plan:   Preventive health examination.  Return in one year for followup

## 2012-11-08 ENCOUNTER — Encounter: Payer: Self-pay | Admitting: Internal Medicine

## 2012-11-08 ENCOUNTER — Ambulatory Visit (INDEPENDENT_AMBULATORY_CARE_PROVIDER_SITE_OTHER): Payer: Federal, State, Local not specified - PPO | Admitting: Internal Medicine

## 2012-11-08 VITALS — BP 100/70 | HR 61 | Temp 97.8°F | Resp 20 | Wt 158.0 lb

## 2012-11-08 DIAGNOSIS — Z23 Encounter for immunization: Secondary | ICD-10-CM

## 2012-11-08 DIAGNOSIS — J069 Acute upper respiratory infection, unspecified: Secondary | ICD-10-CM

## 2012-11-08 NOTE — Patient Instructions (Signed)
Acute bronchitis symptoms for less than 10 days are generally not helped by antibiotics.  Take over-the-counter expectorants and cough medications such as  Mucinex DM.  Call if there is no improvement in 5 to 7 days or if he developed worsening cough, fever, or new symptoms, such as shortness of breath or chest pain.  Take 400-600 mg of ibuprofen ( Advil, Motrin) with food every 4 to 6 hours as needed for pain relief or control of fever

## 2012-11-08 NOTE — Progress Notes (Signed)
Subjective:    Patient ID: John [name], male    DOB: 08-25-1950, 62 y.o.   MRN: [id number]  HPI  62 year old patient who presents with a 2 week history of hoarseness sore throat and now mildly productive cough. He has some mild general malaise but no fever chills shortness of breath or chest pain. He has been using Allegra-D. His hoarseness has improved  Past Medical History  Diagnosis Date  . Hemorrhoids     symptomatic  . Prostate nodule     left  . Abnormal liver function test   . History of hepatitis A     History   Social History  . Marital Status: Married    Spouse Name: N/A    Number of Children: N/A  . Years of Education: N/A   Occupational History  . Not on file.   Social History Main Topics  . Smoking status: Never Smoker   . Smokeless tobacco: Never Used  . Alcohol Use: 8.4 oz/week    14 Cans of beer per week     Comment: 2 daily   . Drug Use: No  . Sexual Activity: Not on file   Other Topics Concern  . Not on file   Social History Narrative   No caffeine     Past Surgical History  Procedure Laterality Date  . Tonsillectomy    . Colonoscopy      Family History  Problem Relation Age of Onset  . Breast cancer Mother   . Heart disease Mother     CHF  . Parkinsonism Mother   . Heart attack Father   . Colon polyps Father   . Heart disease Brother     post CABG  . Colon cancer Neg Hx   . Esophageal cancer Neg Hx   . Stomach cancer Neg Hx   . Prostate cancer Maternal Aunt     No Known Allergies  Current Outpatient Prescriptions on File Prior to Visit  Medication Sig Dispense Refill  . Casanthranol-Docusate Sodium 30-100 MG CAPS Take 1 capsule by mouth daily as needed.       . Probiotic Product (PROBIOTIC DAILY PO) Take 1 capsule by mouth daily.       No current facility-administered medications on file prior to visit.    BP 100/70  Pulse 61  Temp(Src) 97.8 F (36.6 C) (Oral)  Resp 20  Wt 158 lb (71.668 kg)  BMI 23.32 kg/m2   SpO2 98%       Review of Systems  Constitutional: Positive for activity change, appetite change and fatigue.  HENT: Positive for congestion, sore throat and voice change.   Respiratory: Positive for cough.        Objective:   Physical Exam  Constitutional: He is oriented to person, place, and time. He appears well-developed.  HENT:  Head: Normocephalic.  Right Ear: External ear normal.  Left Ear: External ear normal.  Oropharynx appears normal  Eyes: Conjunctivae and EOM are normal.  Neck: Normal range of motion.  Cardiovascular: Normal rate and normal heart sounds.   Pulmonary/Chest: Breath sounds normal. No respiratory distress. He has no wheezes. He has no rales.  Abdominal: Bowel sounds are normal.  Musculoskeletal: Normal range of motion. He exhibits no edema and no tenderness.  Lymphadenopathy:    He has no cervical adenopathy.  Neurological: He is alert and oriented to person, place, and time.  Psychiatric: He has a normal mood and affect. His behavior is normal.  Assessment & Plan:   Viral URI with cough. We'll treat symptomatically

## 2012-12-20 ENCOUNTER — Other Ambulatory Visit: Payer: Self-pay | Admitting: Internal Medicine

## 2012-12-22 ENCOUNTER — Telehealth: Payer: Self-pay | Admitting: *Deleted

## 2012-12-22 NOTE — Telephone Encounter (Signed)
Message copied by Richardson Chiquito on Thu Dec 22, 2012 10:13 AM ------      Message from: Richardson Chiquito      Created: Tue Dec 20, 2012 10:56 AM       I have attempted to reach patient. No answer and no voicemail. Will call back at a later time. He is already scheduled for an appointment. He just needs to be advised of this as well as to come for labs prior to the appointment.      ----- Message -----         From: Richardson Chiquito, CMA         Sent: 12/20/2012           To: Richardson Chiquito, CMA            Pt needs repeat lefts and ov around 01/2013... See office note dated 01/19/12. Needs labs BEFORE appt. Call patient and schedule       ------

## 2012-12-22 NOTE — Telephone Encounter (Signed)
I have spoken to patient and he verbalizes understanding of date and time of appt as well as his need to come for labs prior to his appointment.

## 2013-01-03 ENCOUNTER — Telehealth: Payer: Self-pay | Admitting: *Deleted

## 2013-01-03 NOTE — Telephone Encounter (Signed)
Message copied by Daphine Deutscher on Tue Jan 03, 2013  9:46 AM ------      Message from: Daphine Deutscher      Created: Mon Jan 18, 2012  9:45 AM       Call and remind patient due for LFT for DB on 01/02/13. ------

## 2013-01-03 NOTE — Telephone Encounter (Signed)
Patient will come for labs.  

## 2013-01-04 ENCOUNTER — Other Ambulatory Visit (INDEPENDENT_AMBULATORY_CARE_PROVIDER_SITE_OTHER): Payer: Federal, State, Local not specified - PPO

## 2013-01-04 DIAGNOSIS — R7989 Other specified abnormal findings of blood chemistry: Secondary | ICD-10-CM

## 2013-01-04 LAB — HEPATIC FUNCTION PANEL
ALT: 16 U/L (ref 0–53)
AST: 17 U/L (ref 0–37)
Albumin: 4.3 g/dL (ref 3.5–5.2)
Alkaline Phosphatase: 49 U/L (ref 39–117)
Total Bilirubin: 0.9 mg/dL (ref 0.3–1.2)
Total Protein: 6.7 g/dL (ref 6.0–8.3)

## 2013-01-13 ENCOUNTER — Ambulatory Visit (INDEPENDENT_AMBULATORY_CARE_PROVIDER_SITE_OTHER): Payer: Federal, State, Local not specified - PPO | Admitting: Internal Medicine

## 2013-01-13 ENCOUNTER — Encounter: Payer: Self-pay | Admitting: Internal Medicine

## 2013-01-13 VITALS — BP 90/60 | HR 64 | Ht 69.0 in | Wt 155.8 lb

## 2013-01-13 DIAGNOSIS — R7989 Other specified abnormal findings of blood chemistry: Secondary | ICD-10-CM

## 2013-01-13 NOTE — Progress Notes (Signed)
John [name] October 23, 1950 [id number]   History of Present Illness:   This is a 62 year old white male with a history of abnormal liver function tests and 2 separate episodes of abdominal pain resembling biliary colic and cholangitis, but all his liver abnormalities have resolved and his biliary studies have been negative. We have been checking his liver function tests every 3 months for the past 2 years and they have been normal. The last test 1 week ago again showed normal transaminases, alkaline phosphatase and bilirubin. He has separate episodes of pruritus/hives and what seems to be an allergic reaction to something that he is not aware of. He has eliminated foods or contact allergies. He has had 3 episodes over the past 12 months. He usually takes Benadryl 25 mg for it which has been very effective   Past Medical History  Diagnosis Date  . Hemorrhoids     symptomatic  . Prostate nodule     left  . Abnormal liver function test   . History of hepatitis A     Past Surgical History  Procedure Laterality Date  . Tonsillectomy    . Colonoscopy      No Known Allergies  Review of Systems:   The remainder of the 10 point ROS is negative except as outlined in the H&P  Physical Exam: General Appearance Well developed, in no distress Eyes  Non icteric  HEENT  Non traumatic, normocephalic  Mouth No lesion, tongue papillated, no cheilosis Neck Supple without adenopathy, thyroid not enlarged, no carotid bruits, no JVD Lungs Clear to auscultation bilaterally COR Normal S1, normal S2, regular rhythm, no murmur, quiet precordium Abdomen Soft, nontender with normoactive bowel sounds Rectal not done Extremities  No pedal edema Skin No lesions Neurological Alert and oriented x 3 Psychological Normal mood and affect  Assessment and Plan:   Problem #39 62 year old white male with a history of abnormal liver function tests associated with abdominal pain. He has been stable for the past 2  years without any liver abnormalities. He will no longer be monitored but will let us know if he develops any new symptoms.  Problem #2 Allergic reactions. I suggested allergy testing. He should continue Benadryl 25 mg.  Problem #3 Colorectal screening. Patient's last colonoscopy was July 2013. A recall exam will be due in July 2023.    [name] 01/13/2013

## 2013-01-13 NOTE — Patient Instructions (Signed)
Dr Kwiatkowski 

## 2013-03-15 ENCOUNTER — Encounter: Payer: Self-pay | Admitting: Internal Medicine

## 2013-03-15 ENCOUNTER — Ambulatory Visit (INDEPENDENT_AMBULATORY_CARE_PROVIDER_SITE_OTHER): Payer: Medicare Other | Admitting: Internal Medicine

## 2013-03-15 ENCOUNTER — Other Ambulatory Visit: Payer: Self-pay | Admitting: *Deleted

## 2013-03-15 VITALS — BP 102/64 | HR 66 | Temp 97.7°F | Resp 18 | Ht 69.0 in | Wt 154.0 lb

## 2013-03-15 DIAGNOSIS — L259 Unspecified contact dermatitis, unspecified cause: Secondary | ICD-10-CM | POA: Diagnosis not present

## 2013-03-15 DIAGNOSIS — L309 Dermatitis, unspecified: Secondary | ICD-10-CM

## 2013-03-15 MED ORDER — PREDNISONE 10 MG PO TABS: ORAL_TABLET | ORAL | Status: DC

## 2013-03-15 MED ORDER — TRIAMCINOLONE ACETONIDE 0.1 % EX CREA: 1.0000 "application " | TOPICAL_CREAM | Freq: Two times a day (BID) | CUTANEOUS | Status: DC

## 2013-03-15 NOTE — Patient Instructions (Signed)
Eczema Eczema, also called atopic dermatitis, is a skin disorder that causes inflammation of the skin. It causes a red rash and dry, scaly skin. The skin becomes very itchy. Eczema is generally worse during the cooler winter months and often improves with the warmth of summer. Eczema usually starts showing signs in infancy. Some children outgrow eczema, but it may last through adulthood.  CAUSES  The exact cause of eczema is not known, but it appears to run in families. People with eczema often have a family history of eczema, allergies, asthma, or hay fever. Eczema is not contagious. Flare-ups of the condition may be caused by:   Contact with something you are sensitive or allergic to.   Stress. SIGNS AND SYMPTOMS  Dry, scaly skin.   Red, itchy rash.   Itchiness. This may occur before the skin rash and may be very intense.  DIAGNOSIS  The diagnosis of eczema is usually made based on symptoms and medical history. TREATMENT  Eczema cannot be cured, but symptoms usually can be controlled with treatment and other strategies. A treatment plan might include:  Controlling the itching and scratching.   Use over-the-counter antihistamines as directed for itching. This is especially useful at night when the itching tends to be worse.   Use over-the-counter steroid creams as directed for itching.   Avoid scratching. Scratching makes the rash and itching worse. It may also result in a skin infection (impetigo) due to a break in the skin caused by scratching.   Keeping the skin well moisturized with creams every day. This will seal in moisture and help prevent dryness. Lotions that contain alcohol and water should be avoided because they can dry the skin.   Limiting exposure to things that you are sensitive or allergic to (allergens).   Recognizing situations that cause stress.   Developing a plan to manage stress.  HOME CARE INSTRUCTIONS   Only take over-the-counter or  prescription medicines as directed by your health care provider.   Do not use anything on the skin without checking with your health care provider.   Keep baths or showers short (5 minutes) in warm (not hot) water. Use mild cleansers for bathing. These should be unscented. You may add nonperfumed bath oil to the bath water. It is best to avoid soap and bubble bath.   Immediately after a bath or shower, when the skin is still damp, apply a moisturizing ointment to the entire body. This ointment should be a petroleum ointment. This will seal in moisture and help prevent dryness. The thicker the ointment, the better. These should be unscented.   Keep fingernails cut short. Children with eczema may need to wear soft gloves or mittens at night after applying an ointment.   Dress in clothes made of cotton or cotton blends. Dress lightly, because heat increases itching.   A child with eczema should stay away from anyone with fever blisters or cold sores. The virus that causes fever blisters (herpes simplex) can cause a serious skin infection in children with eczema. SEEK MEDICAL CARE IF:   Your itching interferes with sleep.   Your rash gets worse or is not better within 1 week after starting treatment.   You see pus or soft yellow scabs in the rash area.   You have a fever.   You have a rash flare-up after contact with someone who has fever blisters.  Document Released: 01/17/2000 Document Revised: 11/09/2012 Document Reviewed: 08/22/2012 [facility] Patient Information 2014 [place].

## 2013-03-15 NOTE — Progress Notes (Signed)
Pre-visit discussion using our clinic review tool. No additional management support is needed unless otherwise documented below in the visit note.  

## 2013-03-15 NOTE — Progress Notes (Signed)
Subjective:    Patient ID: John [name], male    DOB: Jun 21, 1950, 63 y.o.   MRN: [id number]  HPI  63 year old patient who presents with a 8-10 week history of a rash involving primarily his low and mid back area. He feels this has spread somewhat to the anterior chest and minimally to the extremities. Rash is red raised and quite pruritic.  No recent exposure history. Denies any similar dermatitis in the past.  Past Medical History  Diagnosis Date  . Hemorrhoids     symptomatic  . Prostate nodule     left  . Abnormal liver function test   . History of hepatitis A     History   Social History  . Marital Status: Married    Spouse Name: N/A    Number of Children: N/A  . Years of Education: N/A   Occupational History  . Not on file.   Social History Main Topics  . Smoking status: Never Smoker   . Smokeless tobacco: Never Used  . Alcohol Use: 8.4 oz/week    14 Cans of beer per week     Comment: 2 daily   . Drug Use: No  . Sexual Activity: Not on file   Other Topics Concern  . Not on file   Social History Narrative   No caffeine     Past Surgical History  Procedure Laterality Date  . Tonsillectomy    . Colonoscopy      Family History  Problem Relation Age of Onset  . Breast cancer Mother   . Heart disease Mother     CHF  . Parkinsonism Mother   . Heart attack Father   . Colon polyps Father   . Heart disease Brother     post CABG  . Colon cancer Neg Hx   . Esophageal cancer Neg Hx   . Stomach cancer Neg Hx   . Prostate cancer Maternal Aunt     No Known Allergies  Current Outpatient Prescriptions on File Prior to Visit  Medication Sig Dispense Refill  . Probiotic Product (PROBIOTIC DAILY PO) Take 1 capsule by mouth daily.       No current facility-administered medications on file prior to visit.    BP 102/64  Pulse 66  Temp(Src) 97.7 F (36.5 C) (Oral)  Resp 18  Ht 5\' 9"  (1.753 m)  Wt 154 lb (69.854 kg)  BMI 22.73 kg/m2  SpO2  98%       Review of Systems  Constitutional: Negative for fever, chills, appetite change and fatigue.  HENT: Negative for congestion, dental problem, ear pain, hearing loss, sore throat, tinnitus, trouble swallowing and voice change.   Eyes: Negative for pain, discharge and visual disturbance.  Respiratory: Negative for cough, chest tightness, wheezing and stridor.   Cardiovascular: Negative for chest pain, palpitations and leg swelling.  Gastrointestinal: Negative for nausea, vomiting, abdominal pain, diarrhea, constipation, blood in stool and abdominal distention.  Genitourinary: Negative for urgency, hematuria, flank pain, discharge, difficulty urinating and genital sores.  Musculoskeletal: Negative for arthralgias, back pain, gait problem, joint swelling, myalgias and neck stiffness.  Skin: Positive for rash.  Neurological: Negative for dizziness, syncope, speech difficulty, weakness, numbness and headaches.  Hematological: Negative for adenopathy. Does not bruise/bleed easily.  Psychiatric/Behavioral: Negative for behavioral problems and dysphoric mood. The patient is not nervous/anxious.        Objective:   Physical Exam  Constitutional: He appears well-developed and well-nourished. No distress.  Skin: Rash noted.  Erythematous patchy rash involving the mid and upper back area primarily there were scattered erythematous papules over the anterior chest.          Assessment & Plan:   Probable eczema. Will treat with a brief prednisone Dosepak as well as topical triamcinolone and observe. Will consider dermatology referral if there is not prompt clinical improvement

## 2013-03-15 NOTE — Telephone Encounter (Signed)
Spoke to pt he already pickup Rx for Prednisone.

## 2013-03-16 DIAGNOSIS — N401 Enlarged prostate with lower urinary tract symptoms: Secondary | ICD-10-CM | POA: Diagnosis not present

## 2013-03-16 DIAGNOSIS — N139 Obstructive and reflux uropathy, unspecified: Secondary | ICD-10-CM | POA: Diagnosis not present

## 2013-03-27 ENCOUNTER — Telehealth: Payer: Self-pay | Admitting: Internal Medicine

## 2013-03-27 DIAGNOSIS — R21 Rash and other nonspecific skin eruption: Secondary | ICD-10-CM

## 2013-03-27 NOTE — Telephone Encounter (Signed)
Spoke to pt told him order for referral to Dermatology done and someone will be in contact with him. Pt verbalized understanding.

## 2013-03-27 NOTE — Telephone Encounter (Signed)
Okay to do referral to Dermatology?

## 2013-03-27 NOTE — Telephone Encounter (Signed)
Pt instructed to cb if rash not better for referral to dermatologist. Pt needs referral. Not better.

## 2013-03-27 NOTE — Telephone Encounter (Signed)
ok 

## 2013-03-29 ENCOUNTER — Telehealth: Payer: Self-pay | Admitting: Internal Medicine

## 2013-03-29 NOTE — Telephone Encounter (Signed)
Pt states he received a call. Pt has referral, but I don't see any notes. pls advise

## 2013-03-31 ENCOUNTER — Other Ambulatory Visit: Payer: Self-pay | Admitting: Internal Medicine

## 2013-04-04 ENCOUNTER — Telehealth: Payer: Self-pay | Admitting: Internal Medicine

## 2013-04-04 DIAGNOSIS — Z8249 Family history of ischemic heart disease and other diseases of the circulatory system: Secondary | ICD-10-CM

## 2013-04-04 NOTE — Telephone Encounter (Signed)
Pt states he talked w/ dr Raliegh Ip about referral to cardiologist. Pt would like to initiate that referral. Advised pt that dr Raliegh Ip out of office. Pt states he will wait for dr Raliegh Ip

## 2013-04-05 NOTE — Telephone Encounter (Signed)
Pt would like to see dr Jeneen Rinks allred

## 2013-04-17 NOTE — Telephone Encounter (Signed)
Pt requesting Cardiology referral, okay to do?

## 2013-04-17 NOTE — Telephone Encounter (Signed)
Left message on voicemail to call office.  

## 2013-04-17 NOTE — Telephone Encounter (Signed)
Ok to refer.

## 2013-04-18 NOTE — Telephone Encounter (Signed)
Spoke to pt asked him what he is seeing cardiologist for? Pt stated family history cardiac problems. Told pt okay order done for referral and someone will contact you regarding an appointment. Pt verbalized understanding.

## 2013-04-19 ENCOUNTER — Ambulatory Visit (INDEPENDENT_AMBULATORY_CARE_PROVIDER_SITE_OTHER): Payer: Medicare Other | Admitting: Cardiology

## 2013-04-19 ENCOUNTER — Encounter: Payer: Self-pay | Admitting: Cardiology

## 2013-04-19 VITALS — BP 100/72 | HR 65 | Ht 69.0 in | Wt 155.0 lb

## 2013-04-19 DIAGNOSIS — R5383 Other fatigue: Secondary | ICD-10-CM | POA: Insufficient documentation

## 2013-04-19 DIAGNOSIS — R5381 Other malaise: Secondary | ICD-10-CM | POA: Diagnosis not present

## 2013-04-19 DIAGNOSIS — R55 Syncope and collapse: Secondary | ICD-10-CM | POA: Diagnosis not present

## 2013-04-19 DIAGNOSIS — Z8249 Family history of ischemic heart disease and other diseases of the circulatory system: Secondary | ICD-10-CM

## 2013-04-19 NOTE — Progress Notes (Signed)
[facility]. [address], [address] [place], [place]  [address] Phone: [phone] Fax:  [phone]  Date:  04/19/2013   ID:  John [name], DOB [birth year], MRN [id number]  PCP:  Nyoka Cowden, MD   History of Present Illness: John [name] is a 63 y.o. male with a strong family history of coronary artery disease usually centered around the age of 78.  Father brother and brother's son.  Father - Died 90. Age 20 massive MI in 72.  Brother - now 89. At age 7 was found to have arterial issues. Had CABG.  Brother's son - 71 Stair climber - CP - +CAD on cath, CABG.  Diet, ok with exercise. Last few years slowed down. Mild nausea and general dizziness. More tired with activity. Chainsaw 60min. Has had fainting spells since child. Stiuational.   Has never had any exertional anginal symptoms. Headache is or viral like illness after a cruise several years ago.  His liver enzymes are now normal.  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  Wt Readings from Last 3 Encounters:  04/19/13 155 lb (70.308 kg)  03/15/13 154 lb (69.854 kg)  01/13/13 155 lb 12.8 oz (70.67 kg)     Past Medical History  Diagnosis Date  . Hemorrhoids     symptomatic  . Prostate nodule     left  . Abnormal liver function test   . History of hepatitis A     Past Surgical History  Procedure Laterality Date  . Tonsillectomy    . Colonoscopy      Current Outpatient Prescriptions  Medication Sig Dispense Refill  . Cholecalciferol (VITAMIN D-3) 1000 UNITS CAPS Take 1,000 Units by mouth daily.      . Probiotic Product (PROBIOTIC DAILY PO) Take 1 capsule by mouth daily.      [name] Kitchen triamcinolone cream (KENALOG) 0.1 % APPLY 1 APPLICATION TOPICALLY 2 (TWO) TIMES DAILY.  80 g  0   No current facility-administered medications for this visit.    Allergies:   No Known Allergies  Social History:  The patient  reports that he has never smoked. He has never used smokeless tobacco. He reports that he drinks about 8.4  ounces of alcohol per week. He reports that he does not use illicit drugs.   Family History  Problem Relation Age of Onset  . Breast cancer Mother   . Heart disease Mother     CHF  . Parkinsonism Mother   . Heart attack Father   . Colon polyps Father   . Heart disease Brother     post CABG  . Colon cancer Neg Hx   . Esophageal cancer Neg Hx   . Stomach cancer Neg Hx   . Prostate cancer Maternal Aunt     ROS:  Please see the history of present illness.   Denies any bleeding, orthopnea, PND, chest pain, significant shortness of breath, palpitations, fevers, chills. +Rash on back. Prior episodes of vasovagal syncope noted.  All other systems reviewed and negative.   PHYSICAL EXAM: VS:  BP 100/72  Pulse 65  Ht 5\' 9"  (1.753 m)  Wt 155 lb (70.308 kg)  BMI 22.88 kg/m2 Thin, well developed, in no acute distress HEENT: normal, [place]/AT, EOMI Neck: no JVD, normal carotid upstroke, no bruit Cardiac:  normal S1, S2; RRR; no murmur Lungs:  clear to auscultation bilaterally, no wheezing, rhonchi or rales Abd: soft, nontender, no hepatomegaly, no bruits Ext: no edema, 2+ distal pulses Skin:  warm and dryrash on back GU: deferred Neuro: no focal abnormalities noted, AAO x 3  EKG:  09/09/12-sinus bradycardia rate 55, no other abnormalities    ASSESSMENT AND PLAN:  1. Strong family history of coronary artery disease-we will begin with exercise treadmill test. He will contemplate CT calcium score. We discussed options. Other than some fatigue he is not having any chest discomfort, significant shortness of breath. We discussed the possibility of initiating statin therapy given his family history. I've also discussed with him the possibility of low-dose aspirin. Risks of bleeding/benefits of stroke/MI reduction. For now, we will proceed with exercise treadmill test. He will let me know if he wishes to proceed with calcium score. We also discussed the possibility of NMR lipoprofile. Basically, roads may  lead to the possibility of low-dose statin therapy for optimization of his primary prevention. He understands the limitations of testing. 2. 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 Continue to liberalize salt intake, aggressively hydrate, compression hose if necessary. Baseline hypotension. 3. We will followup with exercise treadmill test and at the least, annual visit.  Signed, Candee Furbish, MD [facility]  04/19/2013 11:43 AM

## 2013-04-19 NOTE — Patient Instructions (Signed)
Your physician recommends that you continue on your current medications as directed. Please refer to the Current Medication list given to you today.  Your physician has requested that you have an exercise tolerance test. For further information please visit [email or web address]. Please also follow instruction sheet, as given.  Your physician wants you to follow-up in: 1 year follow-up with Dr. Marlou Porch. You will receive a reminder letter in the mail two months in advance. If you don't receive a letter, please call our office to schedule the follow-up appointment.

## 2013-04-20 DIAGNOSIS — L259 Unspecified contact dermatitis, unspecified cause: Secondary | ICD-10-CM | POA: Diagnosis not present

## 2013-04-27 ENCOUNTER — Ambulatory Visit (HOSPITAL_COMMUNITY)
Admission: RE | Admit: 2013-04-27 | Discharge: 2013-04-27 | Disposition: A | Discharge status: Home / Self Care | Payer: Medicare Other | Source: Ambulatory Visit | Attending: Cardiology | Admitting: Cardiology

## 2013-04-27 DIAGNOSIS — Z8249 Family history of ischemic heart disease and other diseases of the circulatory system: Secondary | ICD-10-CM | POA: Diagnosis not present

## 2013-05-01 ENCOUNTER — Telehealth: Payer: Self-pay | Admitting: *Deleted

## 2013-05-01 DIAGNOSIS — R9430 Abnormal result of cardiovascular function study, unspecified: Secondary | ICD-10-CM

## 2013-05-01 DIAGNOSIS — R9431 Abnormal electrocardiogram [ECG] [EKG]: Secondary | ICD-10-CM

## 2013-05-01 NOTE — Telephone Encounter (Signed)
I spoke with pt about ETT results. Dr. Marlou Porch sent a message for pt to be set up for nuclear stress test. Pt is scheduled for 05/10/13 at 0715am  Instructions given to the patient. Horton Chin RN

## 2013-05-02 NOTE — Telephone Encounter (Signed)
I spoke with pt again today & he is aware he will be having a nuclear exercise stress test.  All questions answered. Reassurance Horton Chin RN

## 2013-05-10 ENCOUNTER — Ambulatory Visit (HOSPITAL_COMMUNITY): Payer: Medicare Other | Attending: Cardiovascular Disease | Admitting: Radiology

## 2013-05-10 VITALS — BP 108/72 | Ht 69.0 in | Wt 154.0 lb

## 2013-05-10 DIAGNOSIS — Z8249 Family history of ischemic heart disease and other diseases of the circulatory system: Secondary | ICD-10-CM | POA: Diagnosis not present

## 2013-05-10 DIAGNOSIS — R0609 Other forms of dyspnea: Secondary | ICD-10-CM | POA: Diagnosis not present

## 2013-05-10 DIAGNOSIS — R002 Palpitations: Secondary | ICD-10-CM | POA: Diagnosis not present

## 2013-05-10 DIAGNOSIS — R9430 Abnormal result of cardiovascular function study, unspecified: Secondary | ICD-10-CM

## 2013-05-10 DIAGNOSIS — R0989 Other specified symptoms and signs involving the circulatory and respiratory systems: Secondary | ICD-10-CM | POA: Diagnosis not present

## 2013-05-10 DIAGNOSIS — R110 Nausea: Secondary | ICD-10-CM | POA: Diagnosis not present

## 2013-05-10 DIAGNOSIS — R42 Dizziness and giddiness: Secondary | ICD-10-CM | POA: Insufficient documentation

## 2013-05-10 MED ORDER — TECHNETIUM TC 99M SESTAMIBI GENERIC - CARDIOLITE
30.0000 | Freq: Once | INTRAVENOUS | Status: AC | PRN
  Administered 2013-05-10: 30 via INTRAVENOUS

## 2013-05-10 MED ORDER — TECHNETIUM TC 99M SESTAMIBI GENERIC - CARDIOLITE
10.0000 | Freq: Once | INTRAVENOUS | Status: AC | PRN
  Administered 2013-05-10: 10 via INTRAVENOUS

## 2013-05-10 NOTE — Progress Notes (Signed)
[facility] [address] [place], [place] [address] [phone]    Cardiology Nuclear Med Study  John [name] is a 63 y.o. male     MRN : [id number]     DOB: 27-Apr-1950  Procedure Date: 05/10/2013  Nuclear Med Background Indication for Stress Test:  Evaluation for Ischemia History:  04/27/13 Abn GXT No report in EPIC Cardiac Risk Factors: Family History - CAD  Symptoms:  Dizziness, DOE, Nausea and Palpitations   Nuclear Pre-Procedure Caffeine/Decaff Intake:  None NPO After: 8:30pm   Lungs:  clear O2 Sat: 97% on room air. IV 0.9% NS with Angio Cath:  22g  IV Site: R Antecubital  IV Started by:  Crissie Figures, RN  Chest Size (in):  40 Cup Size: n/a  Height: 5\' 9"  (1.753 m)  Weight:  154 lb (69.854 kg)  BMI:  Body mass index is 22.73 kg/(m^2). Tech Comments:  N/A    Nuclear Med Study 1 or 2 day study: 1 day  Stress Test Type:  Stress  Reading MD: N/A  Order Authorizing Provider:  Candee Furbish, MD  Resting Radionuclide: Technetium 97m Sestamibi  Resting Radionuclide Dose: 10.2 mCi   Stress Radionuclide:  Technetium 60m Sestamibi  Stress Radionuclide Dose:32.8  mCi           Stress Protocol Rest HR: 54 Stress HR: 148  Rest BP: 108/72 Stress BP: 158/94  Exercise Time (min): 12:30 METS: 14.30   Predicted Max HR: 155 bpm % Max HR: 95.48 bpm Rate Pressure Product: 03474   Dose of Adenosine (mg):  n/a Dose of Lexiscan: n/a mg  Dose of Atropine (mg): n/a Dose of Dobutamine: n/a mcg/kg/min (at max HR)  Stress Test Technologist: Perrin Maltese, EMT-P  Nuclear Technologist:  Vedia Pereyra, CNMT     Rest Procedure:  Myocardial perfusion imaging was performed at rest 45 minutes following the intravenous administration of Technetium 60m Sestamibi. Rest ECG: NSR - Normal EKG  Stress Procedure:  The patient exercised on the treadmill utilizing the Bruce Protocol for 12:30 minutes. The patient stopped due to fatigue and denied any chest pain.   Technetium 50m Sestamibi was injected at peak exercise and myocardial perfusion imaging was performed after a brief delay. Stress ECG: less than 78mm of horizontal to upsloping ST segement depression at peak exerise  QPS Raw Data Images:  There is interference from nuclear activity from structures below the diaphragm. This does not affect the ability to read the study. Stress Images:  Very small area of decreased uptake in the inferolateral wall. Rest Images:  There is decreased uptake in the inferolateral wall. Subtraction (SDS):  No evidence of ischemia. Transient Ischemic Dilatation (Normal <1.22):  0.97 Lung/Heart Ratio (Normal <0.45):  0.27  Quantitative Gated Spect Images QGS EDV:  98 ml QGS ESV:  37 ml  Impression Exercise Capacity:  Excellent exercise capacity. BP Response:  Normal blood pressure response. Clinical Symptoms:  chest tightness in recovery ECG Impression:  Insignificant upsloping ST segment depression. Comparison with Prior Nuclear Study: No images to compare  Overall Impression:  Low risk stress nuclear study with small fixed defect in the inferolateral wall much more prominent on rest images c/w diaphragmatic attenuation.  No evidence of ischemia..  LV Ejection Fraction: 62%.  LV Wall Motion:  NL LV Function; NL Wall Motion   Signed:   Fransico Him, MD

## 2013-05-15 ENCOUNTER — Telehealth: Payer: Self-pay | Admitting: Cardiology

## 2013-05-15 NOTE — Telephone Encounter (Signed)
Message copied by Buford Gayler, Jerl Mina on Mon May 15, 2013  2:41 PM ------      Message from: Candee Furbish C      Created: Mon May 15, 2013  9:13 AM       Low risk, no ischemia, reassuring pumping function. Good-looking study in the setting of previous abnormal exercise tolerance test. ------

## 2013-05-18 ENCOUNTER — Encounter: Payer: Self-pay | Admitting: Internal Medicine

## 2013-10-03 DIAGNOSIS — H25049 Posterior subcapsular polar age-related cataract, unspecified eye: Secondary | ICD-10-CM | POA: Diagnosis not present

## 2013-12-14 ENCOUNTER — Other Ambulatory Visit (INDEPENDENT_AMBULATORY_CARE_PROVIDER_SITE_OTHER): Payer: Medicare Other

## 2013-12-14 DIAGNOSIS — Z0000 Encounter for general adult medical examination without abnormal findings: Secondary | ICD-10-CM

## 2013-12-14 LAB — POCT URINALYSIS DIPSTICK
BILIRUBIN UA: NEGATIVE
Blood, UA: NEGATIVE
Glucose, UA: NEGATIVE
KETONES UA: NEGATIVE
LEUKOCYTES UA: NEGATIVE
Nitrite, UA: NEGATIVE
PROTEIN UA: NEGATIVE
SPEC GRAV UA: 1.015
Urobilinogen, UA: 0.2
pH, UA: 7.5

## 2013-12-14 LAB — CBC WITH DIFFERENTIAL/PLATELET
Basophils Absolute: 0 10*3/uL (ref 0.0–0.1)
Basophils Relative: 0.5 % (ref 0.0–3.0)
EOS PCT: 0.5 % (ref 0.0–5.0)
Eosinophils Absolute: 0 10*3/uL (ref 0.0–0.7)
HCT: 43.7 % (ref 39.0–52.0)
HEMOGLOBIN: 14.9 g/dL (ref 13.0–17.0)
LYMPHS ABS: 1.9 10*3/uL (ref 0.7–4.0)
LYMPHS PCT: 38.6 % (ref 12.0–46.0)
MCHC: 34 g/dL (ref 30.0–36.0)
MCV: 91.8 fl (ref 78.0–100.0)
MONOS PCT: 9.3 % (ref 3.0–12.0)
Monocytes Absolute: 0.5 10*3/uL (ref 0.1–1.0)
Neutro Abs: 2.5 10*3/uL (ref 1.4–7.7)
Neutrophils Relative %: 51.1 % (ref 43.0–77.0)
PLATELETS: 185 10*3/uL (ref 150.0–400.0)
RBC: 4.76 Mil/uL (ref 4.22–5.81)
RDW: 13.5 % (ref 11.5–15.5)
WBC: 4.9 10*3/uL (ref 4.0–10.5)

## 2013-12-14 LAB — LIPID PANEL
CHOL/HDL RATIO: 3
Cholesterol: 183 mg/dL (ref 0–200)
HDL: 55.9 mg/dL (ref >39.00)
LDL Cholesterol: 119 mg/dL — ABNORMAL HIGH (ref 0–99)
NonHDL: 127.1
Triglycerides: 42 mg/dL (ref 0.0–149.0)
VLDL: 8.4 mg/dL (ref 0.0–40.0)

## 2013-12-14 LAB — BASIC METABOLIC PANEL
BUN: 11 mg/dL (ref 6–23)
CALCIUM: 9.6 mg/dL (ref 8.4–10.5)
CHLORIDE: 104 meq/L (ref 96–112)
CO2: 24 mEq/L (ref 19–32)
Creatinine, Ser: 0.7 mg/dL (ref 0.4–1.5)
GFR: 126.23 mL/min (ref >60.00)
GLUCOSE: 94 mg/dL (ref 70–99)
Potassium: 4.3 mEq/L (ref 3.5–5.1)
SODIUM: 141 meq/L (ref 135–145)

## 2013-12-14 LAB — PSA: PSA: 1.96 ng/mL (ref 0.10–4.00)

## 2013-12-14 LAB — HEPATIC FUNCTION PANEL
ALBUMIN: 3.9 g/dL (ref 3.5–5.2)
ALT: 17 U/L (ref 0–53)
AST: 20 U/L (ref 0–37)
Alkaline Phosphatase: 79 U/L (ref 39–117)
Bilirubin, Direct: 0.1 mg/dL (ref 0.0–0.3)
TOTAL PROTEIN: 6.6 g/dL (ref 6.0–8.3)
Total Bilirubin: 0.8 mg/dL (ref 0.2–1.2)

## 2013-12-14 LAB — TSH: TSH: 2.44 u[IU]/mL (ref 0.35–4.50)

## 2013-12-20 ENCOUNTER — Ambulatory Visit (INDEPENDENT_AMBULATORY_CARE_PROVIDER_SITE_OTHER): Payer: Medicare Other | Admitting: Internal Medicine

## 2013-12-20 ENCOUNTER — Encounter: Payer: Self-pay | Admitting: Internal Medicine

## 2013-12-20 VITALS — BP 110/64 | HR 64 | Temp 98.1°F | Resp 18 | Ht 69.0 in | Wt 157.0 lb

## 2013-12-20 DIAGNOSIS — Z23 Encounter for immunization: Secondary | ICD-10-CM

## 2013-12-20 MED ORDER — HYDROCORTISONE ACETATE 25 MG RE SUPP: 25.0000 mg | Freq: Two times a day (BID) | RECTAL | Status: DC

## 2013-12-20 NOTE — Progress Notes (Signed)
Pre visit review using our clinic review tool, if applicable. No additional management support is needed unless otherwise documented below in the visit note. 

## 2013-12-20 NOTE — Progress Notes (Signed)
Patient ID: John [name], male   DOB: 28-Jul-1950, 63 y.o.   MRN: [id number]  Subjective:    Patient ID: John [name], male    DOB: 11-Jan-1951, 63 y.o.   MRN: [id number]  HPI   63 -year-old patient who is seen today for an annual exam. He does remarkably well and is on no chronic medications.  Past Medical History  Diagnosis Date  . Hemorrhoids     symptomatic  . Prostate nodule     left  . Abnormal liver function test   . History of hepatitis A     History   Social History  . Marital Status: Married    Spouse Name: N/A    Number of Children: N/A  . Years of Education: N/A   Occupational History  . Not on file.   Social History Main Topics  . Smoking status: Never Smoker   . Smokeless tobacco: Never Used  . Alcohol Use: 8.4 oz/week    14 Cans of beer per week     Comment: 2 daily   . Drug Use: No  . Sexual Activity: Not on file   Other Topics Concern  . Not on file   Social History Narrative   No caffeine     Past Surgical History  Procedure Laterality Date  . Tonsillectomy    . Colonoscopy      Family History  Problem Relation Age of Onset  . Breast cancer Mother   . Heart disease Mother     CHF  . Parkinsonism Mother   . Heart attack Father   . Colon polyps Father   . Heart disease Brother     post CABG  . Colon cancer Neg Hx   . Esophageal cancer Neg Hx   . Stomach cancer Neg Hx   . Prostate cancer Maternal Aunt     No Known Allergies  Current Outpatient Prescriptions on File Prior to Visit  Medication Sig Dispense Refill  . Cholecalciferol (VITAMIN D-3) 1000 UNITS CAPS Take 1,000 Units by mouth daily.    . Probiotic Product (PROBIOTIC DAILY PO) Take 1 capsule by mouth daily.    [name] Kitchen triamcinolone cream (KENALOG) 0.1 % APPLY 1 APPLICATION TOPICALLY 2 (TWO) TIMES DAILY. 80 g 0   No current facility-administered medications on file prior to visit.    BP 110/64 mmHg  Pulse 64  Temp(Src) 98.1 F (36.7 C) (Oral)  Resp 18  Ht 5\' 9"  (1.753  m)  Wt 157 lb (71.215 kg)  BMI 23.17 kg/m2  SpO2 98%      Review of Systems  Constitutional: Negative for fever, chills, activity change, appetite change and fatigue.  HENT: Negative for congestion, dental problem, ear pain, hearing loss, mouth sores, rhinorrhea, sinus pressure, sneezing, tinnitus, trouble swallowing and voice change.   Eyes: Negative for photophobia, pain, redness and visual disturbance.  Respiratory: Negative for apnea, cough, choking, chest tightness, shortness of breath and wheezing.   Cardiovascular: Negative for chest pain, palpitations and leg swelling.  Gastrointestinal: Negative for nausea, vomiting, abdominal pain, diarrhea, constipation, blood in stool, abdominal distention, anal bleeding and rectal pain.  Genitourinary: Negative for dysuria, urgency, frequency, hematuria, flank pain, decreased urine volume, discharge, penile swelling, scrotal swelling, difficulty urinating, genital sores and testicular pain.  Musculoskeletal: Negative for myalgias, back pain, joint swelling, arthralgias, gait problem, neck pain and neck stiffness.  Skin: Negative for color change, rash and wound.  Neurological: Negative for dizziness, tremors, seizures, syncope, facial asymmetry, speech difficulty,  weakness, light-headedness, numbness and headaches.  Hematological: Negative for adenopathy. Does not bruise/bleed easily.  Psychiatric/Behavioral: Negative for suicidal ideas, hallucinations, behavioral problems, confusion, sleep disturbance, self-injury, dysphoric mood, decreased concentration and agitation. The patient is not nervous/anxious.        Objective:   Physical Exam  Constitutional: He appears well-developed and well-nourished.  HENT:  Head: Normocephalic and atraumatic.  Right Ear: External ear normal.  Left Ear: External ear normal.  Nose: Nose normal.  Mouth/Throat: Oropharynx is clear and moist.  Eyes: Conjunctivae and EOM are normal. Pupils are equal, round,  and reactive to light. No scleral icterus.  Neck: Normal range of motion. Neck supple. No JVD present. No thyromegaly present.  Cardiovascular: Regular rhythm, normal heart sounds and intact distal pulses.  Exam reveals no gallop and no friction rub.   No murmur heard. Pulmonary/Chest: Effort normal and breath sounds normal. He exhibits no tenderness.  Abdominal: Soft. Bowel sounds are normal. He exhibits no distension and no mass. There is no tenderness.  Genitourinary: Penis normal.  Prostate minimally enlarged  Hemorrhoidal tags  Musculoskeletal: Normal range of motion. He exhibits no edema or tenderness.  Lymphadenopathy:    He has no cervical adenopathy.  Neurological: He is alert. He has normal reflexes. No cranial nerve deficit. Coordination normal.  Skin: Skin is warm and dry. No rash noted.  Psychiatric: He has a normal mood and affect. His behavior is normal.          Assessment & Plan:   Preventive health examination.  Return in one year for followup

## 2013-12-20 NOTE — Patient Instructions (Signed)

## 2014-02-19 ENCOUNTER — Ambulatory Visit (INDEPENDENT_AMBULATORY_CARE_PROVIDER_SITE_OTHER): Payer: Medicare Other | Admitting: Internal Medicine

## 2014-02-19 ENCOUNTER — Encounter: Payer: Self-pay | Admitting: Internal Medicine

## 2014-02-19 VITALS — BP 100/70 | Temp 97.9°F | Resp 20 | Ht 69.0 in | Wt 162.0 lb

## 2014-02-19 DIAGNOSIS — N528 Other male erectile dysfunction: Secondary | ICD-10-CM | POA: Diagnosis not present

## 2014-02-19 DIAGNOSIS — N402 Nodular prostate without lower urinary tract symptoms: Secondary | ICD-10-CM

## 2014-02-19 NOTE — Progress Notes (Signed)
Pre visit review using our clinic review tool, if applicable. No additional management support is needed unless otherwise documented below in the visit note. 

## 2014-02-19 NOTE — Patient Instructions (Signed)
Urological follow-up as planned

## 2014-02-19 NOTE — Progress Notes (Signed)
Subjective:    Patient ID: John [name], male    DOB: 1951/01/21, 64 y.o.   MRN: [id number]  HPI  64 year old patient who states that he has had some intermittent discoloration of his semen over the past year since October of last year.  He has occasional testicular tenderness.  The left side greater than the right.  Is also had some mild the intermittent ED concerns.  These issues were not addressed at the time of his physical in November of last year, twice in November.  He had bloody ejaculate over 3 weeks.  He is scheduled for annual urological evaluation in about 1 month.  Today he feels well.  Urinalysis in November was normal.  PSA was normal  Past Medical History  Diagnosis Date  . Hemorrhoids     symptomatic  . Prostate nodule     left  . Abnormal liver function test   . History of hepatitis A     History   Social History  . Marital Status: Married    Spouse Name: N/A    Number of Children: N/A  . Years of Education: N/A   Occupational History  . Not on file.   Social History Main Topics  . Smoking status: Never Smoker   . Smokeless tobacco: Never Used  . Alcohol Use: 8.4 oz/week    14 Cans of beer per week     Comment: 2 daily   . Drug Use: No  . Sexual Activity: Not on file   Other Topics Concern  . Not on file   Social History Narrative   No caffeine     Past Surgical History  Procedure Laterality Date  . Tonsillectomy    . Colonoscopy      Family History  Problem Relation Age of Onset  . Breast cancer Mother   . Heart disease Mother     CHF  . Parkinsonism Mother   . Heart attack Father   . Colon polyps Father   . Heart disease Brother     post CABG  . Colon cancer Neg Hx   . Esophageal cancer Neg Hx   . Stomach cancer Neg Hx   . Prostate cancer Maternal Aunt     No Known Allergies  Current Outpatient Prescriptions on File Prior to Visit  Medication Sig Dispense Refill  . Cholecalciferol (VITAMIN D-3) 1000 UNITS CAPS Take 1,000  Units by mouth daily.    . hydrocortisone (ANUSOL-HC) 25 MG suppository Place 1 suppository (25 mg total) rectally 2 (two) times daily. 12 suppository 4  . Probiotic Product (PROBIOTIC DAILY PO) Take 1 capsule by mouth daily.    [name] Kitchen triamcinolone cream (KENALOG) 0.1 % APPLY 1 APPLICATION TOPICALLY 2 (TWO) TIMES DAILY. 80 g 0   No current facility-administered medications on file prior to visit.    BP 100/70 mmHg  Temp(Src) 97.9 F (36.6 C) (Oral)  Resp 20  Ht 5\' 9"  (1.753 m)  Wt 162 lb (73.483 kg)  BMI 23.91 kg/m2     Review of Systems  Constitutional: Negative for fever, chills, appetite change and fatigue.  HENT: Negative for congestion, dental problem, ear pain, hearing loss, sore throat, tinnitus, trouble swallowing and voice change.   Eyes: Negative for pain, discharge and visual disturbance.  Respiratory: Negative for cough, chest tightness, wheezing and stridor.   Cardiovascular: Negative for chest pain, palpitations and leg swelling.  Gastrointestinal: Negative for nausea, vomiting, abdominal pain, diarrhea, constipation, blood in stool and abdominal distention.  Genitourinary: Positive for testicular pain. Negative for urgency, hematuria, flank pain, discharge, difficulty urinating and genital sores.  Musculoskeletal: Negative for myalgias, back pain, joint swelling, arthralgias, gait problem and neck stiffness.  Skin: Negative for rash.  Neurological: Negative for dizziness, syncope, speech difficulty, weakness, numbness and headaches.  Hematological: Negative for adenopathy. Does not bruise/bleed easily.  Psychiatric/Behavioral: Negative for behavioral problems and dysphoric mood. The patient is not nervous/anxious.        Objective:   Physical Exam  Constitutional: He is oriented to person, place, and time. He appears well-developed.  HENT:  Head: Normocephalic.  Right Ear: External ear normal.  Left Ear: External ear normal.  Eyes: Conjunctivae and EOM are normal.   Neck: Normal range of motion.  Cardiovascular: Normal rate and normal heart sounds.   Pulmonary/Chest: Breath sounds normal.  Abdominal: Bowel sounds are normal.  Genitourinary: Rectum normal and penis normal.  Normal exam Prostate plus 2 enlarged without tenderness  Musculoskeletal: Normal range of motion. He exhibits no edema or tenderness.  Neurological: He is alert and oriented to person, place, and time.  Psychiatric: He has a normal mood and affect. His behavior is normal.          Assessment & Plan:   Intermittent prostatodynia and/or testicular discomfort.  Clinical exam is unremarkable.  He is scheduled for urology follow for 1 month.  Will observe at this time ED.  Samples of Viagra, dispensed

## 2014-03-19 DIAGNOSIS — N400 Enlarged prostate without lower urinary tract symptoms: Secondary | ICD-10-CM | POA: Diagnosis not present

## 2014-03-19 DIAGNOSIS — R361 Hematospermia: Secondary | ICD-10-CM | POA: Diagnosis not present

## 2014-04-19 ENCOUNTER — Ambulatory Visit (INDEPENDENT_AMBULATORY_CARE_PROVIDER_SITE_OTHER): Payer: Medicare Other | Admitting: Cardiology

## 2014-04-19 ENCOUNTER — Encounter: Payer: Self-pay | Admitting: Cardiology

## 2014-04-19 VITALS — BP 110/82 | HR 60 | Ht 69.0 in | Wt 170.0 lb

## 2014-04-19 DIAGNOSIS — Z8249 Family history of ischemic heart disease and other diseases of the circulatory system: Secondary | ICD-10-CM

## 2014-04-19 DIAGNOSIS — R55 Syncope and collapse: Secondary | ICD-10-CM

## 2014-04-19 NOTE — Progress Notes (Signed)
[facility]. [address], [address] [place], [place]  [address] Phone: [phone] Fax:  [phone]  Date:  04/19/2014   ID:  TION TSE, DOB December 28, 1950, MRN [id number]  PCP:  Nyoka Cowden, MD   History of Present Illness: John [name] is a 64 y.o. male with a strong family history of coronary artery disease usually centered around the age of 13.  Father brother and brother's son.  Father - Died 90. Age 78 massive MI in 35.  Brother - now 74. At age 66 was found to have arterial issues. Had CABG.  Brother's son - 43 Stair climber - CP - +CAD on cath, CABG.  Has had fainting spells since child. Stiuational.   Has never had any exertional anginal symptoms. Headache is or viral like illness after a cruise several years ago.  His liver enzymes are now normal.  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  His exercise stress test was false positive with ST segment depressions noted, he underwent nuclear stress test subsequently on 05/11/13 which was low risk, no ischemia. This past year he has been feeling very well. No symptoms.   Wt Readings from Last 3 Encounters:  04/19/14 170 lb (77.111 kg)  02/19/14 162 lb (73.483 kg)  12/20/13 157 lb (71.215 kg)     Past Medical History  Diagnosis Date  . Hemorrhoids     symptomatic  . Prostate nodule     left  . Abnormal liver function test   . History of hepatitis A     Past Surgical History  Procedure Laterality Date  . Tonsillectomy    . Colonoscopy      Current Outpatient Prescriptions  Medication Sig Dispense Refill  . Cholecalciferol (VITAMIN D-3) 1000 UNITS CAPS Take 20,000 Units by mouth once a week.     . hydrocortisone (ANUSOL-HC) 25 MG suppository Place 1 suppository (25 mg total) rectally 2 (two) times daily. (Patient taking differently: Place 25 mg rectally as needed for itching. ) 12 suppository 4  . Probiotic Product (PROBIOTIC DAILY PO) Take 2 capsules by mouth once a week.     . triamcinolone cream  (KENALOG) 0.1 % APPLY 1 APPLICATION TOPICALLY 2 (TWO) TIMES DAILY. (Patient taking differently: APPLY 1 APPLICATION TOPICALLY AS NEEDED) 80 g 0   No current facility-administered medications for this visit.    Allergies:   No Known Allergies  Social History:  The patient  reports that he has never smoked. He has never used smokeless tobacco. He reports that he drinks about 8.4 oz of alcohol per week. He reports that he does not use illicit drugs.   Family History  Problem Relation Age of Onset  . Breast cancer Mother   . Heart disease Mother     CHF  . Parkinsonism Mother   . Heart attack Father   . Colon polyps Father   . Heart disease Brother     post CABG  . Colon cancer Neg Hx   . Esophageal cancer Neg Hx   . Stomach cancer Neg Hx   . Prostate cancer Maternal Aunt     ROS:  Please see the history of present illness.   Denies any bleeding, orthopnea, PND, chest pain, significant shortness of breath, palpitations, fevers, chills. Prior episodes of vasovagal syncope noted-he has not had one for a while.  All other systems reviewed and negative.   PHYSICAL EXAM: VS:  BP 110/82 mmHg  Pulse 60  Ht 5\' 9"  (  1.753 m)  Wt 170 lb (77.111 kg)  BMI 25.09 kg/m2 Thin, well developed, in no acute distress HEENT: normal, [place]/AT, EOMI Neck: no JVD, normal carotid upstroke, no bruit Cardiac:  normal S1, S2; RRR; no murmur Lungs:  clear to auscultation bilaterally, no wheezing, rhonchi or rales Abd: soft, nontender, no hepatomegaly, no bruits Ext: no edema, 2+ distal pulses Skin: warm and dryrash on back GU: deferred Neuro: no focal abnormalities noted, AAO x 3  EKG:  04/19/14-sinus rhythm, 60, no other abnormalities. Prior 09/09/12-sinus bradycardia rate 55, no other abnormalities    ASSESSMENT AND PLAN:  1. Strong family history of coronary artery disease-he underwent an exercise treadmill test which showed ST segment depressions but excellent exercise time. This was followed up with a  nuclear stress test on 05/11/13 which overall was low risk, no ischemia. I was very satisfied with this result. False positive ETT. He is not having any chest discomfort, no significant shortness of breath. We discussed previously the possibility of initiating statin therapy given his family history. I've also discussed with him the possibility of low-dose aspirin. Risks of bleeding/benefits of stroke/MI reduction. Basically, roads may lead to the possibility of low-dose statin therapy for optimization of his primary prevention. He understands the limitations of testing. Overall however his LDL cholesterol is 119, HDL 56. Excellent. 2. 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 Continue to liberalize salt intake, aggressively hydrate, compression hose if necessary. Baseline hypotension.  3. Annual visit.  Signed, Candee Furbish, MD [facility]  04/19/2014 3:47 PM

## 2014-04-19 NOTE — Patient Instructions (Signed)
The current medical regimen is effective;  continue present plan and medications.  Follow up in 1 year with Dr. Skains.  You will receive a letter in the mail 2 months before you are due.  Please call us when you receive this letter to schedule your follow up appointment.  Thank you for choosing [facility]!!     

## 2014-11-28 ENCOUNTER — Encounter: Payer: Self-pay | Admitting: Adult Health

## 2014-11-28 ENCOUNTER — Ambulatory Visit (INDEPENDENT_AMBULATORY_CARE_PROVIDER_SITE_OTHER): Payer: Medicare Other | Admitting: Adult Health

## 2014-11-28 DIAGNOSIS — L03012 Cellulitis of left finger: Secondary | ICD-10-CM | POA: Diagnosis not present

## 2014-11-28 DIAGNOSIS — Z23 Encounter for immunization: Secondary | ICD-10-CM | POA: Diagnosis not present

## 2014-11-28 MED ORDER — CEPHALEXIN 500 MG PO TABS: 500.0000 mg | ORAL_TABLET | Freq: Two times a day (BID) | ORAL | Status: DC

## 2014-11-28 NOTE — Patient Instructions (Addendum)
It was great meeting you today  Take the antibiotics as directed.   Soak the finger in warm water and epsom salt for 15 minutes twice a day for the next 5 days.   If you do not have any improvement in the next 2-3 days, please let us know.    Paronychia Paronychia is an infection of the skin that surrounds a nail. It usually affects the skin around a fingernail, but it may also occur near a toenail. It often causes pain and swelling around the nail. This condition may come on suddenly or develop over a longer period. In some cases, a collection of pus (abscess) can form near or under the nail. Usually, paronychia is not serious and it clears up with treatment. CAUSES This condition may be caused by bacteria or fungi. It is commonly caused by either Streptococcus or Staphylococcus bacteria. The bacteria or fungi often cause the infection by getting into the affected area through an opening in the skin, such as a cut or a hangnail. RISK FACTORS This condition is more likely to develop in:  People who get their hands wet often, such as those who work as Designer, industrial/product, bartenders, or nurses.  People who bite their fingernails or suck their thumbs.  People who trim their nails too short.  People who have hangnails or injured fingertips.  People who get manicures.  People who have diabetes. SYMPTOMS Symptoms of this condition include:  Redness and swelling of the skin near the nail.  Tenderness around the nail when you touch the area.  Pus-filled bumps under the cuticle. The cuticle is the skin at the base or sides of the nail.  Fluid or pus under the nail.  Throbbing pain in the area. DIAGNOSIS This condition is usually diagnosed with a physical exam. In some cases, a sample of pus may be taken from an abscess to be tested in a lab. This can help to determine what type of bacteria or fungi is causing the condition. TREATMENT Treatment for this condition depends on the cause and  severity of the condition. If the condition is mild, it may clear up on its own in a few days. Your health care provider may recommend soaking the affected area in warm water a few times a day. When treatment is needed, the options may include:  Antibiotic medicine, if the condition is caused by a bacterial infection.  Antifungal medicine, if the condition is caused by a fungal infection.  Incision and drainage, if an abscess is present. In this procedure, the health care provider will cut open the abscess so the pus can drain out. HOME CARE INSTRUCTIONS  Soak the affected area in warm water if directed to do so by your health care provider. You may be told to do this for 20 minutes, 2-3 times a day. Keep the area dry in between soakings.  Take medicines only as directed by your health care provider.  If you were prescribed an antibiotic medicine, finish all of it even if you start to feel better.  Keep the affected area clean.  Do not try to drain a fluid-filled bump yourself.  If you will be washing dishes or performing other tasks that require your hands to get wet, wear rubber gloves. You should also wear gloves if your hands might come in contact with irritating substances, such as cleaners or chemicals.  Follow your health care provider's instructions about:  Wound care.  Bandage (dressing) changes and removal. [place]  IF:  Your symptoms get worse or do not improve with treatment.  You have a fever or chills.  You have redness spreading from the affected area.  You have continued or increased fluid, blood, or pus coming from the affected area.  Your finger or knuckle becomes swollen or is difficult to move.   This information is not intended to replace advice given to you by your health care provider. Make sure you discuss any questions you have with your health care provider.   Document Released: 07/15/2000 Document Revised: 06/05/2014 Document Reviewed:  12/27/2013 Elsevier Interactive Patient Education [employer].

## 2014-11-28 NOTE — Progress Notes (Signed)
Pre visit review using our clinic review tool, if applicable. No additional management support is needed unless otherwise documented below in the visit note. 

## 2014-11-28 NOTE — Progress Notes (Signed)
   Subjective:    Patient ID: John [name], male    DOB: 18-Feb-1950, 64 y.o.   MRN: [id number]  HPI 64 year old male who presents to the office today for an acute issue. He has had redness, swelling, and pain to his left middle finger for the last 2 months. He endorses that he "may have smashed it with something in my work shop." More recently he has noticed that the area has started bleeding.   Denies any streaking or fevers. No purulent discharge.    Review of Systems  Skin: Positive for color change and wound.  All other systems reviewed and are negative.      Objective:   Physical Exam  Constitutional: He is oriented to person, place, and time. He appears well-developed and well-nourished. No distress.  Neurological: He is alert and oriented to person, place, and time.  Skin: Skin is warm and dry. There is erythema.  Psychiatric: He has a normal mood and affect. His behavior is normal. Judgment and thought content normal.  Nursing note and vitals reviewed.     Assessment & Plan:   1. Paronychia, left - Cephalexin 500 MG tablet; Take 1 tablet (500 mg total) by mouth 2 (two) times daily.  Dispense: 14 tablet; Refill: 0 - Epson salt soak twice a day for 5 days  2. Encounter for immunization - High dose flu given

## 2014-11-29 DIAGNOSIS — H2513 Age-related nuclear cataract, bilateral: Secondary | ICD-10-CM | POA: Diagnosis not present

## 2014-11-29 DIAGNOSIS — H524 Presbyopia: Secondary | ICD-10-CM | POA: Diagnosis not present

## 2014-12-20 DIAGNOSIS — H6123 Impacted cerumen, bilateral: Secondary | ICD-10-CM | POA: Diagnosis not present

## 2014-12-20 DIAGNOSIS — H9313 Tinnitus, bilateral: Secondary | ICD-10-CM | POA: Diagnosis not present

## 2015-02-03 HISTORY — PX: PROSTATE BIOPSY: SHX241

## 2015-04-18 ENCOUNTER — Ambulatory Visit (INDEPENDENT_AMBULATORY_CARE_PROVIDER_SITE_OTHER): Payer: Medicare Other | Admitting: Adult Health

## 2015-04-18 ENCOUNTER — Other Ambulatory Visit: Payer: Self-pay

## 2015-04-18 ENCOUNTER — Encounter: Payer: Self-pay | Admitting: *Deleted

## 2015-04-18 ENCOUNTER — Encounter: Payer: Self-pay | Admitting: Adult Health

## 2015-04-18 VITALS — BP 122/64 | Temp 98.3°F | Ht 69.0 in | Wt 163.0 lb

## 2015-04-18 DIAGNOSIS — L03012 Cellulitis of left finger: Secondary | ICD-10-CM

## 2015-04-18 MED ORDER — CEPHALEXIN 500 MG PO TABS: 500.0000 mg | ORAL_TABLET | Freq: Three times a day (TID) | ORAL | Status: DC

## 2015-04-18 MED ORDER — HYDROCORTISONE ACETATE 25 MG RE SUPP: 25.0000 mg | RECTAL | Status: DC | PRN

## 2015-04-18 NOTE — Telephone Encounter (Signed)
Error

## 2015-04-18 NOTE — Progress Notes (Signed)
Subjective:    Patient ID: John [name], male    DOB: 09-20-1950, 65 y.o.   MRN: [id number]  HPI  65 year old gentleman who presents to the office today for possible infection to finger. Last saw him 11/28/2014 for the same issue area and he had redness, swelling, and pain around his left middle finger for 2 months prior to being seen. At that time there did not appear to be anything to have drained the incision so he was started on a 7 day course of Keflex twice a day. He reports that for the most part his finger infection resolved during his last visit. Up until about 2 weeks ago he was not experiencing any pain, redness, or discharge from his left middle finger. Reports that yesterday he was able to "squeeze some pus out of it"   Denies any fevers or streaking.   Review of Systems  Constitutional: Negative.   Skin: Positive for color change. Negative for pallor, rash and wound.  All other systems reviewed and are negative.  Past Medical History  Diagnosis Date  . Hemorrhoids     symptomatic  . Prostate nodule     left  . Abnormal liver function test   . History of hepatitis A     Social History   Social History  . Marital Status: Married    Spouse Name: N/A  . Number of Children: N/A  . Years of Education: N/A   Occupational History  . Not on file.   Social History Main Topics  . Smoking status: Never Smoker   . Smokeless tobacco: Never Used  . Alcohol Use: 8.4 oz/week    14 Cans of beer per week     Comment: 2 daily   . Drug Use: No  . Sexual Activity: Not on file   Other Topics Concern  . Not on file   Social History Narrative   No caffeine     Past Surgical History  Procedure Laterality Date  . Tonsillectomy    . Colonoscopy      Family History  Problem Relation Age of Onset  . Breast cancer Mother   . Heart disease Mother     CHF  . Parkinsonism Mother   . Heart attack Father   . Colon polyps Father   . Heart disease Brother     post CABG   . Colon cancer Neg Hx   . Esophageal cancer Neg Hx   . Stomach cancer Neg Hx   . Prostate cancer Maternal Aunt     No Known Allergies  Current Outpatient Prescriptions on File Prior to Visit  Medication Sig Dispense Refill  . Cholecalciferol (VITAMIN D-3) 1000 UNITS CAPS Take 20,000 Units by mouth once a week.     . Probiotic Product (PROBIOTIC DAILY PO) Take 2 capsules by mouth once a week.     . triamcinolone cream (KENALOG) 0.1 % APPLY 1 APPLICATION TOPICALLY 2 (TWO) TIMES DAILY. (Patient taking differently: APPLY 1 APPLICATION TOPICALLY AS NEEDED) 80 g 0   No current facility-administered medications on file prior to visit.    BP 122/64 mmHg  Temp(Src) 98.3 F (36.8 C) (Oral)  Ht 5\' 9"  (1.753 m)  Wt 163 lb (73.936 kg)  BMI 24.06 kg/m2       Objective:   Physical Exam  Constitutional: He is oriented to person, place, and time. He appears well-developed and well-nourished. No distress.  HENT:  Head: Normocephalic and atraumatic.  Right Ear: External ear  normal.  Left Ear: External ear normal.  Nose: Nose normal.  Mouth/Throat: Oropharynx is clear and moist.  Pulmonary/Chest: Effort normal and breath sounds normal.  Neurological: He is alert and oriented to person, place, and time.  Skin: Skin is warm and dry. No rash noted. He is not diaphoretic. No erythema. No pallor.  Redness, edema, pain around nailbed of left middle finger. No warmth felt  Psychiatric: He has a normal mood and affect. His behavior is normal. Judgment and thought content normal.  Nursing note and vitals reviewed.     Assessment & Plan:  1. Paronychia, left -Procedure:  Incision and drainage of abscess Risks, benefits, and alternatives explained and consent obtained. Time out conducted. Surface cleaned with alcohol and betadine  0.3cc lidocaine withOUT epinephine infiltrated around abscess. Adequate anesthesia ensured. Area prepped and draped in a sterile fashion. #15 blade used to make a  stab incision into abscess. Pus expressed with pressure.. Further purulence expressed. Hemostasis achieved. Pt stable. Aftercare and follow-up advised.  - Cephalexin 500 MG tablet; Take 1 tablet (500 mg total) by mouth 3 (three) times daily.  Dispense: 30 tablet; Refill: 0

## 2015-04-18 NOTE — Patient Instructions (Addendum)
It was great seeing you again.  I have sent in a prescription for Keflex, take this three times a day for 10 day. Soak your finger in Epsom salt and keep your finger clean and dry. You can apply neosporin   Follow up with any signs of infection   Paronychia Paronychia is an infection of the skin that surrounds a nail. It usually affects the skin around a fingernail, but it may also occur near a toenail. It often causes pain and swelling around the nail. This condition may come on suddenly or develop over a longer period. In some cases, a collection of pus (abscess) can form near or under the nail. Usually, paronychia is not serious and it clears up with treatment. CAUSES This condition may be caused by bacteria or fungi. It is commonly caused by either Streptococcus or Staphylococcus bacteria. The bacteria or fungi often cause the infection by getting into the affected area through an opening in the skin, such as a cut or a hangnail. RISK FACTORS This condition is more likely to develop in:  People who get their hands wet often, such as those who work as Designer, industrial/product, bartenders, or nurses.  People who bite their fingernails or suck their thumbs.  People who trim their nails too short.  People who have hangnails or injured fingertips.  People who get manicures.  People who have diabetes. SYMPTOMS Symptoms of this condition include:  Redness and swelling of the skin near the nail.  Tenderness around the nail when you touch the area.  Pus-filled bumps under the cuticle. The cuticle is the skin at the base or sides of the nail.  Fluid or pus under the nail.  Throbbing pain in the area. DIAGNOSIS This condition is usually diagnosed with a physical exam. In some cases, a sample of pus may be taken from an abscess to be tested in a lab. This can help to determine what type of bacteria or fungi is causing the condition. TREATMENT Treatment for this condition depends on the cause and  severity of the condition. If the condition is mild, it may clear up on its own in a few days. Your health care provider may recommend soaking the affected area in warm water a few times a day. When treatment is needed, the options may include:  Antibiotic medicine, if the condition is caused by a bacterial infection.  Antifungal medicine, if the condition is caused by a fungal infection.  Incision and drainage, if an abscess is present. In this procedure, the health care provider will cut open the abscess so the pus can drain out. HOME CARE INSTRUCTIONS  Soak the affected area in warm water if directed to do so by your health care provider. You may be told to do this for 20 minutes, 2-3 times a day. Keep the area dry in between soakings.  Take medicines only as directed by your health care provider.  If you were prescribed an antibiotic medicine, finish all of it even if you start to feel better.  Keep the affected area clean.  Do not try to drain a fluid-filled bump yourself.  If you will be washing dishes or performing other tasks that require your hands to get wet, wear rubber gloves. You should also wear gloves if your hands might come in contact with irritating substances, such as cleaners or chemicals.  Follow your health care provider's instructions about:  Wound care.  Bandage (dressing) changes and removal. SEEK MEDICAL CARE IF:  Your  symptoms get worse or do not improve with treatment.  You have a fever or chills.  You have redness spreading from the affected area.  You have continued or increased fluid, blood, or pus coming from the affected area.  Your finger or knuckle becomes swollen or is difficult to move.   This information is not intended to replace advice given to you by your health care provider. Make sure you discuss any questions you have with your health care provider.   Document Released: 07/15/2000 Document Revised: 06/05/2014 Document Reviewed:  12/27/2013 Elsevier Interactive Patient Education [employer].

## 2015-04-29 DIAGNOSIS — Z0000 Encounter for general adult medical examination without abnormal findings: Secondary | ICD-10-CM | POA: Diagnosis not present

## 2015-04-29 DIAGNOSIS — N400 Enlarged prostate without lower urinary tract symptoms: Secondary | ICD-10-CM | POA: Diagnosis not present

## 2015-04-29 DIAGNOSIS — N5201 Erectile dysfunction due to arterial insufficiency: Secondary | ICD-10-CM | POA: Diagnosis not present

## 2015-05-03 ENCOUNTER — Other Ambulatory Visit (INDEPENDENT_AMBULATORY_CARE_PROVIDER_SITE_OTHER): Payer: Medicare Other

## 2015-05-03 DIAGNOSIS — Z0000 Encounter for general adult medical examination without abnormal findings: Secondary | ICD-10-CM

## 2015-05-03 LAB — CBC WITH DIFFERENTIAL/PLATELET
Basophils Absolute: 0 10*3/uL (ref 0.0–0.1)
Basophils Relative: 0.4 % (ref 0.0–3.0)
EOS PCT: 1 % (ref 0.0–5.0)
Eosinophils Absolute: 0 10*3/uL (ref 0.0–0.7)
HEMATOCRIT: 43.4 % (ref 39.0–52.0)
Hemoglobin: 14.7 g/dL (ref 13.0–17.0)
LYMPHS ABS: 1.6 10*3/uL (ref 0.7–4.0)
Lymphocytes Relative: 41.9 % (ref 12.0–46.0)
MCHC: 34 g/dL (ref 30.0–36.0)
MCV: 91 fl (ref 78.0–100.0)
Monocytes Absolute: 0.4 10*3/uL (ref 0.1–1.0)
Monocytes Relative: 10.3 % (ref 3.0–12.0)
NEUTROS PCT: 46.4 % (ref 43.0–77.0)
Neutro Abs: 1.8 10*3/uL (ref 1.4–7.7)
Platelets: 169 10*3/uL (ref 150.0–400.0)
RBC: 4.77 Mil/uL (ref 4.22–5.81)
RDW: 13.7 % (ref 11.5–15.5)
WBC: 3.9 10*3/uL — ABNORMAL LOW (ref 4.0–10.5)

## 2015-05-03 LAB — HEPATIC FUNCTION PANEL
ALBUMIN: 4.8 g/dL (ref 3.5–5.2)
ALT: 14 U/L (ref 0–53)
AST: 19 U/L (ref 0–37)
Alkaline Phosphatase: 74 U/L (ref 39–117)
Bilirubin, Direct: 0.2 mg/dL (ref 0.0–0.3)
TOTAL PROTEIN: 6.7 g/dL (ref 6.0–8.3)
Total Bilirubin: 0.9 mg/dL (ref 0.2–1.2)

## 2015-05-03 LAB — POC URINALSYSI DIPSTICK (AUTOMATED)
Bilirubin, UA: NEGATIVE
Glucose, UA: NEGATIVE
Ketones, UA: NEGATIVE
Leukocytes, UA: NEGATIVE
NITRITE UA: NEGATIVE
PH UA: 6
Protein, UA: NEGATIVE
RBC UA: NEGATIVE
SPEC GRAV UA: 1.02
Urobilinogen, UA: 0.2

## 2015-05-03 LAB — BASIC METABOLIC PANEL
BUN: 13 mg/dL (ref 6–23)
CO2: 32 mEq/L (ref 19–32)
CREATININE: 0.86 mg/dL (ref 0.40–1.50)
Calcium: 9.9 mg/dL (ref 8.4–10.5)
Chloride: 104 mEq/L (ref 96–112)
GFR: 94.24 mL/min (ref >60.00)
GLUCOSE: 100 mg/dL — AB (ref 70–99)
POTASSIUM: 4.1 meq/L (ref 3.5–5.1)
Sodium: 141 mEq/L (ref 135–145)

## 2015-05-03 LAB — LIPID PANEL
Cholesterol: 199 mg/dL (ref 0–200)
HDL: 56.7 mg/dL (ref >39.00)
LDL Cholesterol: 129 mg/dL — ABNORMAL HIGH (ref 0–99)
NONHDL: 142.37
Total CHOL/HDL Ratio: 4
Triglycerides: 69 mg/dL (ref 0.0–149.0)
VLDL: 13.8 mg/dL (ref 0.0–40.0)

## 2015-05-03 LAB — TSH: TSH: 1.8 u[IU]/mL (ref 0.35–4.50)

## 2015-05-03 LAB — PSA: PSA: 4.26 ng/mL — ABNORMAL HIGH (ref 0.10–4.00)

## 2015-05-10 ENCOUNTER — Encounter: Payer: Medicare Other | Admitting: Internal Medicine

## 2015-05-13 ENCOUNTER — Encounter: Payer: Self-pay | Admitting: Internal Medicine

## 2015-05-13 ENCOUNTER — Ambulatory Visit (INDEPENDENT_AMBULATORY_CARE_PROVIDER_SITE_OTHER): Payer: Medicare Other | Admitting: Internal Medicine

## 2015-05-13 VITALS — BP 108/70 | HR 54 | Temp 97.8°F | Resp 18 | Ht 69.0 in | Wt 161.0 lb

## 2015-05-13 DIAGNOSIS — R972 Elevated prostate specific antigen [PSA]: Secondary | ICD-10-CM | POA: Diagnosis not present

## 2015-05-13 DIAGNOSIS — Z0000 Encounter for general adult medical examination without abnormal findings: Secondary | ICD-10-CM

## 2015-05-13 NOTE — Progress Notes (Signed)
Pre visit review using our clinic review tool, if applicable. No additional management support is needed unless otherwise documented below in the visit note. 

## 2015-05-13 NOTE — Patient Instructions (Signed)
Follow-up PSA and urology consultation as discussed  Health Maintenance, Male A healthy lifestyle and preventative care can promote health and wellness.  Maintain regular health, dental, and eye exams.  Eat a healthy diet. Foods like vegetables, fruits, whole grains, low-fat dairy products, and lean protein foods contain the nutrients you need and are low in calories. Decrease your intake of foods high in solid fats, added sugars, and salt. Get information about a proper diet from your health care provider, if necessary.  Regular physical exercise is one of the most important things you can do for your health. Most adults should get at least 150 minutes of moderate-intensity exercise (any activity that increases your heart rate and causes you to sweat) each week. In addition, most adults need muscle-strengthening exercises on 2 or more days a week.   Maintain a healthy weight. The body mass index (BMI) is a screening tool to identify possible weight problems. It provides an estimate of body fat based on height and weight. Your health care provider can find your BMI and can help you achieve or maintain a healthy weight. For males 20 years and older:  A BMI below 18.5 is considered underweight.  A BMI of 18.5 to 24.9 is normal.  A BMI of 25 to 29.9 is considered overweight.  A BMI of 30 and above is considered obese.  Maintain normal blood lipids and cholesterol by exercising and minimizing your intake of saturated fat. Eat a balanced diet with plenty of fruits and vegetables. Blood tests for lipids and cholesterol should begin at age 23 and be repeated every 5 years. If your lipid or cholesterol levels are high, you are over age 77, or you are at high risk for heart disease, you may need your cholesterol levels checked more frequently.Ongoing high lipid and cholesterol levels should be treated with medicines if diet and exercise are not working.  If you smoke, find out from your health care  provider how to quit. If you do not use tobacco, do not start.  Lung cancer screening is recommended for adults aged 44-80 years who are at high risk for developing lung cancer because of a history of smoking. A yearly low-dose CT scan of the lungs is recommended for people who have at least a 30-pack-year history of smoking and are current smokers or have quit within the past 15 years. A pack year of smoking is smoking an average of 1 pack of cigarettes a day for 1 year (for example, a 30-pack-year history of smoking could mean smoking 1 pack a day for 30 years or 2 packs a day for 15 years). Yearly screening should continue until the smoker has stopped smoking for at least 15 years. Yearly screening should be stopped for people who develop a health problem that would prevent them from having lung cancer treatment.  If you choose to drink alcohol, do not have more than 2 drinks per day. One drink is considered to be 12 oz (360 mL) of beer, 5 oz (150 mL) of wine, or 1.5 oz (45 mL) of liquor.  Avoid the use of street drugs. Do not share needles with anyone. Ask for help if you need support or instructions about stopping the use of drugs.  High blood pressure causes heart disease and increases the risk of stroke. High blood pressure is more likely to develop in:  People who have blood pressure in the end of the normal range (100-139/85-89 mm Hg).  People who are overweight or  obese.  People who are African American.  If you are 78-5 years of age, have your blood pressure checked every 3-5 years. If you are 69 years of age or older, have your blood pressure checked every year. You should have your blood pressure measured twice--once when you are at a hospital or clinic, and once when you are not at a hospital or clinic. Record the average of the two measurements. To check your blood pressure when you are not at a hospital or clinic, you can use:  An automated blood pressure machine at a  pharmacy.  A home blood pressure monitor.  If you are 38-72 years old, ask your health care provider if you should take aspirin to prevent heart disease.  Diabetes screening involves taking a blood sample to check your fasting blood sugar level. This should be done once every 3 years after age 33 if you are at a normal weight and without risk factors for diabetes. Testing should be considered at a younger age or be carried out more frequently if you are overweight and have at least 1 risk factor for diabetes.  Colorectal cancer can be detected and often prevented. Most routine colorectal cancer screening begins at the age of 88 and continues through age 84. However, your health care provider may recommend screening at an earlier age if you have risk factors for colon cancer. On a yearly basis, your health care provider may provide home test kits to check for hidden blood in the stool. A small camera at the end of a tube may be used to directly examine the colon (sigmoidoscopy or colonoscopy) to detect the earliest forms of colorectal cancer. Talk to your health care provider about this at age 22 when routine screening begins. A direct exam of the colon should be repeated every 5-10 years through age 63, unless early forms of precancerous polyps or small growths are found.  People who are at an increased risk for hepatitis B should be screened for this virus. You are considered at high risk for hepatitis B if:  You were born in a country where hepatitis B occurs often. Talk with your health care provider about which countries are considered high risk.  Your parents were born in a high-risk country and you have not received a shot to protect against hepatitis B (hepatitis B vaccine).  You have HIV or AIDS.  You use needles to inject street drugs.  You live with, or have sex with, someone who has hepatitis B.  You are a man who has sex with other men (MSM).  You get hemodialysis  treatment.  You take certain medicines for conditions like cancer, organ transplantation, and autoimmune conditions.  Hepatitis C blood testing is recommended for all people born from [birth year] through 1967 and any individual with known risk factors for hepatitis C.  Healthy men should no longer receive prostate-specific antigen (PSA) blood tests as part of routine cancer screening. Talk to your health care provider about prostate cancer screening.  Testicular cancer screening is not recommended for adolescents or adult males who have no symptoms. Screening includes self-exam, a health care provider exam, and other screening tests. Consult with your health care provider about any symptoms you have or any concerns you have about testicular cancer.  Practice safe sex. Use condoms and avoid high-risk sexual practices to reduce the spread of sexually transmitted infections (STIs).  You should be screened for STIs, including gonorrhea and chlamydia if:  You are sexually active  and are younger than 24 years.  You are older than 24 years, and your health care provider tells you that you are at risk for this type of infection.  Your sexual activity has changed since you were last screened, and you are at an increased risk for chlamydia or gonorrhea. Ask your health care provider if you are at risk.  If you are at risk of being infected with HIV, it is recommended that you take a prescription medicine daily to prevent HIV infection. This is called pre-exposure prophylaxis (PrEP). You are considered at risk if:  You are a man who has sex with other men (MSM).  You are a heterosexual man who is sexually active with multiple partners.  You take drugs by injection.  You are sexually active with a partner who has HIV.  Talk with your health care provider about whether you are at high risk of being infected with HIV. If you choose to begin PrEP, you should first be tested for HIV. You should then be tested  every 3 months for as long as you are taking PrEP.  Use sunscreen. Apply sunscreen liberally and repeatedly throughout the day. You should seek shade when your shadow is shorter than you. Protect yourself by wearing long sleeves, pants, a wide-brimmed hat, and sunglasses year round whenever you are outdoors.  Tell your health care provider of new moles or changes in moles, especially if there is a change in shape or color. Also, tell your health care provider if a mole is larger than the size of a pencil eraser.  A one-time screening for abdominal aortic aneurysm (AAA) and surgical repair of large AAAs by ultrasound is recommended for men aged 107-75 years who are current or former smokers.  Stay current with your vaccines (immunizations).   This information is not intended to replace advice given to you by your health care provider. Make sure you discuss any questions you have with your health care provider.   Document Released: 07/18/2007 Document Revised: 02/09/2014 Document Reviewed: 06/16/2010 Elsevier Interactive Patient Education [employer].

## 2015-05-13 NOTE — Progress Notes (Signed)
Subjective:    Patient ID: SHIVAANSH ZHANG, male    DOB: 1950/04/13, 65 y.o.   MRN: [id number]  HPI  Patient ID: KARLA SUMMERLIN, male   DOB: March 04, 1950, 65 y.o.   MRN: [id number]  Subjective:    Patient ID: ROMAIN KAISER, male    DOB: 1950/11/17, 65 y.o.   MRN: [id number]  HPI 29  -year-old patient who is seen today for an annual exam. He does remarkably well and is on no chronic medications. He has been followed by urology annually after an exam suggested some mild prostatic asymmetry.  He was seen by urology last month.  PSA was not performed due to his scheduled physical.  PSA was elevated Doing quite well without concerns or complaints  Past Medical History  Diagnosis Date  . Hemorrhoids     symptomatic  . Prostate nodule     left  . Abnormal liver function test   . History of hepatitis A     Social History   Social History  . Marital Status: Married    Spouse Name: N/A  . Number of Children: N/A  . Years of Education: N/A   Occupational History  . Not on file.   Social History Main Topics  . Smoking status: Never Smoker   . Smokeless tobacco: Never Used  . Alcohol Use: 8.4 oz/week    14 Cans of beer per week     Comment: 2 daily   . Drug Use: No  . Sexual Activity: Not on file   Other Topics Concern  . Not on file   Social History Narrative   No caffeine     Past Surgical History  Procedure Laterality Date  . Tonsillectomy    . Colonoscopy      Family History  Problem Relation Age of Onset  . Breast cancer Mother   . Heart disease Mother     CHF  . Parkinsonism Mother   . Heart attack Father   . Colon polyps Father   . Heart disease Brother     post CABG  . Colon cancer Neg Hx   . Esophageal cancer Neg Hx   . Stomach cancer Neg Hx   . Prostate cancer Maternal Aunt     No Known Allergies  Current Outpatient Prescriptions on File Prior to Visit  Medication Sig Dispense Refill  . Cholecalciferol (VITAMIN D-3) 1000 UNITS CAPS Take  20,000 Units by mouth once a week.     . hydrocortisone (ANUSOL-HC) 25 MG suppository Place 1 suppository (25 mg total) rectally as needed for itching. 12 suppository 4  . Probiotic Product (PROBIOTIC DAILY PO) Take 2 capsules by mouth once a week.     . triamcinolone cream (KENALOG) 0.1 % APPLY 1 APPLICATION TOPICALLY 2 (TWO) TIMES DAILY. (Patient taking differently: APPLY 1 APPLICATION TOPICALLY AS NEEDED) 80 g 0   No current facility-administered medications on file prior to visit.    BP 108/70 mmHg  Pulse 54  Temp(Src) 97.8 F (36.6 C) (Oral)  Resp 18  Ht 5\' 9"  (1.753 m)  Wt 161 lb (73.029 kg)  BMI 23.76 kg/m2  SpO2 98%      Review of Systems  Constitutional: Negative for fever, chills, activity change, appetite change and fatigue.  HENT: Negative for congestion, dental problem, ear pain, hearing loss, mouth sores, rhinorrhea, sinus pressure, sneezing, tinnitus, trouble swallowing and voice change.   Eyes: Negative for photophobia, pain, redness and visual disturbance.  Respiratory: Negative for  apnea, cough, choking, chest tightness, shortness of breath and wheezing.   Cardiovascular: Negative for chest pain, palpitations and leg swelling.  Gastrointestinal: Negative for nausea, vomiting, abdominal pain, diarrhea, constipation, blood in stool, abdominal distention, anal bleeding and rectal pain.  Genitourinary: Negative for dysuria, urgency, frequency, hematuria, flank pain, decreased urine volume, discharge, penile swelling, scrotal swelling, difficulty urinating, genital sores and testicular pain.  Musculoskeletal: Negative for myalgias, back pain, joint swelling, arthralgias, gait problem, neck pain and neck stiffness.  Skin: Negative for color change, rash and wound.  Neurological: Negative for dizziness, tremors, seizures, syncope, facial asymmetry, speech difficulty, weakness, light-headedness, numbness and headaches.  Hematological: Negative for adenopathy. Does not  bruise/bleed easily.  Psychiatric/Behavioral: Negative for suicidal ideas, hallucinations, behavioral problems, confusion, sleep disturbance, self-injury, dysphoric mood, decreased concentration and agitation. The patient is not nervous/anxious.        Objective:   Physical Exam  Constitutional: He appears well-developed and well-nourished.  HENT:  Head: Normocephalic and atraumatic.  Right Ear: External ear normal.  Left Ear: External ear normal.  Nose: Nose normal.  Mouth/Throat: Oropharynx is clear and moist.  Eyes: Conjunctivae and EOM are normal. Pupils are equal, round, and reactive to light. No scleral icterus.  Neck: Normal range of motion. Neck supple. No JVD present. No thyromegaly present.  Cardiovascular: Regular rhythm, normal heart sounds and intact distal pulses.  Exam reveals no gallop and no friction rub.   No murmur heard. Pulmonary/Chest: Effort normal and breath sounds normal. He exhibits no tenderness.  Abdominal: Soft. Bowel sounds are normal. He exhibits no distension and no mass. There is no tenderness.  Genitourinary: Penis normal.   prostate not reexamined  Musculoskeletal: Normal range of motion. He exhibits no edema or tenderness.  Lymphadenopathy:    He has no cervical adenopathy.  Neurological: He is alert. He has normal reflexes. No cranial nerve deficit. Coordination normal.  Skin: Skin is warm and dry. No rash noted.  Psychiatric: He has a normal mood and affect. His behavior is normal.          Assessment & Plan:   Preventive health examination. Elevated PSA.  Will send copy to urology.  We'll consider a follow-up in 3 months  Return in one year for followup   Review of Systems As above    Objective:   Physical Exam  As above      Assessment & Plan:   As above

## 2015-06-20 DIAGNOSIS — N401 Enlarged prostate with lower urinary tract symptoms: Secondary | ICD-10-CM | POA: Diagnosis not present

## 2015-07-04 DIAGNOSIS — R972 Elevated prostate specific antigen [PSA]: Secondary | ICD-10-CM | POA: Diagnosis not present

## 2015-07-05 ENCOUNTER — Other Ambulatory Visit: Payer: Self-pay | Admitting: Urology

## 2015-07-05 DIAGNOSIS — R972 Elevated prostate specific antigen [PSA]: Secondary | ICD-10-CM

## 2015-07-19 ENCOUNTER — Ambulatory Visit (HOSPITAL_COMMUNITY)
Admission: RE | Admit: 2015-07-19 | Discharge: 2015-07-19 | Disposition: A | Discharge status: Home / Self Care | Payer: Medicare Other | Source: Ambulatory Visit | Attending: Urology | Admitting: Urology

## 2015-07-19 DIAGNOSIS — N400 Enlarged prostate without lower urinary tract symptoms: Secondary | ICD-10-CM | POA: Diagnosis not present

## 2015-07-19 DIAGNOSIS — N402 Nodular prostate without lower urinary tract symptoms: Secondary | ICD-10-CM | POA: Diagnosis not present

## 2015-07-19 DIAGNOSIS — R972 Elevated prostate specific antigen [PSA]: Secondary | ICD-10-CM | POA: Diagnosis not present

## 2015-07-19 LAB — POCT I-STAT CREATININE: Creatinine, Ser: 0.7 mg/dL (ref 0.61–1.24)

## 2015-07-19 MED ORDER — GADOBENATE DIMEGLUMINE 529 MG/ML IV SOLN
15.0000 mL | Freq: Once | INTRAVENOUS | Status: AC | PRN
  Administered 2015-07-19: 14 mL via INTRAVENOUS

## 2015-08-23 DIAGNOSIS — C61 Malignant neoplasm of prostate: Secondary | ICD-10-CM | POA: Diagnosis not present

## 2015-08-23 DIAGNOSIS — R972 Elevated prostate specific antigen [PSA]: Secondary | ICD-10-CM | POA: Diagnosis not present

## 2015-09-19 DIAGNOSIS — C61 Malignant neoplasm of prostate: Secondary | ICD-10-CM | POA: Diagnosis not present

## 2015-11-19 DIAGNOSIS — H5203 Hypermetropia, bilateral: Secondary | ICD-10-CM | POA: Diagnosis not present

## 2015-11-19 DIAGNOSIS — H52223 Regular astigmatism, bilateral: Secondary | ICD-10-CM | POA: Diagnosis not present

## 2015-11-19 DIAGNOSIS — H524 Presbyopia: Secondary | ICD-10-CM | POA: Diagnosis not present

## 2015-11-19 DIAGNOSIS — H2513 Age-related nuclear cataract, bilateral: Secondary | ICD-10-CM | POA: Diagnosis not present

## 2016-03-09 DIAGNOSIS — C61 Malignant neoplasm of prostate: Secondary | ICD-10-CM | POA: Diagnosis not present

## 2016-03-16 DIAGNOSIS — C61 Malignant neoplasm of prostate: Secondary | ICD-10-CM | POA: Diagnosis not present

## 2016-06-08 DIAGNOSIS — C61 Malignant neoplasm of prostate: Secondary | ICD-10-CM | POA: Diagnosis not present

## 2016-06-08 LAB — PSA: PSA: 4.31

## 2016-06-18 DIAGNOSIS — C61 Malignant neoplasm of prostate: Secondary | ICD-10-CM | POA: Diagnosis not present

## 2016-06-22 ENCOUNTER — Encounter: Payer: Self-pay | Admitting: Family Medicine

## 2016-09-07 NOTE — Progress Notes (Addendum)
Subjective:   John [name] is a 66 y.o. male who presents for Medicare Annual/Subsequent preventive examination.  The Patient was informed that the wellness visit is to identify future health risk and educate and initiate measures that can reduce risk for increased disease through the lifespan.    Annual Wellness Assessment  Reports health as fair  Is being followed for possible prostate cancer q 6 months by  [facility]    Patient concerns over the last 5 years: States he has 5 year period of intermittent chronic fatigue.  States he will experience a total lack of energy; general body discomfort; low grade nausea but has gotten worse over the last 5 years, especially this year with episodes 2 times per week.  Goes in cycles; some morning, he does not feel like getting out of bed; from time to time, he has some dizziness  States he has to lie down for 20 minutes after taking a shower and this may happen 2 times per month.  Does not do his exercise on these days Discussed getting an apt with Dr Tonny Branch father had major MI 20 and brother had triple bypass  HX:  (hx of cruise in the [place] x 66 yo) some GI symptoms post cruise,  Was referred to GI and then developed rash and was seen by dermatologist   Will make an apt with Dr. Raliegh Ip for fup  Preventive Screening -Counseling & Management  Medicare Annual Preventive Care Visit - Subsequent Appears healthy  Last OV 05/2015 w Dr. Raliegh Ip  06/2016 PSA 4.31 / states of the sub-panels were worse    Other Labs; HDL 56; LDL 129 and trig 87  IMM Zoster 2014; educated regarding the shingrix Tdap due - may take when he sees Dr. Raliegh Ip- he will check benefit  Colonoscopy 08/2011 - repeat in 10 years   Dana as poor, fair, good or great? "ok"  But concerned about fatigue as stated above  ETOH 2 beers daily  No smoking hx  VS reviewed;   Diet; eats healthy x 20 years ago Fruits and vegetables  Minimal animal meat; lots of  fish   BMI 22   Exercise  Did have regular exercise routine;  Hardly no cardio; minimal amt of 30 minutes 3 days a week of weights;  Does a lot of physical work around the home   Stressors: health concerns   Advanced Directives - given info    Patient Care Team: Marletta Lor, MD as PCP - General Assessed for additional providers  Immunization History  Administered Date(s) Administered  . Influenza Split 01/02/2011, 10/13/2011  . Influenza Whole 10/21/2007, 01/20/2010  . Influenza, High Dose Seasonal PF 11/28/2014  . Influenza,inj,Quad PF,36+ Mos 11/08/2012, 12/20/2013  . Pneumococcal Conjugate-13 12/20/2013  . Pneumococcal Polysaccharide-23 03/05/1996, 10/13/2011  . Td 05/04/2006  . Zoster 09/09/2012   Required Immunizations needed today  Screening test up to date or reviewed for plan of completion Health Maintenance Due  Topic Date Due  . TETANUS/TDAP  05/03/2016  . INFLUENZA VACCINE  09/02/2016    Cardiac Risk Factors include: advanced age (>44men, >31 women);family history of premature cardiovascular disease;male gender     Objective:    Vitals: BP (!) 118/58   Pulse (!) 56   Ht 5\' 9"  (1.753 m)   Wt 155 lb (70.3 kg)   SpO2 99%   BMI 22.89 kg/m   Body mass index is 22.89 kg/m.  Tobacco History  Smoking  Status  . Never Smoker  Smokeless Tobacco  . Never Used     Counseling given: Yes   Past Medical History:  Diagnosis Date  . Abnormal liver function test   . Hemorrhoids    symptomatic  . History of hepatitis A   . Prostate nodule    left   Past Surgical History:  Procedure Laterality Date  . COLONOSCOPY    . TONSILLECTOMY     Family History  Problem Relation Age of Onset  . Breast cancer Mother   . Heart disease Mother        CHF  . Parkinsonism Mother   . Heart attack Father   . Colon polyps Father   . Heart disease Brother        post CABG  . Prostate cancer Maternal Aunt   . Colon cancer Neg Hx   . Esophageal  cancer Neg Hx   . Stomach cancer Neg Hx    History  Sexual Activity  . Sexual activity: Not on file    Outpatient Encounter Prescriptions as of 09/08/2016  Medication Sig  . Cholecalciferol (VITAMIN D-3) 1000 UNITS CAPS Take 20,000 Units by mouth once a week.   . hydrocortisone (ANUSOL-HC) 25 MG suppository Place 1 suppository (25 mg total) rectally as needed for itching.  . Probiotic Product (PROBIOTIC DAILY PO) Take 2 capsules by mouth once a week.   . sildenafil (REVATIO) 20 MG tablet Take 2-5 tablets by mouth as needed  . triamcinolone cream (KENALOG) 0.1 % APPLY 1 APPLICATION TOPICALLY 2 (TWO) TIMES DAILY. (Patient taking differently: APPLY 1 APPLICATION TOPICALLY AS NEEDED)   No facility-administered encounter medications on file as of 09/08/2016.     Activities of Daily Living In your present state of health, do you have any difficulty performing the following activities: 09/08/2016  Hearing? N  Vision? N  Difficulty concentrating or making decisions? N  Walking or climbing stairs? N  Dressing or bathing? N  Doing errands, shopping? N  Preparing Food and eating ? N  Using the Toilet? N  In the past six months, have you accidently leaked urine? N  Do you have problems with loss of bowel control? N  Managing your Medications? N  Managing your Finances? N  Housekeeping or managing your Housekeeping? N  Some recent data might be hidden    Patient Care Team: Marletta Lor, MD as PCP - General   Assessment:     Exercise Activities and Dietary recommendations Current Exercise Habits: Structured exercise class, Type of exercise: strength training/weights, Time (Minutes): 60, Frequency (Times/Week): 3, Weekly Exercise (Minutes/Week): 180, Intensity: Moderate  Goals    . patient stated          To discover reasons for fatigue changes in health       Fall Risk Fall Risk  09/08/2016 05/13/2015 12/20/2013 11/08/2012  Falls in the past year? No No No No   Depression  Screen PHQ 2/9 Scores 09/08/2016 05/13/2015 12/20/2013 11/08/2012  PHQ - 2 Score 0 0 0 0    Cognitive Function MMSE - Mini Mental State Exam 09/08/2016  Not completed: (No Data)   Ad8 score reviewed for issues:  Issues making decisions:  Less interest in hobbies / activities:  Repeats questions, stories (family complaining):  Trouble using ordinary gadgets (microwave, computer, phone):  Forgets the month or year:   Mismanaging finances:   Remembering appts:  Daily problems with thinking and/or memory: Ad8 score is=0  Immunization History  Administered Date(s) Administered  . Influenza Split 01/02/2011, 10/13/2011  . Influenza Whole 10/21/2007, 01/20/2010  . Influenza, High Dose Seasonal PF 11/28/2014  . Influenza,inj,Quad PF,36+ Mos 11/08/2012, 12/20/2013  . Pneumococcal Conjugate-13 12/20/2013  . Pneumococcal Polysaccharide-23 03/05/1996, 10/13/2011  . Td 05/04/2006  . Zoster 09/09/2012   Screening Tests Health Maintenance  Topic Date Due  . TETANUS/TDAP  05/03/2016  . INFLUENZA VACCINE  09/02/2016  . PNA vac Low Risk Adult (2 of 2 - PPSV23) 10/12/2016  . COLONOSCOPY  08/24/2021  . Hepatitis C Screening  Completed      Plan:    PCP Notes   Health Maintenance Agreed to review and complete AD, copy given Educated regarding need for Tdap will check benefits and may take when he comes it to see Dr. Mertie Moores regarding shingrix  Abnormal Screens  PSA [facility] following  Referrals  none  Patient concerns; Vague symptoms of fatigue which disables him for at least 1/2 day.. Feels his symptoms are worse over the last year  Nurse Concerns; Following [facility] for PSA;    Next PCP apt Will fup with Dr. Raliegh Ip for annual medical review and symptoms of inc'd fatigue, dizziness at times , body aches and mild nausea   I have personally reviewed and noted the following in the patient's chart:   . Medical and social history . Use of alcohol, tobacco or illicit  drugs  . Current medications and supplements . Functional ability and status . Nutritional status . Physical activity . Advanced directives . List of other physicians . Hospitalizations, surgeries, and ER visits in previous 12 months . Vitals . Screenings to include cognitive, depression, and falls . Referrals and appointments  In addition, I have reviewed and discussed with patient certain preventive protocols, quality metrics, and best practice recommendations. A written personalized care plan for preventive services as well as general preventive health recommendations were provided to patient.     HGDJM,EQAST, RN  09/08/2016  Results of this patient's annual Medicare wellness visit  reviewed and agree with findings.  Nyoka Cowden

## 2016-09-08 ENCOUNTER — Ambulatory Visit: Payer: Medicare Other

## 2016-09-08 ENCOUNTER — Ambulatory Visit (INDEPENDENT_AMBULATORY_CARE_PROVIDER_SITE_OTHER): Payer: Medicare Other

## 2016-09-08 VITALS — BP 118/58 | HR 56 | Ht 69.0 in | Wt 155.0 lb

## 2016-09-08 DIAGNOSIS — Z0000 Encounter for general adult medical examination without abnormal findings: Secondary | ICD-10-CM

## 2016-09-08 NOTE — Patient Instructions (Addendum)
John [name] , Thank you for taking time to come for your Medicare Wellness Visit. I appreciate your ongoing commitment to your health goals. Please review the following plan we discussed and let me know if I can assist you in the future.   Will try to complete AD; Given copy  Referred to [facility] for questions [facility] offers free advance directive forms, as well as assistance in completing the forms themselves. For assistance, contact the Spiritual Care Department at [phone], or the Clinical Social Work Department at [phone].  You can have hearing screen any time  [email or web address]  A Tetanus is recommended every 10 years. Medicare covers a tetanus if you have a cut or wound; otherwise, there may be a charge. If you had not had a tetanus with pertusses, known as the Tdap, you can take this anytime.   Shingrix is a vaccine for the prevention of Shingles in Adults 50 and older.  If you are on Medicare, you can request a prescription from your doctor to be filled at a pharmacy.  Please check with your benefits regarding applicable copays or out of pocket expenses.  The Shingrix is given in 2 vaccines approx 8 weeks apart. You must receive the 2nd dose prior to 6 months from receipt of the first.   Will schedule with Dr. Raliegh Ip when you leave today for a medical management exam     These are the goals we discussed: Goals    . patient stated          To discover reasons for fatigue changes in health        This is a list of the screening recommended for you and due dates:  Health Maintenance  Topic Date Due  . Tetanus Vaccine  05/03/2016  . Flu Shot  09/02/2016  . Pneumonia vaccines (2 of 2 - PPSV23) 10/12/2016  . Colon Cancer Screening  08/24/2021  .  Hepatitis C: One time screening is recommended by Center for Disease Control  (CDC) for  adults born from [birth year] through 1967.   Completed   Fall Prevention in the Home Falls can cause injuries and can affect  people from all age groups. There are many simple things that you can do to make your home safe and to help prevent falls. What can I do on the outside of my home?  Regularly repair the edges of walkways and driveways and fix any cracks.  Remove high doorway thresholds.  Trim any shrubbery on the main path into your home.  Use bright outdoor lighting.  Clear walkways of debris and clutter, including tools and rocks.  Regularly check that handrails are securely fastened and in good repair. Both sides of any steps should have handrails.  Install guardrails along the edges of any raised decks or porches.  Have leaves, snow, and ice cleared regularly.  Use sand or salt on walkways during winter months.  In the garage, clean up any spills right away, including grease or oil spills. What can I do in the bathroom?  Use night lights.  Install grab bars by the toilet and in the tub and shower. Do not use towel bars as grab bars.  Use non-skid mats or decals on the floor of the tub or shower.  If you need to sit down while you are in the shower, use a plastic, non-slip stool.  Keep the floor dry. Immediately clean up any water that spills on the floor.  Remove soap buildup in  the tub or shower on a regular basis.  Attach bath mats securely with double-sided non-slip rug tape.  Remove throw rugs and other tripping hazards from the floor. What can I do in the bedroom?  Use night lights.  Make sure that a bedside light is easy to reach.  Do not use oversized bedding that drapes onto the floor.  Have a firm chair that has side arms to use for getting dressed.  Remove throw rugs and other tripping hazards from the floor. What can I do in the kitchen?  Clean up any spills right away.  Avoid walking on wet floors.  Place frequently used items in easy-to-reach places.  If you need to reach for something above you, use a sturdy step stool that has a grab bar.  Keep electrical  cables out of the way.  Do not use floor polish or wax that makes floors slippery. If you have to use wax, make sure that it is non-skid floor wax.  Remove throw rugs and other tripping hazards from the floor. What can I do in the stairways?  Do not leave any items on the stairs.  Make sure that there are handrails on both sides of the stairs. Fix handrails that are broken or loose. Make sure that handrails are as long as the stairways.  Check any carpeting to make sure that it is firmly attached to the stairs. Fix any carpet that is loose or worn.  Avoid having throw rugs at the top or bottom of stairways, or secure the rugs with carpet tape to prevent them from moving.  Make sure that you have a light switch at the top of the stairs and the bottom of the stairs. If you do not have them, have them installed. What are some other fall prevention tips?  Wear closed-toe shoes that fit well and support your feet. Wear shoes that have rubber soles or low heels.  When you use a stepladder, make sure that it is completely opened and that the sides are firmly locked. Have someone hold the ladder while you are using it. Do not climb a closed stepladder.  Add color or contrast paint or tape to grab bars and handrails in your home. Place contrasting color strips on the first and last steps.  Use mobility aids as needed, such as canes, walkers, scooters, and crutches.  Turn on lights if it is dark. Replace any light bulbs that burn out.  Set up furniture so that there are clear paths. Keep the furniture in the same spot.  Fix any uneven floor surfaces.  Choose a carpet design that does not hide the edge of steps of a stairway.  Be aware of any and all pets.  Review your medicines with your healthcare provider. Some medicines can cause dizziness or changes in blood pressure, which increase your risk of falling. Talk with your health care provider about other ways that you can decrease your  risk of falls. This may include working with a physical therapist or trainer to improve your strength, balance, and endurance. This information is not intended to replace advice given to you by your health care provider. Make sure you discuss any questions you have with your health care provider. Document Released: 01/09/2002 Document Revised: 06/18/2015 Document Reviewed: 02/23/2014 Elsevier Interactive Patient Education  2017 [place] Maintenance, Male A healthy lifestyle and preventive care is important for your health and wellness. Ask your health care provider about what schedule of  regular examinations is right for you. What should I know about weight and diet? Eat a Healthy Diet  Eat plenty of vegetables, fruits, whole grains, low-fat dairy products, and lean protein.  Do not eat a lot of foods high in solid fats, added sugars, or salt.  Maintain a Healthy Weight Regular exercise can help you achieve or maintain a healthy weight. You should:  Do at least 150 minutes of exercise each week. The exercise should increase your heart rate and make you sweat (moderate-intensity exercise).  Do strength-training exercises at least twice a week.  Watch Your Levels of Cholesterol and Blood Lipids  Have your blood tested for lipids and cholesterol every 5 years starting at 68 years of age. If you are at high risk for heart disease, you should start having your blood tested when you are 68 years old. You may need to have your cholesterol levels checked more often if: ? Your lipid or cholesterol levels are high. ? You are older than 68 years of age. ? You are at high risk for heart disease.  What should I know about cancer screening? Many types of cancers can be detected early and may often be prevented. Lung Cancer  You should be screened every year for lung cancer if: ? You are a current smoker who has smoked for at least 30 years. ? You are a former smoker who has quit  within the past 15 years.  Talk to your health care provider about your screening options, when you should start screening, and how often you should be screened.  Colorectal Cancer  Routine colorectal cancer screening usually begins at 68 years of age and should be repeated every 5-10 years until you are 68 years old. You may need to be screened more often if early forms of precancerous polyps or small growths are found. Your health care provider may recommend screening at an earlier age if you have risk factors for colon cancer.  Your health care provider may recommend using home test kits to check for hidden blood in the stool.  A small camera at the end of a tube can be used to examine your colon (sigmoidoscopy or colonoscopy). This checks for the earliest forms of colorectal cancer.  Prostate and Testicular Cancer  Depending on your age and overall health, your health care provider may do certain tests to screen for prostate and testicular cancer.  Talk to your health care provider about any symptoms or concerns you have about testicular or prostate cancer.  Skin Cancer  Check your skin from head to toe regularly.  Tell your health care provider about any new moles or changes in moles, especially if: ? There is a change in a mole's size, shape, or color. ? You have a mole that is larger than a pencil eraser.  Always use sunscreen. Apply sunscreen liberally and repeat throughout the day.  Protect yourself by wearing long sleeves, pants, a wide-brimmed hat, and sunglasses when outside.  What should I know about heart disease, diabetes, and high blood pressure?  If you are 10-82 years of age, have your blood pressure checked every 3-5 years. If you are 50 years of age or older, have your blood pressure checked every year. You should have your blood pressure measured twice-once when you are at a hospital or clinic, and once when you are not at a hospital or clinic. Record the average  of the two measurements. To check your blood pressure when you are not  at a hospital or clinic, you can use: ? An automated blood pressure machine at a pharmacy. ? A home blood pressure monitor.  Talk to your health care provider about your target blood pressure.  If you are between 70-60 years old, ask your health care provider if you should take aspirin to prevent heart disease.  Have regular diabetes screenings by checking your fasting blood sugar level. ? If you are at a normal weight and have a low risk for diabetes, have this test once every three years after the age of 67. ? If you are overweight and have a high risk for diabetes, consider being tested at a younger age or more often.  A one-time screening for abdominal aortic aneurysm (AAA) by ultrasound is recommended for men aged 28-75 years who are current or former smokers. What should I know about preventing infection? Hepatitis B If you have a higher risk for hepatitis B, you should be screened for this virus. Talk with your health care provider to find out if you are at risk for hepatitis B infection. Hepatitis C Blood testing is recommended for:  Everyone born from [birth year] through 1967.  Anyone with known risk factors for hepatitis C.  Sexually Transmitted Diseases (STDs)  You should be screened each year for STDs including gonorrhea and chlamydia if: ? You are sexually active and are younger than 68 years of age. ? You are older than 68 years of age and your health care provider tells you that you are at risk for this type of infection. ? Your sexual activity has changed since you were last screened and you are at an increased risk for chlamydia or gonorrhea. Ask your health care provider if you are at risk.  Talk with your health care provider about whether you are at high risk of being infected with HIV. Your health care provider may recommend a prescription medicine to help prevent HIV infection.  What else can I  do?  Schedule regular health, dental, and eye exams.  Stay current with your vaccines (immunizations).  Do not use any tobacco products, such as cigarettes, chewing tobacco, and e-cigarettes. If you need help quitting, ask your health care provider.  Limit alcohol intake to no more than 2 drinks per day. One drink equals 12 ounces of beer, 5 ounces of wine, or 1 ounces of hard liquor.  Do not use street drugs.  Do not share needles.  Ask your health care provider for help if you need support or information about quitting drugs.  Tell your health care provider if you often feel depressed.  Tell your health care provider if you have ever been abused or do not feel safe at home. This information is not intended to replace advice given to you by your health care provider. Make sure you discuss any questions you have with your health care provider. Document Released: 07/18/2007 Document Revised: 09/18/2015 Document Reviewed: 10/23/2014 Elsevier Interactive Patient Education  [employer].

## 2016-09-21 ENCOUNTER — Other Ambulatory Visit: Payer: Self-pay | Admitting: Adult Health

## 2016-10-08 ENCOUNTER — Telehealth: Payer: Self-pay | Admitting: Internal Medicine

## 2016-10-08 NOTE — Telephone Encounter (Signed)
The patient is wanting to know if he needs immunizations before she goes on a trip September 24th   [place]

## 2016-10-09 NOTE — Telephone Encounter (Signed)
Please advise 

## 2016-10-12 NOTE — Telephone Encounter (Signed)
Left a very detailed voice message that there is no special immunizations needed unless recommended by their travel agent

## 2016-10-12 NOTE — Telephone Encounter (Signed)
No special immunizations needed unless recommended by their travel agent

## 2016-10-22 ENCOUNTER — Encounter: Payer: Self-pay | Admitting: Internal Medicine

## 2016-12-07 DIAGNOSIS — C61 Malignant neoplasm of prostate: Secondary | ICD-10-CM | POA: Diagnosis not present

## 2016-12-16 ENCOUNTER — Other Ambulatory Visit: Payer: Self-pay | Admitting: Urology

## 2016-12-16 DIAGNOSIS — C61 Malignant neoplasm of prostate: Secondary | ICD-10-CM

## 2017-01-08 DIAGNOSIS — C61 Malignant neoplasm of prostate: Secondary | ICD-10-CM | POA: Diagnosis not present

## 2017-01-12 ENCOUNTER — Other Ambulatory Visit: Payer: Self-pay

## 2017-01-12 ENCOUNTER — Encounter (HOSPITAL_BASED_OUTPATIENT_CLINIC_OR_DEPARTMENT_OTHER): Payer: Self-pay | Admitting: *Deleted

## 2017-01-12 NOTE — Progress Notes (Signed)
SPOKE W/ PT VIA PHONE FOR PRE-OP INTERVIEW.  NPO AFTER MN.  ARRIVE AT 0530.  NEEDS HG.  WILL TAKE AM MEDS W/ SIPS OF WATER AND DO FLEET ENEMA.

## 2017-01-14 ENCOUNTER — Other Ambulatory Visit: Payer: Self-pay | Admitting: Radiology

## 2017-01-14 NOTE — Anesthesia Preprocedure Evaluation (Addendum)
Anesthesia Evaluation  Patient identified by MRN, date of birth, ID band Patient awake    Reviewed: Allergy & Precautions, Patient's Chart, lab work & pertinent test results  History of Anesthesia Complications Negative for: history of anesthetic complications  Airway Mallampati: II  TM Distance: >3 FB Neck ROM: Full    Dental no notable dental hx. (+) Dental Advisory Given   Pulmonary neg pulmonary ROS,    Pulmonary exam normal        Cardiovascular negative cardio ROS Normal cardiovascular exam     Neuro/Psych negative neurological ROS     GI/Hepatic negative GI ROS, Neg liver ROS,   Endo/Other  negative endocrine ROS  Renal/GU negative Renal ROS     Musculoskeletal negative musculoskeletal ROS (+)   Abdominal   Peds  Hematology negative hematology ROS (+)   Anesthesia Other Findings Day of surgery medications reviewed with the patient.  Reproductive/Obstetrics                            Anesthesia Physical Anesthesia Plan  ASA: II  Anesthesia Plan: General   Post-op Pain Management:    Induction: Intravenous  PONV Risk Score and Plan: 3 and Ondansetron, Dexamethasone and Scopolamine patch - Pre-op  Airway Management Planned: LMA  Additional Equipment:   Intra-op Plan:   Post-operative Plan: Extubation in OR  Informed Consent: I have reviewed the patients History and Physical, chart, labs and discussed the procedure including the risks, benefits and alternatives for the proposed anesthesia with the patient or authorized representative who has indicated his/her understanding and acceptance.   Dental advisory given  Plan Discussed with: CRNA and Anesthesiologist  Anesthesia Plan Comments:        Anesthesia Quick Evaluation

## 2017-01-15 ENCOUNTER — Ambulatory Visit (HOSPITAL_BASED_OUTPATIENT_CLINIC_OR_DEPARTMENT_OTHER)
Admission: RE | Admit: 2017-01-15 | Discharge: 2017-01-15 | Disposition: A | Discharge status: Home / Self Care | Payer: Medicare Other | Source: Ambulatory Visit | Attending: Urology | Admitting: Urology

## 2017-01-15 ENCOUNTER — Encounter (HOSPITAL_BASED_OUTPATIENT_CLINIC_OR_DEPARTMENT_OTHER)
Admission: RE | Disposition: A | Discharge status: Home / Self Care | Payer: Self-pay | Source: Ambulatory Visit | Attending: Urology

## 2017-01-15 ENCOUNTER — Encounter (HOSPITAL_BASED_OUTPATIENT_CLINIC_OR_DEPARTMENT_OTHER): Payer: Self-pay

## 2017-01-15 ENCOUNTER — Ambulatory Visit (HOSPITAL_BASED_OUTPATIENT_CLINIC_OR_DEPARTMENT_OTHER): Payer: Medicare Other | Admitting: Anesthesiology

## 2017-01-15 ENCOUNTER — Ambulatory Visit (HOSPITAL_COMMUNITY)
Admission: RE | Admit: 2017-01-15 | Discharge: 2017-01-15 | Disposition: A | Discharge status: Home / Self Care | Payer: Medicare Other | Source: Ambulatory Visit | Attending: Urology | Admitting: Urology

## 2017-01-15 DIAGNOSIS — C61 Malignant neoplasm of prostate: Secondary | ICD-10-CM

## 2017-01-15 DIAGNOSIS — N402 Nodular prostate without lower urinary tract symptoms: Secondary | ICD-10-CM | POA: Diagnosis not present

## 2017-01-15 DIAGNOSIS — Z79899 Other long term (current) drug therapy: Secondary | ICD-10-CM | POA: Insufficient documentation

## 2017-01-15 DIAGNOSIS — N4289 Other specified disorders of prostate: Secondary | ICD-10-CM | POA: Diagnosis not present

## 2017-01-15 DIAGNOSIS — M542 Cervicalgia: Secondary | ICD-10-CM | POA: Diagnosis not present

## 2017-01-15 DIAGNOSIS — N411 Chronic prostatitis: Secondary | ICD-10-CM | POA: Diagnosis not present

## 2017-01-15 DIAGNOSIS — K644 Residual hemorrhoidal skin tags: Secondary | ICD-10-CM | POA: Diagnosis not present

## 2017-01-15 DIAGNOSIS — N400 Enlarged prostate without lower urinary tract symptoms: Secondary | ICD-10-CM | POA: Diagnosis not present

## 2017-01-15 HISTORY — DX: Family history of ischemic heart disease and other diseases of the circulatory system: Z82.49

## 2017-01-15 HISTORY — DX: Personal history of colonic polyps: Z86.010

## 2017-01-15 HISTORY — DX: Personal history of other specified conditions: Z87.898

## 2017-01-15 HISTORY — PX: PROSTATE BIOPSY: SHX241

## 2017-01-15 HISTORY — DX: Benign prostatic hyperplasia without lower urinary tract symptoms: N40.0

## 2017-01-15 HISTORY — DX: Other constipation: K59.09

## 2017-01-15 HISTORY — DX: Presence of spectacles and contact lenses: Z97.3

## 2017-01-15 HISTORY — DX: Elevated prostate specific antigen (PSA): R97.20

## 2017-01-15 HISTORY — DX: Malignant neoplasm of prostate: C61

## 2017-01-15 LAB — POCT HEMOGLOBIN-HEMACUE: Hemoglobin: 14.2 g/dL (ref 13.0–17.0)

## 2017-01-15 SURGERY — BIOPSY, PROSTATE, RECTAL APPROACH, WITH US GUIDANCE
Anesthesia: General

## 2017-01-15 MED ORDER — EPHEDRINE SULFATE 50 MG/ML IJ SOLN
INTRAMUSCULAR | Status: DC | PRN
  Administered 2017-01-15 (×3): 10 mg via INTRAVENOUS

## 2017-01-15 MED ORDER — LIDOCAINE HCL 2 % IJ SOLN
INTRAMUSCULAR | Status: DC | PRN
  Administered 2017-01-15: 10 mL

## 2017-01-15 MED ORDER — DEXTROSE 5 % IV SOLN
1.0000 g | INTRAVENOUS | Status: AC
  Administered 2017-01-15: 2 g via INTRAVENOUS
  Filled 2017-01-15: qty 10

## 2017-01-15 MED ORDER — FENTANYL CITRATE (PF) 100 MCG/2ML IJ SOLN
INTRAMUSCULAR | Status: DC | PRN
  Administered 2017-01-15: 100 ug via INTRAVENOUS

## 2017-01-15 MED ORDER — LIDOCAINE 2% (20 MG/ML) 5 ML SYRINGE
INTRAMUSCULAR | Status: AC
  Filled 2017-01-15: qty 5

## 2017-01-15 MED ORDER — CEFTRIAXONE SODIUM 1 G IJ SOLR
INTRAMUSCULAR | Status: AC
  Filled 2017-01-15: qty 10

## 2017-01-15 MED ORDER — TRAMADOL HCL 50 MG PO TABS: 50.0000 mg | ORAL_TABLET | Freq: Four times a day (QID) | ORAL | 0 refills | Status: DC | PRN

## 2017-01-15 MED ORDER — SCOPOLAMINE 1 MG/3DAYS TD PT72
MEDICATED_PATCH | TRANSDERMAL | Status: AC
  Filled 2017-01-15: qty 1

## 2017-01-15 MED ORDER — DEXTROSE 5 % IV SOLN
INTRAVENOUS | Status: AC
  Filled 2017-01-15: qty 50

## 2017-01-15 MED ORDER — ONDANSETRON HCL 4 MG/2ML IJ SOLN
INTRAMUSCULAR | Status: DC | PRN
  Administered 2017-01-15: 4 mg via INTRAVENOUS

## 2017-01-15 MED ORDER — ONDANSETRON HCL 4 MG/2ML IJ SOLN
INTRAMUSCULAR | Status: AC
  Filled 2017-01-15: qty 2

## 2017-01-15 MED ORDER — LIDOCAINE HCL (CARDIAC) 20 MG/ML IV SOLN
INTRAVENOUS | Status: DC | PRN
  Administered 2017-01-15: 100 mg via INTRAVENOUS

## 2017-01-15 MED ORDER — SCOPOLAMINE 1 MG/3DAYS TD PT72
1.0000 | MEDICATED_PATCH | TRANSDERMAL | Status: DC
  Administered 2017-01-15: 1.5 mg via TRANSDERMAL
  Filled 2017-01-15: qty 1

## 2017-01-15 MED ORDER — PROMETHAZINE HCL 25 MG/ML IJ SOLN
6.2500 mg | INTRAMUSCULAR | Status: DC | PRN
  Filled 2017-01-15: qty 1

## 2017-01-15 MED ORDER — KETOROLAC TROMETHAMINE 30 MG/ML IJ SOLN
INTRAMUSCULAR | Status: DC | PRN
  Administered 2017-01-15: 30 mg via INTRAVENOUS

## 2017-01-15 MED ORDER — MIDAZOLAM HCL 2 MG/2ML IJ SOLN
INTRAMUSCULAR | Status: AC
  Filled 2017-01-15: qty 4

## 2017-01-15 MED ORDER — SODIUM CHLORIDE 0.9 % IV SOLN
INTRAVENOUS | Status: DC
  Administered 2017-01-15: 06:00:00 via INTRAVENOUS
  Filled 2017-01-15: qty 1000

## 2017-01-15 MED ORDER — HYDROMORPHONE HCL 1 MG/ML IJ SOLN
0.2500 mg | INTRAMUSCULAR | Status: DC | PRN
  Filled 2017-01-15: qty 0.5

## 2017-01-15 MED ORDER — FENTANYL CITRATE (PF) 100 MCG/2ML IJ SOLN
INTRAMUSCULAR | Status: AC
  Filled 2017-01-15: qty 2

## 2017-01-15 MED ORDER — DEXAMETHASONE SODIUM PHOSPHATE 10 MG/ML IJ SOLN
INTRAMUSCULAR | Status: AC
  Filled 2017-01-15: qty 1

## 2017-01-15 MED ORDER — DEXAMETHASONE SODIUM PHOSPHATE 4 MG/ML IJ SOLN
INTRAMUSCULAR | Status: DC | PRN
  Administered 2017-01-15: 10 mg via INTRAVENOUS

## 2017-01-15 MED ORDER — MIDAZOLAM HCL 5 MG/5ML IJ SOLN
INTRAMUSCULAR | Status: DC | PRN
  Administered 2017-01-15: 2 mg via INTRAVENOUS

## 2017-01-15 MED ORDER — PROPOFOL 10 MG/ML IV BOLUS
INTRAVENOUS | Status: AC
  Filled 2017-01-15: qty 20

## 2017-01-15 MED ORDER — EPHEDRINE 5 MG/ML INJ
INTRAVENOUS | Status: AC
  Filled 2017-01-15: qty 10

## 2017-01-15 MED ORDER — FLEET ENEMA 7-19 GM/118ML RE ENEM
1.0000 | ENEMA | Freq: Once | RECTAL | Status: AC
  Administered 2017-01-15: 1 via RECTAL
  Filled 2017-01-15: qty 1

## 2017-01-15 MED ORDER — PROPOFOL 10 MG/ML IV BOLUS
INTRAVENOUS | Status: DC | PRN
  Administered 2017-01-15: 200 mg via INTRAVENOUS

## 2017-01-15 SURGICAL SUPPLY — 14 items
GLOVE BIO SURGEON STRL SZ7.5 (GLOVE) ×2 IMPLANT
INST BIOPSY MAXCORE 18GX25 (NEEDLE) IMPLANT
INSTR BIOPSY MAXCORE 18GX20 (NEEDLE) ×2 IMPLANT
KIT RM TURNOVER CYSTO AR (KITS) ×2 IMPLANT
MANIFOLD NEPTUNE II (INSTRUMENTS) IMPLANT
NDL SAFETY ECLIPSE 18X1.5 (NEEDLE) IMPLANT
NEEDLE HYPO 18GX1.5 SHARP (NEEDLE)
NEEDLE SPNL 22GX7 QUINCKE BK (NEEDLE) ×2 IMPLANT
SPONGE GAUZE 4X4 12PLY STER LF (GAUZE/BANDAGES/DRESSINGS) ×2 IMPLANT
SURGILUBE 2OZ TUBE FLIPTOP (MISCELLANEOUS) ×2 IMPLANT
SYR CONTROL 10ML LL (SYRINGE) ×2 IMPLANT
TOWEL OR 17X24 6PK STRL BLUE (TOWEL DISPOSABLE) IMPLANT
TUBE CONNECTING 12X1/4 (SUCTIONS) IMPLANT
UNDERPAD 30X30 INCONTINENT (UNDERPADS AND DIAPERS) ×2 IMPLANT

## 2017-01-15 NOTE — Op Note (Signed)
Preoperative diagnosis:  1. Prostate cancer   Postoperative diagnosis:  1. Same  Procedure: 1. Transrectal ultrasound guided prostate biopsy  Surgeon: Ardis Hughs, MD  Anesthesia: General  Complications: None  Intraoperative findings: The prostate measured 38 g, there is no median lobe.  There are no significant or apparent hypoechoic areas.  The prostate was sampled in the routine sextat template with a focus on the left apex and apical nodule as seen on his MRI in 2017.  In this area there were 4 separate specimens taken.  In total, 15 cores were obtained.  EBL: Minimal  Specimens: 15 core biopsies of the prostate, for specifically taken of the left apex.  Indication: John [name] is a 66 y.o. patient with intermediate risk prostate biopsy who did not tolerate his office biopsy well.  After reviewing the management options for treatment, he elected to proceed with the above surgical procedure(s). We have discussed the potential benefits and risks of the procedure, side effects of the proposed treatment, the likelihood of the patient achieving the goals of the procedure, and any potential problems that might occur during the procedure or recuperation. Informed consent has been obtained.  Description of procedure:  The patient was taken to the operating room and general anesthesia was induced.  The patient was then placed in the lateral decubitus position A preoperative time-out was performed.   I then placed a transrectal ultrasound into the patient's rectum and proceeded with  transrectal ultrasound of the prostate.  Images were taken in the transverse plane of the base, mid gland, and apex.  There were also obtained in the sagittal plane at the left, medial, and right.  I then injected the patient with 10 cc of injectable lidocaine, 2.5 mL's at the right base, left base, right apex, and left apex.  I then proceeded with 12 core prostate biopsy taking samples in the  right lateral base, right lateral mid, right lateral apex, right medial base, right medial mid, and right medial apex.  We then went to the left side and I performed a biopsy at the left lateral base, left lateral mid, left lateral apex, left medial base, left medial mid, and left medial apex.  At the left medial apex I obtained 3 additional cores, for a total of 4 left medial apex biopsies.  The patient tolerated the procedure well, he was subsequently transferred to the PACU in stable condition.  Ardis Hughs, M.D.

## 2017-01-15 NOTE — Anesthesia Postprocedure Evaluation (Signed)
Anesthesia Post Note  Patient: GLENMORE KARL  Procedure(s) Performed: BIOPSY TRANSRECTAL ULTRASONIC PROSTATE (TUBP) (N/A )     Patient location during evaluation: PACU Anesthesia Type: General Level of consciousness: sedated Pain management: pain level controlled Vital Signs Assessment: post-procedure vital signs reviewed and stable Respiratory status: spontaneous breathing and respiratory function stable Cardiovascular status: stable Postop Assessment: no apparent nausea or vomiting Anesthetic complications: no    Last Vitals:  Vitals:   01/15/17 0900 01/15/17 0917  BP: 124/81   Pulse: 68 68  Resp: 14 15  Temp:    SpO2: 98% 95%    Last Pain:  Vitals:   01/15/17 0531  TempSrc: Oral                 Danyael Alipio DANIEL

## 2017-01-15 NOTE — H&P (Signed)
f/u for Prostate Cancer  HPI: John [name] is a 66 year-old male established patient who is here for f/u while on Active Surveillance for Prostate Cancer .  He was diagnosed with prostate cancer in 08/23/2015. At the time of his prostate cancer diagnosis his PSA was 4.75. The patient's Gleason score at the time of diagnosis was 3+4=7. There were 1 positive cores on his initial biospy. He has had 0 biopsy(ies). The patient's most recent biopsy was 08/23/2015.   The patient has had a prostate MRI.   This was drawn on approximately 01/01/2017. His most recent PSA is 5.47. PSA History: 5/18: 4.31, 2/18: 5.5 (11% free).   He does not have problems with erections. He does not have urinary incontinence. He does not have an abnormal sensation when he needs to urinate. He does not have to strain or bear down to start his urinary stream.   He is not having pain in new locations. He does have a good appetite. His bowels are moving normally. He has not seen blood in his stool since the biopsy. He has not recently had unwanted weight loss.   MSK nomogram following surgery:  PFS - 93% at 5 yrs, 88% 10 yrs  LNI- 1%   Interval: The patient presents today for a preoperative discussion. He has had a fluctuating PSA which has remained elevated. He did not undergo a confirmation biopsy when started on active surveillance. His biopsy was poorly tolerated last time.     AUA Symptom Score: He never has the sensation of not emptying his bladder completely after finishing urinating. He never has to urinate again less that two hours after he has finished urinating. He does not have to stop and start again several times when he urinates. He never finds it difficult to postpone urination. He never has a weak urinary stream. He never has to push or strain to begin urination. He never has to get up to urinate from the time he goes to bed until the time he gets up in the morning.   Calculated AUA Symptom Score: 0    QOL  Score: He would feel pleased if he had to live with his urinary condition the way it is now for the rest of his life.   Calculated QOL Symptom Score: 1    ALLERGIES: No Allergies    MEDICATIONS: Docusate Sodium 100 mg tablet Oral  Hydrocortisone Acetate 25 mg suppository, rectal Rectal  Probiotic 250 mg capsule Oral  Sildenafil 20 mg tablet 2-5 tablets as needed prior to intimacy  Sildenafil Citrate 20 MG Oral Tablet 0 Oral  Triamcinolone Acetonide 0.1 % cream External  Vitamin D3 2,000 unit capsule Oral     GU PSH: Prostate Needle Biopsy - 08/23/2015      PSH Notes: Complete Colonoscopy, Tonsillectomy   NON-GU PSH: Diagnostic Colonoscopy - 2011 Remove Tonsils - 2011 Surgical Pathology, Gross And Microscopic Examination For Prostate Needle - 08/23/2015    GU PMH: Prostate Cancer - 06/18/2016, The patient is on active surveillance for his prostate cancer. His PSA did rise over the last 6 months which is concerning but at this point we will continue to monitor closely., - 03/16/2016, - 09/19/2015 Elevated PSA, Elevated prostate specific antigen (PSA) - 2017 BPH w/LUTS, Benign localized prostatic hyperplasia with lower urinary tract symptoms (LUTS) - 2017 ED due to arterial insufficiency, Erectile dysfunction due to arterial insufficiency - 2017 BPH w/o LUTS, BPH without urinary obstruction - 2016 Hematospermia, Hematospermia - 2016  NON-GU PMH: Encounter for general adult medical examination without abnormal findings, Encounter for preventive health examination - 2017 Personal history of other infectious and parasitic diseases, History of hepatitis - 2014    FAMILY HISTORY: Breast Cancer - Runs In Family Family Health Status Number - Runs In Family Father Deceased At Age66 ___ - Runs In Family Mother Deceased At Age 28 from diabetic complicati - Runs In Family No pertinent family history - Other   SOCIAL HISTORY: Marital Status: Married Preferred Language: English; Ethnicity:  Not Hispanic Or Latino; Race: White     Notes: Never A Smoker, Caffeine Use, Marital History - Currently Married, Alcohol Use, Tobacco Use   REVIEW OF SYSTEMS:    GU Review Male:   Patient denies frequent urination, hard to postpone urination, burning/ pain with urination, get up at night to urinate, leakage of urine, stream starts and stops, trouble starting your stream, have to strain to urinate , erection problems, and penile pain.  Gastrointestinal (Upper):   Patient denies nausea, vomiting, and indigestion/ heartburn.  Gastrointestinal (Lower):   Patient denies diarrhea and constipation.  Constitutional:   Patient denies fever, night sweats, weight loss, and fatigue.  Skin:   Patient denies skin rash/ lesion and itching.  Eyes:   Patient denies blurred vision and double vision.  Ears/ Nose/ Throat:   Patient denies sore throat and sinus problems.  Hematologic/Lymphatic:   Patient denies swollen glands and easy bruising.  Cardiovascular:   Patient denies leg swelling and chest pains.  Respiratory:   Patient denies cough and shortness of breath.  Endocrine:   Patient denies excessive thirst.  Musculoskeletal:   Patient denies back pain and joint pain.  Neurological:   Patient denies headaches and dizziness.  Psychologic:   Patient denies depression and anxiety.   VITAL SIGNS:      01/08/2017 02:05 PM  Weight 151 lb / 68.49 kg  Height 69 in / 175.26 cm  BP 113/72 mmHg  Pulse 62 /min  Temperature 97.9 F / 36.6 C  BMI 22.3 kg/m   MULTI-SYSTEM PHYSICAL EXAMINATION:    Constitutional: Well-nourished. No physical deformities. Normally developed. Good grooming.  Respiratory: No labored breathing, no use of accessory muscles. Clear to auscultation bilaterally  Cardiovascular: Normal temperature, normal extremity pulses, no swelling, no varicosities. Regular rate and rhythm  Gastrointestinal: No mass, no tenderness, no rigidity, non obese abdomen.      PAST DATA REVIEWED:  Source Of  History:  Patient  Lab Test Review:   PSA  Records Review:   Pathology Reports, Previous Doctor Records, Previous Patient Records, POC Tool   12/07/16 06/08/16 03/09/16 06/21/15 03/06/11 03/19/10  PSA  Total PSA 5.46 ng/mL 4.31 ng/dl 5.50 ng/dl 4.75  2.22  1.28   Free PSA 0.47 ng/mL 0.32 ng/dl 0.62 ng/dl 0.60     % Free PSA 9 % PSA 7 % 11 % 13       PROCEDURES:          Urinalysis Dipstick Dipstick Cont'd  Color: Yellow Bilirubin: Neg  Appearance: Clear Ketones: Neg  Specific Gravity: 1.015 Blood: Neg  pH: 7.0 Protein: Neg  Glucose: Neg Urobilinogen: 0.2    Nitrites: Neg    Leukocyte Esterase: Neg    ASSESSMENT:      ICD-10 Details  1 GU:   Prostate Cancer - C61    PLAN:           Schedule Return Visit/Planned Activity: 3 Weeks - Consult  Document Letter(s):  Created for Patient: Clinical Summary         Notes:   The patient is scheduled for a prostate biopsy in 1 week under general anesthesia. His urine is clear today. I reviewed the procedure with him again in detail. We discussed the preoperative protocol including holding his vitamins and aspirin and doing the enema. I'll plan to follow up with him in 3 weeks for a consultation after the biopsy to review the results.

## 2017-01-15 NOTE — Anesthesia Procedure Notes (Signed)
Procedure Name: LMA Insertion Date/Time: 01/15/2017 7:41 AM Performed by: Justice Rocher, CRNA Pre-anesthesia Checklist: Patient identified, Emergency Drugs available, Suction available and Patient being monitored Patient Re-evaluated:Patient Re-evaluated prior to induction Oxygen Delivery Method: Circle system utilized Preoxygenation: Pre-oxygenation with 100% oxygen Induction Type: IV induction Ventilation: Mask ventilation without difficulty LMA: LMA inserted LMA Size: 4.0 Number of attempts: 1 Airway Equipment and Method: Bite block Placement Confirmation: positive ETCO2 and breath sounds checked- equal and bilateral Tube secured with: Tape Dental Injury: Teeth and Oropharynx as per pre-operative assessment

## 2017-01-15 NOTE — Discharge Instructions (Signed)
General instructions:     Your recent prostate biopsy requires very little post hospital care but some definite precautions.   You may experience irritating effects of urine and you may expect frequency of urination and/or urgency (a stronger desire to urinate) and perhaps even getting up at night more often. This will usually resolve or improve slowly over the healing period. You may see some blood in your urine over the first 2 weeks. Do not be alarmed, even if the urine was clear for a while. Get off your feet and drink lots of fluids until clearing occurs. If you start to pass clots or don't improve call us.  Diet:  You may return to your normal diet immediately. Alcohol, spicy foods, foods high in acid and drinks with caffeine may cause irritation or frequency and should be used in moderation. To keep your urine flowing freely and avoid constipation, drink plenty of fluids during the day (8-10 glasses). Tip: Avoid cranberry juice because it is very acidic.  Activity:  Your physical activity doesn't need to be restricted. However, if you are very active, you may see some blood in the urine. We suggest that you reduce your activity under the circumstances until the bleeding has stopped.  Bowels:  It is important to keep your bowels regular during the postoperative period. Straining with bowel movements can cause bleeding. A bowel movement every other day is reasonable. Use a mild laxative if needed, such as milk of magnesia 2-3 tablespoons, or 2 Dulcolax tablets. Call if you continue to have problems. If you had been taking narcotics for pain, before, during or after your surgery, you may be constipated. Take a laxative if necessary.    Medication:  You should resume your pre-surgery medications unless told not to. In addition you may be given an antibiotic to prevent or treat infection. Antibiotics are not always necessary. All medication should be taken as prescribed until the bottles are  finished unless you are having an unusual reaction to one of the drugs.  Post Anesthesia Home Care Instructions  Activity: Get plenty of rest for the remainder of the day. A responsible individual must stay with you for 24 hours following the procedure.  For the next 24 hours, DO NOT: -Drive a car -Paediatric nurse -Drink alcoholic beverages -Take any medication unless instructed by your physician -Make any legal decisions or sign important papers.  Meals: Start with liquid foods such as gelatin or soup. Progress to regular foods as tolerated. Avoid greasy, spicy, heavy foods. If nausea and/or vomiting occur, drink only clear liquids until the nausea and/or vomiting subsides. Call your physician if vomiting continues.  Special Instructions/Symptoms: Your throat may feel dry or sore from the anesthesia or the breathing tube placed in your throat during surgery. If this causes discomfort, gargle with warm salt water. The discomfort should disappear within 24 hours.  If you had a scopolamine patch placed behind your ear for the management of post- operative nausea and/or vomiting:  1. The medication in the patch is effective for 72 hours, after which it should be removed.  Wrap patch in a tissue and discard in the trash. Wash hands thoroughly with soap and water. 2. You may remove the patch earlier than 72 hours if you experience unpleasant side effects which may include dry mouth, dizziness or visual disturbances. 3. Avoid touching the patch. Wash your hands with soap and water after contact with the patch.

## 2017-01-15 NOTE — Transfer of Care (Signed)
Immediate Anesthesia Transfer of Care Note  Patient: John [name]  Procedure(s) Performed: Procedure(s) (LRB): BIOPSY TRANSRECTAL ULTRASONIC PROSTATE (TUBP) (N/A)  Patient Location: PACU  Anesthesia Type: General  Level of Consciousness: awake, sedated, patient cooperative and responds to stimulation  Airway & Oxygen Therapy: Patient Spontanous Breathing and Patient connected to [place] oxygen  Post-op Assessment: Report given to PACU RN, Post -op Vital signs reviewed and stable and Patient moving all extremities  Post vital signs: Reviewed and stable  Complications: No apparent anesthesia complications

## 2017-01-18 ENCOUNTER — Encounter (HOSPITAL_BASED_OUTPATIENT_CLINIC_OR_DEPARTMENT_OTHER): Payer: Self-pay | Admitting: Urology

## 2017-01-21 DIAGNOSIS — C61 Malignant neoplasm of prostate: Secondary | ICD-10-CM | POA: Diagnosis not present

## 2017-03-18 ENCOUNTER — Telehealth: Payer: Self-pay | Admitting: Internal Medicine

## 2017-03-18 NOTE — Telephone Encounter (Signed)
Copied from [place] [id number]. Topic: Quick Communication - See Telephone Encounter >> Mar 18, 2017  5:03 PM Ivar Drape wrote: CRM for notification. See Telephone encounter for:  03/18/17. Patient would like a call back concerning her preventative injections.  She would like to make an appt for the ones she need.  Please call between 11-2pm.

## 2017-03-24 NOTE — Telephone Encounter (Signed)
Patient's spouse not available at time of call back. She would like call back tomorrow.  Patient is due for tetanus vaccine. His insurance will cover this vaccine in the office unless he has an injury (dog bite, rusty nail, etc), but he can get this vaccine at the pharmacy.   He is also due for Pneumovax-23.  Recommend scheduling an office visit with Dr. Raliegh Ip since he has not been seen by him since April 2017.

## 2017-03-26 NOTE — Telephone Encounter (Signed)
Spoke with patient and he stated he hasn't been seen since 2017 due to medicare not covering physicals. I informed the patient that I would return phone call after I get further information.

## 2017-03-31 NOTE — Telephone Encounter (Signed)
Spoke with patient and scheduled yearly follow-up.

## 2017-05-04 ENCOUNTER — Encounter: Payer: Self-pay | Admitting: Internal Medicine

## 2017-05-04 ENCOUNTER — Ambulatory Visit (INDEPENDENT_AMBULATORY_CARE_PROVIDER_SITE_OTHER): Payer: Medicare Other | Admitting: Internal Medicine

## 2017-05-04 VITALS — BP 90/68 | HR 61 | Temp 98.1°F | Ht 69.0 in | Wt 161.8 lb

## 2017-05-04 DIAGNOSIS — N402 Nodular prostate without lower urinary tract symptoms: Secondary | ICD-10-CM | POA: Diagnosis not present

## 2017-05-04 DIAGNOSIS — Z8249 Family history of ischemic heart disease and other diseases of the circulatory system: Secondary | ICD-10-CM | POA: Diagnosis not present

## 2017-05-04 DIAGNOSIS — Z0000 Encounter for general adult medical examination without abnormal findings: Secondary | ICD-10-CM | POA: Diagnosis not present

## 2017-05-04 DIAGNOSIS — Z23 Encounter for immunization: Secondary | ICD-10-CM | POA: Diagnosis not present

## 2017-05-04 DIAGNOSIS — R5383 Other fatigue: Secondary | ICD-10-CM | POA: Diagnosis not present

## 2017-05-04 DIAGNOSIS — R55 Syncope and collapse: Secondary | ICD-10-CM | POA: Diagnosis not present

## 2017-05-04 DIAGNOSIS — E785 Hyperlipidemia, unspecified: Secondary | ICD-10-CM | POA: Diagnosis not present

## 2017-05-04 NOTE — Patient Instructions (Signed)

## 2017-05-04 NOTE — Progress Notes (Signed)
Subjective:    Patient ID: John [name], male    DOB: 1950-04-27, 67 y.o.   MRN: [id number]  HPI  67 year old patient who is seen today for an annual assessment and subsequent Medicare wellness visit  Enjoys excellent health.  He is followed by urology with history of low-grade prostate cancer with a Gleason score of 3=3+6.  Presently, active surveillance is the plan.  Doing well without concerns or complaints  Family history both parents died at age 76.  Father had an MI at 70 and probably had aplastic anemia.  Mother died at 70 with a history of breast cancer one brother age 13 status post CABG of 72.  History of Parkinson's disease.  Past Medical History:  Diagnosis Date  . BPH with elevated PSA   . Chronic constipation   . Family history of coronary artery disease   . Hemorrhoids   . History of colon polyps 2006  . History of hepatitis A age 60  . History of syncope    per pt hx syncopy episodes throughout life--- consultation w/ dr Marlou Porch (cardiologist)--  negative stress test  (per lov note 04-19-2014  vasovagal/ situtional )  . Prostate cancer [facility]) urologist-  dr Louis Meckel   dx 08-23-2015--  Gleason 3+4, PSA 4.75-- active surveillance  . Wears glasses      Social History   Socioeconomic History  . Marital status: Married    Spouse name: Not on file  . Number of children: Not on file  . Years of education: Not on file  . Highest education level: Not on file  Occupational History  . Not on file  Social Needs  . Financial resource strain: Not on file  . Food insecurity:    Worry: Not on file    Inability: Not on file  . Transportation needs:    Medical: Not on file    Non-medical: Not on file  Tobacco Use  . Smoking status: Never Smoker  . Smokeless tobacco: Never Used  Substance and Sexual Activity  . Alcohol use: Yes    Comment: 2 drink daily average---   . Drug use: No  . Sexual activity: Not on file  Lifestyle  . Physical activity:    Days per  week: Not on file    Minutes per session: Not on file  . Stress: Not on file  Relationships  . Social connections:    Talks on phone: Not on file    Gets together: Not on file    Attends religious service: Not on file    Active member of club or organization: Not on file    Attends meetings of clubs or organizations: Not on file    Relationship status: Not on file  . Intimate partner violence:    Fear of current or ex partner: Not on file    Emotionally abused: Not on file    Physically abused: Not on file    Forced sexual activity: Not on file  Other Topics Concern  . Not on file  Social History Narrative   No caffeine     Past Surgical History:  Procedure Laterality Date  . CARDIOVASCULAR STRESS TEST  05-11-2013   dr Marlou Porch   Low risk nuclear study w/ small fixed defect in the inferolateral wall much more prominent on rest images c/w diaphragmatic attenuation, no evidence ishcemia/  normal LV function and wall motion, ef 62%  . COLONOSCOPY  last one 08-25-2011  . PROSTATE BIOPSY N/A 01/15/2017  Procedure: BIOPSY TRANSRECTAL ULTRASONIC PROSTATE (TUBP);  Surgeon: Ardis Hughs, MD;  Location[facility];  Service: Urology;  Laterality: N/A;  . TONSILLECTOMY  age 8    Family History  Problem Relation Age of Onset  . Breast cancer Mother   . Heart disease Mother        CHF  . Parkinsonism Mother   . Heart attack Father   . Colon polyps Father   . Heart disease Brother        post CABG  . Prostate cancer Maternal Aunt   . Colon cancer Neg Hx   . Esophageal cancer Neg Hx   . Stomach cancer Neg Hx     No Known Allergies  Current Outpatient Medications on File Prior to Visit  Medication Sig Dispense Refill  . Cholecalciferol (VITAMIN D3) 5000 units CAPS Take 2 capsules by mouth once a week.     . docusate sodium (COLACE) 250 MG capsule Take 1-3 capsules by mouth 2 (two) times daily. One cap. In am and 2 cap in pm    . hydrocortisone (ANUSOL-HC)  25 MG suppository PLACE 1 SUPPOSITORY (25 MG TOTAL) RECTALLY AS NEEDED FOR ITCHING. 12 suppository 0  . Probiotic Product (PROBIOTIC DAILY PO) Take 1 capsule by mouth every morning.     . sildenafil (REVATIO) 20 MG tablet Take 2-5 tablets by mouth as needed  5  . triamcinolone cream (KENALOG) 0.1 % APPLY 1 APPLICATION TOPICALLY 2 (TWO) TIMES DAILY. (Patient taking differently: APPLY 1 APPLICATION TOPICALLY AS NEEDED) 80 g 0   No current facility-administered medications on file prior to visit.     BP 90/68 (BP Location: Right Arm, Patient Position: Sitting, Cuff Size: Normal)   Pulse 61   Temp 98.1 F (36.7 C) (Oral)   Ht 5\' 9"  (1.753 m)   Wt 161 lb 12.8 oz (73.4 kg)   SpO2 97%   BMI 23.89 kg/m   Subsequent Medicare wellness visit  1. Risk factors, based on past  M,S,F history.  No significant cardiovascular risk factors.  Number of family members have had cardiac events at age 51.  Patient underwent cardiac evaluation at that age that included nuclear stress testing  2.  Physical activities: Remains quite active without exercise limitations  3.  Depression/mood: No history of depression or mood disorder  4.  Hearing: No deficits  5.  ADL's: Independent  6.  Fall risk: Low  7.  Home safety: No problems identified 8.  Height weight, and visual acuity;  height and weight stable no change in visual acuity missed his annual eye examination last year 9.  Counseling: Heart healthy diet encouraged regular exercise recommended  10. Lab orders based on risk factors: We will check laboratory update including lipid profile  11. Referral : Follow-up urology as planned  12. Care plan: Continue efforts at aggressive risk factor modification  13. Cognitive assessment: Alert and oriented with normal affect.  No cognitive dysfunction  14. Screening: Patient provided with a written and personalized 5-10 year screening schedule in the AVS. follow-up colonoscopy 2023  15. Provider List  Update: Urology primary care ophthalmology and GI    Review of Systems  Constitutional: Negative for appetite change, chills, fatigue and fever.  HENT: Negative for congestion, dental problem, ear pain, hearing loss, sore throat, tinnitus, trouble swallowing and voice change.   Eyes: Negative for pain, discharge and visual disturbance.  Respiratory: Negative for cough, chest tightness, wheezing and stridor.   Cardiovascular: Negative  for chest pain, palpitations and leg swelling.  Gastrointestinal: Negative for abdominal distention, abdominal pain, blood in stool, constipation, diarrhea, nausea and vomiting.  Genitourinary: Negative for difficulty urinating, discharge, flank pain, genital sores, hematuria and urgency.  Musculoskeletal: Negative for arthralgias, back pain, gait problem, joint swelling, myalgias and neck stiffness.  Skin: Negative for rash.  Neurological: Negative for dizziness, syncope, speech difficulty, weakness, numbness and headaches.  Hematological: Negative for adenopathy. Does not bruise/bleed easily.  Psychiatric/Behavioral: Negative for behavioral problems and dysphoric mood. The patient is not nervous/anxious.        Objective:   Physical Exam  Constitutional: He appears well-developed and well-nourished.  HENT:  Head: Normocephalic and atraumatic.  Right Ear: External ear normal.  Left Ear: External ear normal.  Nose: Nose normal.  Mouth/Throat: Oropharynx is clear and moist.  Eyes: Pupils are equal, round, and reactive to light. Conjunctivae and EOM are normal. No scleral icterus.  Neck: Normal range of motion. Neck supple. No JVD present. No thyromegaly present.  Cardiovascular: Regular rhythm, normal heart sounds and intact distal pulses. Exam reveals no gallop and no friction rub.  No murmur heard. Pulmonary/Chest: Effort normal and breath sounds normal. He exhibits no tenderness.  Abdominal: Soft. Bowel sounds are normal. He exhibits no distension and  no mass. There is no tenderness.  Genitourinary: Penis normal.  Musculoskeletal: Normal range of motion. He exhibits no edema or tenderness.  Lymphadenopathy:    He has no cervical adenopathy.  Neurological: He is alert. He has normal reflexes. No cranial nerve deficit. Coordination normal.  Skin: Skin is warm and dry. No rash noted.  Psychiatric: He has a normal mood and affect. His behavior is normal.          Assessment & Plan:   History of prostate cancer.  Follow-up urology Subsequent Medicare wellness visit History of vasovagal syncope  We will check laboratory update Continue heart healthy diet and active lifestyle Tdap booster Follow-up 1 year  Nyoka Cowden

## 2017-05-05 LAB — CBC WITH DIFFERENTIAL/PLATELET
BASOS ABS: 0.1 10*3/uL (ref 0.0–0.1)
Basophils Relative: 1.1 % (ref 0.0–3.0)
EOS PCT: 0.9 % (ref 0.0–5.0)
Eosinophils Absolute: 0 10*3/uL (ref 0.0–0.7)
HCT: 41.4 % (ref 39.0–52.0)
Hemoglobin: 14 g/dL (ref 13.0–17.0)
LYMPHS ABS: 1.7 10*3/uL (ref 0.7–4.0)
Lymphocytes Relative: 36.6 % (ref 12.0–46.0)
MCHC: 33.8 g/dL (ref 30.0–36.0)
MCV: 92.5 fl (ref 78.0–100.0)
MONO ABS: 0.4 10*3/uL (ref 0.1–1.0)
MONOS PCT: 8.7 % (ref 3.0–12.0)
NEUTROS PCT: 52.7 % (ref 43.0–77.0)
Neutro Abs: 2.4 10*3/uL (ref 1.4–7.7)
Platelets: 186 10*3/uL (ref 150.0–400.0)
RBC: 4.47 Mil/uL (ref 4.22–5.81)
RDW: 13.5 % (ref 11.5–15.5)
WBC: 4.5 10*3/uL (ref 4.0–10.5)

## 2017-05-05 LAB — LIPID PANEL
CHOLESTEROL: 175 mg/dL (ref 0–200)
HDL: 53 mg/dL (ref >39.00)
LDL Cholesterol: 104 mg/dL — ABNORMAL HIGH (ref 0–99)
NONHDL: 122.06
TRIGLYCERIDES: 92 mg/dL (ref 0.0–149.0)
Total CHOL/HDL Ratio: 3
VLDL: 18.4 mg/dL (ref 0.0–40.0)

## 2017-05-05 LAB — COMPREHENSIVE METABOLIC PANEL
ALBUMIN: 4.3 g/dL (ref 3.5–5.2)
ALK PHOS: 65 U/L (ref 39–117)
ALT: 12 U/L (ref 0–53)
AST: 14 U/L (ref 0–37)
BILIRUBIN TOTAL: 0.5 mg/dL (ref 0.2–1.2)
BUN: 14 mg/dL (ref 6–23)
CO2: 30 mEq/L (ref 19–32)
Calcium: 9.4 mg/dL (ref 8.4–10.5)
Chloride: 104 mEq/L (ref 96–112)
Creatinine, Ser: 0.76 mg/dL (ref 0.40–1.50)
GFR: 108.04 mL/min (ref >60.00)
GLUCOSE: 88 mg/dL (ref 70–99)
POTASSIUM: 4.5 meq/L (ref 3.5–5.1)
Sodium: 139 mEq/L (ref 135–145)
TOTAL PROTEIN: 6.2 g/dL (ref 6.0–8.3)

## 2017-05-05 LAB — HEPATITIS C ANTIBODY
Hepatitis C Ab: NONREACTIVE
SIGNAL TO CUT-OFF: 0.01 (ref <1.00)

## 2017-05-05 LAB — TSH: TSH: 1.64 u[IU]/mL (ref 0.35–4.50)

## 2017-07-19 DIAGNOSIS — C61 Malignant neoplasm of prostate: Secondary | ICD-10-CM | POA: Diagnosis not present

## 2017-07-26 DIAGNOSIS — C61 Malignant neoplasm of prostate: Secondary | ICD-10-CM | POA: Diagnosis not present

## 2017-07-26 DIAGNOSIS — N5201 Erectile dysfunction due to arterial insufficiency: Secondary | ICD-10-CM | POA: Diagnosis not present

## 2017-08-04 DIAGNOSIS — H52223 Regular astigmatism, bilateral: Secondary | ICD-10-CM | POA: Diagnosis not present

## 2017-08-04 DIAGNOSIS — H524 Presbyopia: Secondary | ICD-10-CM | POA: Diagnosis not present

## 2017-08-04 DIAGNOSIS — H11153 Pinguecula, bilateral: Secondary | ICD-10-CM | POA: Diagnosis not present

## 2017-08-04 DIAGNOSIS — H5203 Hypermetropia, bilateral: Secondary | ICD-10-CM | POA: Diagnosis not present

## 2017-08-04 DIAGNOSIS — H43813 Vitreous degeneration, bilateral: Secondary | ICD-10-CM | POA: Diagnosis not present

## 2017-08-04 DIAGNOSIS — H25813 Combined forms of age-related cataract, bilateral: Secondary | ICD-10-CM | POA: Diagnosis not present

## 2017-09-09 ENCOUNTER — Telehealth: Payer: Self-pay

## 2017-09-09 ENCOUNTER — Ambulatory Visit (INDEPENDENT_AMBULATORY_CARE_PROVIDER_SITE_OTHER): Payer: Medicare Other

## 2017-09-09 VITALS — BP 96/64 | HR 65 | Ht 69.0 in | Wt 158.0 lb

## 2017-09-09 DIAGNOSIS — Z0000 Encounter for general adult medical examination without abnormal findings: Secondary | ICD-10-CM | POA: Diagnosis not present

## 2017-09-09 NOTE — Telephone Encounter (Signed)
Dr. Raliegh Ip John [name] came in for AWV. He had one during his April visit with you, but not sure if it was paid because he had one in August of 2018.  He does have Medicare + [employer] and requested to take the Shingrix in the office as he does not have a Part D plan as such.  I will fup with Jaci Standard and will outreach him for a nurse visit to take the shingrix if appropriate.   FYI only or advise as appropriate   Tks

## 2017-09-09 NOTE — Progress Notes (Signed)
Subjective:   John [name] is a 67 y.o. male who presents for Medicare Annual/Subsequent preventive examination.  Reports health as good but tired Travels in the fall with spouse Leaves 4 to 5 weeks at a time  [place] to [place]  [place]  Wedding in [place];  May go to [place]  Eats lots of vegetables Good protein Not a lot of red meats Grains  Beans are a source of protein for him  Exercise Physical work around the yard  Late 90's had work time to use for gym Combination of weights and cardio  Benefits of exercise; Helps maintain; body mass, helps bone density Maintains cardiac' mobility  Has to be easily accessible  Used to love paddling  [place]  T[place] 40 minutes - lives [place]; [place]  Did walk a lot   States he gets plenty of sleep  Sleeps from 2am and wakes up 10am  9 hours seems to work for am  Stayed late at work;  In the summer works in the evening when it is not has hot  Starts at [employer] better during the day when he gets up at [employer], father and nephew all had serious heart events    Health Maintenance Due  Topic Date Due  . PNA vac Low Risk Adult (2 of 2 - PPSV23) 10/12/2016  . INFLUENZA VACCINE  09/02/2017   PSA slightly elevated 4.31 06/2016  Colonoscopy 08/2011 and due 08/2021   Cardiac Risk Factors include: advanced age (>77men, >20 women);family history of premature cardiovascular disease;male gender     Objective:    Vitals: BP 96/64   Pulse 65   Ht 5\' 9"  (1.753 m)   Wt 158 lb (71.7 kg)   SpO2 98%   BMI 23.33 kg/m   Body mass index is 23.33 kg/m.  Advanced Directives 09/09/2017 01/15/2017 09/08/2016  Does Patient Have a Medical Advance Directive? No No No  Would patient like information on creating a medical advance directive? - No - Patient declined -    Tobacco Social History   Tobacco Use  Smoking Status Never Smoker  Smokeless Tobacco Never Used       Counseling given: Yes   Clinical Intake:     Past Medical History:  Diagnosis Date  . BPH with elevated PSA   . Chronic constipation   . Family history of coronary artery disease   . Hemorrhoids   . History of colon polyps 2006  . History of hepatitis A age 65  . History of syncope    per pt hx syncopy episodes throughout life--- consultation w/ dr Marlou Porch (cardiologist)--  negative stress test  (per lov note 04-19-2014  vasovagal/ situtional )  . Prostate cancer [facility]) urologist-  dr Louis Meckel   dx 08-23-2015--  Gleason 3+4, PSA 4.75-- active surveillance  . Wears glasses    Past Surgical History:  Procedure Laterality Date  . CARDIOVASCULAR STRESS TEST  05-11-2013   dr Marlou Porch   Low risk nuclear study w/ small fixed defect in the inferolateral wall much more prominent on rest images c/w diaphragmatic attenuation, no evidence ishcemia/  normal LV function and wall motion, ef 62%  . COLONOSCOPY  last one 08-25-2011  . PROSTATE BIOPSY N/A 01/15/2017   Procedure: BIOPSY TRANSRECTAL ULTRASONIC PROSTATE (TUBP);  Surgeon: Ardis Hughs, MD;  Location: [facility];  Service: Urology;  Laterality: N/A;  . TONSILLECTOMY  age 46   Family History  Problem Relation Age of Onset  . Breast cancer Mother   . Heart disease Mother        CHF  . Parkinsonism Mother   . Heart attack Father   . Colon polyps Father   . Heart disease Brother        post CABG  . Prostate cancer Maternal Aunt   . Colon cancer Neg Hx   . Esophageal cancer Neg Hx   . Stomach cancer Neg Hx    Social History   Socioeconomic History  . Marital status: Married    Spouse name: Not on file  . Number of children: Not on file  . Years of education: Not on file  . Highest education level: Not on file  Occupational History  . Not on file  Social Needs  . Financial resource strain: Not on file  . Food insecurity:    Worry: Not on file    Inability: Not on file  . Transportation needs:     Medical: Not on file    Non-medical: Not on file  Tobacco Use  . Smoking status: Never Smoker  . Smokeless tobacco: Never Used  Substance and Sexual Activity  . Alcohol use: Yes    Comment: 2 drink daily average---   . Drug use: No  . Sexual activity: Not on file  Lifestyle  . Physical activity:    Days per week: Not on file    Minutes per session: Not on file  . Stress: Not on file  Relationships  . Social connections:    Talks on phone: Not on file    Gets together: Not on file    Attends religious service: Not on file    Active member of club or organization: Not on file    Attends meetings of clubs or organizations: Not on file    Relationship status: Not on file  Other Topics Concern  . Not on file  Social History Narrative   No caffeine     Outpatient Encounter Medications as of 09/09/2017  Medication Sig  . Cholecalciferol (VITAMIN D3) 5000 units CAPS Take 2 capsules by mouth once a week.   . docusate sodium (COLACE) 250 MG capsule Take 1-3 capsules by mouth 2 (two) times daily. One cap. In am and 2 cap in pm  . hydrocortisone (ANUSOL-HC) 25 MG suppository PLACE 1 SUPPOSITORY (25 MG TOTAL) RECTALLY AS NEEDED FOR ITCHING.  . Probiotic Product (PROBIOTIC DAILY PO) Take 1 capsule by mouth every morning.   . sildenafil (REVATIO) 20 MG tablet Take 2-5 tablets by mouth as needed  . triamcinolone cream (KENALOG) 0.1 % APPLY 1 APPLICATION TOPICALLY 2 (TWO) TIMES DAILY. (Patient taking differently: APPLY 1 APPLICATION TOPICALLY AS NEEDED)   No facility-administered encounter medications on file as of 09/09/2017.     Activities of Daily Living In your present state of health, do you have any difficulty performing the following activities: 09/09/2017 01/15/2017  Hearing? N N  Vision? N N  Difficulty concentrating or making decisions? N N  Comment still playing music;  -  Walking or climbing stairs? N N  Dressing or bathing? N N  Doing errands, shopping? N -  Preparing Food and  eating ? N -  Using the Toilet? N -  In the past six months, have you accidently leaked urine? N -  Do you have problems with loss of bowel control? N -  Managing your Medications? N -  Managing your Finances? N -  Housekeeping or managing your Housekeeping? N -  Some recent data might be hidden    Patient Care Team: Marletta Lor, MD as PCP - General   Assessment:   This is a routine wellness examination for [facility].  Exercise Activities and Dietary recommendations Current Exercise Habits: Home exercise routine, Time (Minutes): 60, Frequency (Times/Week): 4, Weekly Exercise (Minutes/Week): 240  Goals    . patient stated     Be more energetic        Fall Risk Fall Risk  09/09/2017 09/08/2016 05/13/2015 12/20/2013 11/08/2012  Falls in the past year? No No No No No     Depression Screen PHQ 2/9 Scores 09/09/2017 09/08/2016 05/13/2015 12/20/2013  PHQ - 2 Score 0 0 0 0    Cognitive Function MMSE - Mini Mental State Exam 09/08/2016  Not completed: (No Data)     Ad8 score reviewed for issues:  Issues making decisions:  Less interest in hobbies / activities:  Repeats questions, stories (family complaining):  Trouble using ordinary gadgets (microwave, computer, phone):  Forgets the month or year:   Mismanaging finances:   Remembering appts:  Daily problems with thinking and/or memory: Ad8 score is=0        Immunization History  Administered Date(s) Administered  . Influenza Split 01/02/2011, 10/13/2011  . Influenza Whole 10/21/2007, 01/20/2010  . Influenza, High Dose Seasonal PF 11/28/2014, 10/25/2016  . Influenza,inj,Quad PF,6+ Mos 11/08/2012, 12/20/2013  . Influenza-Unspecified 10/25/2016  . Pneumococcal Conjugate-13 12/20/2013  . Pneumococcal Polysaccharide-23 03/05/1996, 10/13/2011  . Td 05/04/2006  . Tdap 05/04/2017  . Zoster 09/09/2012      Screening Tests Health Maintenance  Topic Date Due  . PNA vac Low Risk Adult (2 of 2 - PPSV23)  10/12/2016  . INFLUENZA VACCINE  09/02/2017  . COLONOSCOPY  08/24/2021  . TETANUS/TDAP  05/05/2027  . Hepatitis C Screening  Completed       Plan:      PCP Notes   Health Maintenance PSA slightly elevated 4.31 06/2016  Low grade prostate cancer under surveillance  Colonoscopy 08/2011 and due 08/2021  Has Federal BCBS plan + medicare; checking on shingrix series in the office. Will fup with him.  Declines PSV 23 today as he was told he didn't need another. Discussed guideline and he will discuss next visit.    Abnormal Screens  BP low; 96/64 Irregular sleep patterns., c/o of some fatigue with daytime sleepiness at times.  STated Dr. Raliegh Ip told him to check his BP at home and he did not. States he will check his BP now and keep a record, as well as record his sleep hx and note daytime drowsiness.  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   Nurse Concerns; As noted  Next PCP apt [facility];       I have personally reviewed and noted the following in the patient's chart:   . Medical and social history . Use of alcohol, tobacco or illicit drugs  . Current medications and supplements . Functional ability and status . Nutritional status . Physical activity . Advanced directives . List of other physicians . Hospitalizations, surgeries, and ER visits in previous 12 months . Vitals . Screenings to include cognitive, depression, and falls . Referrals and appointments  In addition, I have reviewed and discussed with patient certain preventive protocols, quality metrics, and best practice recommendations. A written personalized care plan for preventive services  as well as general preventive health recommendations were provided to patient.     Wynetta Fines, RN  09/09/2017

## 2017-09-09 NOTE — Patient Instructions (Addendum)
John [name] , Thank you for taking time to come for your Medicare Wellness Visit. I appreciate your ongoing commitment to your health goals. Please review the following plan we discussed and let me know if I can assist you in the future.   The Centers for Disease Control are now recommending 2 pneumonia vaccinations after 59. The first is the Prevnar 13. This helps to boost your immunity to community acquired pneumonia as well as some protection from bacterial pneumonia  The 2nd is the pneumovax 23, which offers more broad protection!  Please consider taking these as this is your best protection against pneumonia.  YOU HAD psv 23 10/13/2011 PREVNAR 12/20/2013 YOU HAVE TO WAIT 5 YEARS TO TAKE PSV 23 AND THAT WOULD HAVE BEEN DUE 10/2016   Shingrix is a vaccine for the prevention of Shingles in Adults 50 and older.  If you are on Medicare, the shingrix is covered under your Part D plan, so you will take both of the vaccines in the series at your pharmacy. Please check with your benefits regarding applicable copays or out of pocket expenses.  The Shingrix is given in 2 vaccines approx 8 weeks apart. You must receive the 2nd dose prior to 6 months from receipt of the first. Please have the pharmacist print out you Immunization  dates for our office records     These are the goals we discussed: Goals    . patient stated     Be more energetic        This is a list of the screening recommended for you and due dates:  Health Maintenance  Topic Date Due  . Pneumonia vaccines (2 of 2 - PPSV23) 10/12/2016  . Flu Shot  09/02/2017  . Colon Cancer Screening  08/24/2021  . Tetanus Vaccine  05/05/2027  .  Hepatitis C: One time screening is recommended by Center for Disease Control  (CDC) for  adults born from [birth year] through 1965.   Completed      Fall Prevention in the Home Falls can cause injuries. They can happen to people of all ages. There are many things you can do to make your home safe and  to help prevent falls. What can I do on the outside of my home?  Regularly fix the edges of walkways and driveways and fix any cracks.  Remove anything that might make you trip as you walk through a door, such as a raised step or threshold.  Trim any bushes or trees on the path to your home.  Use bright outdoor lighting.  Clear any walking paths of anything that might make someone trip, such as rocks or tools.  Regularly check to see if handrails are loose or broken. Make sure that both sides of any steps have handrails.  Any raised decks and porches should have guardrails on the edges.  Have any leaves, snow, or ice cleared regularly.  Use sand or salt on walking paths during winter.  Clean up any spills in your garage right away. This includes oil or grease spills. What can I do in the bathroom?  Use night lights.  Install grab bars by the toilet and in the tub and shower. Do not use towel bars as grab bars.  Use non-skid mats or decals in the tub or shower.  If you need to sit down in the shower, use a plastic, non-slip stool.  Keep the floor dry. Clean up any water that spills on the floor as soon as  it happens.  Remove soap buildup in the tub or shower regularly.  Attach bath mats securely with double-sided non-slip rug tape.  Do not have throw rugs and other things on the floor that can make you trip. What can I do in the bedroom?  Use night lights.  Make sure that you have a light by your bed that is easy to reach.  Do not use any sheets or blankets that are too big for your bed. They should not hang down onto the floor.  Have a firm chair that has side arms. You can use this for support while you get dressed.  Do not have throw rugs and other things on the floor that can make you trip. What can I do in the kitchen?  Clean up any spills right away.  Avoid walking on wet floors.  Keep items that you use a lot in easy-to-reach places.  If you need to  reach something above you, use a strong step stool that has a grab bar.  Keep electrical cords out of the way.  Do not use floor polish or wax that makes floors slippery. If you must use wax, use non-skid floor wax.  Do not have throw rugs and other things on the floor that can make you trip. What can I do with my stairs?  Do not leave any items on the stairs.  Make sure that there are handrails on both sides of the stairs and use them. Fix handrails that are broken or loose. Make sure that handrails are as long as the stairways.  Check any carpeting to make sure that it is firmly attached to the stairs. Fix any carpet that is loose or worn.  Avoid having throw rugs at the top or bottom of the stairs. If you do have throw rugs, attach them to the floor with carpet tape.  Make sure that you have a light switch at the top of the stairs and the bottom of the stairs. If you do not have them, ask someone to add them for you. What else can I do to help prevent falls?  Wear shoes that: ? Do not have high heels. ? Have rubber bottoms. ? Are comfortable and fit you well. ? Are closed at the toe. Do not wear sandals.  If you use a stepladder: ? Make sure that it is fully opened. Do not climb a closed stepladder. ? Make sure that both sides of the stepladder are locked into place. ? Ask someone to hold it for you, if possible.  Clearly mark and make sure that you can see: ? Any grab bars or handrails. ? First and last steps. ? Where the edge of each step is.  Use tools that help you move around (mobility aids) if they are needed. These include: ? Canes. ? Walkers. ? Scooters. ? Crutches.  Turn on the lights when you go into a dark area. Replace any light bulbs as soon as they burn out.  Set up your furniture so you have a clear path. Avoid moving your furniture around.  If any of your floors are uneven, fix them.  If there are any pets around you, be aware of where they  are.  Review your medicines with your doctor. Some medicines can make you feel dizzy. This can increase your chance of falling. Ask your doctor what other things that you can do to help prevent falls. This information is not intended to replace advice given to you  by your health care provider. Make sure you discuss any questions you have with your health care provider. Document Released: 11/15/2008 Document Revised: 06/27/2015 Document Reviewed: 02/23/2014 Elsevier Interactive Patient Education  2018 [place] Maintenance, Male A healthy lifestyle and preventive care is important for your health and wellness. Ask your health care provider about what schedule of regular examinations is right for you. What should I know about weight and diet? Eat a Healthy Diet  Eat plenty of vegetables, fruits, whole grains, low-fat dairy products, and lean protein.  Do not eat a lot of foods high in solid fats, added sugars, or salt.  Maintain a Healthy Weight Regular exercise can help you achieve or maintain a healthy weight. You should:  Do at least 150 minutes of exercise each week. The exercise should increase your heart rate and make you sweat (moderate-intensity exercise).  Do strength-training exercises at least twice a week.  Watch Your Levels of Cholesterol and Blood Lipids  Have your blood tested for lipids and cholesterol every 5 years starting at 69 years of age. If you are at high risk for heart disease, you should start having your blood tested when you are 69 years old. You may need to have your cholesterol levels checked more often if: ? Your lipid or cholesterol levels are high. ? You are older than 69 years of age. ? You are at high risk for heart disease.  What should I know about cancer screening? Many types of cancers can be detected early and may often be prevented. Lung Cancer  You should be screened every year for lung cancer if: ? You are a current smoker  who has smoked for at least 30 years. ? You are a former smoker who has quit within the past 15 years.  Talk to your health care provider about your screening options, when you should start screening, and how often you should be screened.  Colorectal Cancer  Routine colorectal cancer screening usually begins at 69 years of age and should be repeated every 5-10 years until you are 69 years old. You may need to be screened more often if early forms of precancerous polyps or small growths are found. Your health care provider may recommend screening at an earlier age if you have risk factors for colon cancer.  Your health care provider may recommend using home test kits to check for hidden blood in the stool.  A small camera at the end of a tube can be used to examine your colon (sigmoidoscopy or colonoscopy). This checks for the earliest forms of colorectal cancer.  Prostate and Testicular Cancer  Depending on your age and overall health, your health care provider may do certain tests to screen for prostate and testicular cancer.  Talk to your health care provider about any symptoms or concerns you have about testicular or prostate cancer.  Skin Cancer  Check your skin from head to toe regularly.  Tell your health care provider about any new moles or changes in moles, especially if: ? There is a change in a mole's size, shape, or color. ? You have a mole that is larger than a pencil eraser.  Always use sunscreen. Apply sunscreen liberally and repeat throughout the day.  Protect yourself by wearing long sleeves, pants, a wide-brimmed hat, and sunglasses when outside.  What should I know about heart disease, diabetes, and high blood pressure?  If you are 63-61 years of age, have your blood pressure checked every  3-5 years. If you are 60 years of age or older, have your blood pressure checked every year. You should have your blood pressure measured twice-once when you are at a hospital or  clinic, and once when you are not at a hospital or clinic. Record the average of the two measurements. To check your blood pressure when you are not at a hospital or clinic, you can use: ? An automated blood pressure machine at a pharmacy. ? A home blood pressure monitor.  Talk to your health care provider about your target blood pressure.  If you are between 35-3 years old, ask your health care provider if you should take aspirin to prevent heart disease.  Have regular diabetes screenings by checking your fasting blood sugar level. ? If you are at a normal weight and have a low risk for diabetes, have this test once every three years after the age of 16. ? If you are overweight and have a high risk for diabetes, consider being tested at a younger age or more often.  A one-time screening for abdominal aortic aneurysm (AAA) by ultrasound is recommended for men aged 80-75 years who are current or former smokers. What should I know about preventing infection? Hepatitis B If you have a higher risk for hepatitis B, you should be screened for this virus. Talk with your health care provider to find out if you are at risk for hepatitis B infection. Hepatitis C Blood testing is recommended for:  Everyone born from [birth year] through 1967.  Anyone with known risk factors for hepatitis C.  Sexually Transmitted Diseases (STDs)  You should be screened each year for STDs including gonorrhea and chlamydia if: ? You are sexually active and are younger than 69 years of age. ? You are older than 69 years of age and your health care provider tells you that you are at risk for this type of infection. ? Your sexual activity has changed since you were last screened and you are at an increased risk for chlamydia or gonorrhea. Ask your health care provider if you are at risk.  Talk with your health care provider about whether you are at high risk of being infected with HIV. Your health care provider may recommend a  prescription medicine to help prevent HIV infection.  What else can I do?  Schedule regular health, dental, and eye exams.  Stay current with your vaccines (immunizations).  Do not use any tobacco products, such as cigarettes, chewing tobacco, and e-cigarettes. If you need help quitting, ask your health care provider.  Limit alcohol intake to no more than 2 drinks per day. One drink equals 12 ounces of beer, 5 ounces of wine, or 1 ounces of hard liquor.  Do not use street drugs.  Do not share needles.  Ask your health care provider for help if you need support or information about quitting drugs.  Tell your health care provider if you often feel depressed.  Tell your health care provider if you have ever been abused or do not feel safe at home. This information is not intended to replace advice given to you by your health care provider. Make sure you discuss any questions you have with your health care provider. Document Released: 07/18/2007 Document Revised: 09/18/2015 Document Reviewed: 10/23/2014 Elsevier Interactive Patient Education  [employer].

## 2017-09-09 NOTE — Progress Notes (Signed)
Hannah R Kim, DO  

## 2017-09-23 NOTE — Telephone Encounter (Signed)
Call placed to Mr. Wehrli, He can take his shingrix at the office but may have to complete ABN as [employer] may be individualized and may pay or may not since this is a part d benefit.  Left my number for questions [place]   Per [employer] Wynetta Fines RN

## 2018-01-17 ENCOUNTER — Ambulatory Visit (INDEPENDENT_AMBULATORY_CARE_PROVIDER_SITE_OTHER): Payer: Medicare Other

## 2018-01-17 DIAGNOSIS — Z23 Encounter for immunization: Secondary | ICD-10-CM | POA: Diagnosis not present

## 2018-01-17 DIAGNOSIS — C61 Malignant neoplasm of prostate: Secondary | ICD-10-CM | POA: Diagnosis not present

## 2018-02-08 DIAGNOSIS — N5201 Erectile dysfunction due to arterial insufficiency: Secondary | ICD-10-CM | POA: Diagnosis not present

## 2018-02-08 DIAGNOSIS — C61 Malignant neoplasm of prostate: Secondary | ICD-10-CM | POA: Diagnosis not present

## 2018-02-09 ENCOUNTER — Ambulatory Visit (INDEPENDENT_AMBULATORY_CARE_PROVIDER_SITE_OTHER): Payer: Medicare Other | Admitting: Internal Medicine

## 2018-02-09 ENCOUNTER — Encounter: Payer: Self-pay | Admitting: Internal Medicine

## 2018-02-09 VITALS — BP 102/64 | HR 68 | Temp 97.8°F | Ht 69.0 in | Wt 166.3 lb

## 2018-02-09 DIAGNOSIS — C61 Malignant neoplasm of prostate: Secondary | ICD-10-CM | POA: Diagnosis not present

## 2018-02-09 DIAGNOSIS — Z23 Encounter for immunization: Secondary | ICD-10-CM

## 2018-02-09 NOTE — Patient Instructions (Signed)
-  It was nice meeting you today!  -1/2 shingles vaccines today.  -Schedule follow up after April 2020 for your annual wellness visit.

## 2018-02-09 NOTE — Progress Notes (Signed)
Established Patient Office Visit     CC/Reason for Visit: Establish care, follow-up on chronic conditions  HPI: John [name] is a 68 y.o. male who is coming in today for the above mentioned reasons.Due for annual physical exam in April 2020. 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 Louis Meckel, yesterday and he was noted to have a significant increase in his PSA from 6.6-7.9.  Patient's understanding of their discussion is that over the summer they will repeat the PSA and if the uptrend continues then the plan at that point will be for repeat biopsy.  In the meantime he remains on active surveillance.  He also has some erectile dysfunction and uses sildenafil as needed.  Other than this he has no medical history of significance.  He is requesting shingles immunization series today.   Past Medical/Surgical History: Past Medical History:  Diagnosis Date  . BPH with elevated PSA   . Chronic constipation   . Family history of coronary artery disease   . Hemorrhoids   . History of colon polyps 2006  . History of hepatitis A age 53  . History of syncope    per pt hx syncopy episodes throughout life--- consultation w/ dr Marlou Porch (cardiologist)--  negative stress test  (per lov note 04-19-2014  vasovagal/ situtional )  . Prostate cancer [facility]) urologist-  dr Louis Meckel   dx 08-23-2015--  Gleason 3+4, PSA 4.75-- active surveillance  . Wears glasses     Past Surgical History:  Procedure Laterality Date  . CARDIOVASCULAR STRESS TEST  05-11-2013   dr Marlou Porch   Low risk nuclear study w/ small fixed defect in the inferolateral wall much more prominent on rest images c/w diaphragmatic attenuation, no evidence ishcemia/  normal LV function and wall motion, ef 62%  . COLONOSCOPY  last one 08-25-2011  . PROSTATE BIOPSY N/A 01/15/2017   Procedure: BIOPSY TRANSRECTAL ULTRASONIC  PROSTATE (TUBP);  Surgeon: Ardis Hughs, MD;  Location: [facility];  Service: Urology;  Laterality: N/A;  . TONSILLECTOMY  age 16    Social History:  reports that he has never smoked. He has never used smokeless tobacco. He reports current alcohol use. He reports that he does not use drugs.  Allergies: No Known Allergies  Family History:  Family History  Problem Relation Age of Onset  . Breast cancer Mother   . Heart disease Mother        CHF  . Parkinsonism Mother   . Heart attack Father   . Colon polyps Father   . Heart disease Brother        post CABG  . Prostate cancer Maternal Aunt   . Colon cancer Neg Hx   . Esophageal cancer Neg Hx   . Stomach cancer Neg Hx      Current Outpatient Medications:  .  Cholecalciferol (VITAMIN D3) 5000 units CAPS, Take 2 capsules by mouth once a week. , Disp: , Rfl:  .  docusate sodium (COLACE) 250 MG capsule, Take 1-3 capsules by mouth 2 (two) times daily. One cap. In am and 2 cap in pm, Disp: , Rfl:  .  hydrocortisone (ANUSOL-HC) 25 MG suppository, PLACE 1 SUPPOSITORY (25 MG TOTAL) RECTALLY AS NEEDED FOR ITCHING., Disp: 12 suppository, Rfl: 0 .  Probiotic Product (PROBIOTIC DAILY PO), Take 1 capsule by mouth every morning. , Disp: ,  Rfl:  .  sildenafil (REVATIO) 20 MG tablet, Take 2-5 tablets by mouth as needed, Disp: , Rfl: 5 .  triamcinolone cream (KENALOG) 0.1 %, APPLY 1 APPLICATION TOPICALLY 2 (TWO) TIMES DAILY. (Patient taking differently: APPLY 1 APPLICATION TOPICALLY AS NEEDED), Disp: 80 g, Rfl: 0  Review of Systems:  Constitutional: Denies fever, chills, diaphoresis, appetite change and fatigue.  HEENT: Denies photophobia, eye pain, redness, hearing loss, ear pain, congestion, sore throat, rhinorrhea, sneezing, mouth sores, trouble swallowing, neck pain, neck stiffness and tinnitus.   Respiratory: Denies SOB, DOE, cough, chest tightness,  and wheezing.   Cardiovascular: Denies chest pain, palpitations and leg  swelling.  Gastrointestinal: Denies nausea, vomiting, abdominal pain, diarrhea, constipation, blood in stool and abdominal distention.  Genitourinary: Denies dysuria, urgency, frequency, hematuria, flank pain and difficulty urinating.  Endocrine: Denies: hot or cold intolerance, sweats, changes in hair or nails, polyuria, polydipsia. Musculoskeletal: Denies myalgias, back pain, joint swelling, arthralgias and gait problem.  Skin: Denies pallor, rash and wound.  Neurological: Denies dizziness, seizures, syncope, weakness, light-headedness, numbness and headaches.  Hematological: Denies adenopathy. Easy bruising, personal or family bleeding history  Psychiatric/Behavioral: Denies suicidal ideation, mood changes, confusion, nervousness, sleep disturbance and agitation    Physical Exam: Vitals:   02/09/18 1423  BP: 102/64  Pulse: 68  Temp: 97.8 F (36.6 C)  TempSrc: Oral  SpO2: 97%  Weight: 166 lb 4.8 oz (75.4 kg)  Height: 5\' 9"  (1.753 m)    Body mass index is 24.56 kg/m.   Constitutional: NAD, calm, comfortable Eyes: PERRL, lids and conjunctivae normal ENMT: Mucous membranes are moist.  Respiratory: clear to auscultation bilaterally, no wheezing, no crackles. Normal respiratory effort. No accessory muscle use.  Cardiovascular: Regular rate and rhythm, no murmurs / rubs / gallops. No extremity edema. 2+ pedal pulses. No carotid bruits.  Abdomen: no tenderness, no masses palpated. No hepatosplenomegaly. Bowel sounds positive.  Psychiatric: Normal judgment and insight. Alert and oriented x 3. Normal mood.    Impression and Plan:  Prostate cancer ([place]) -Followed by urology, Dr. Louis Meckel. -He has had an increase in PCA and is currently under active surveillance.  Need for shingles vaccine - Plan: Varicella-zoster vaccine IM (Shingrix) -Will receive first immunization in series today.    Patient Instructions  -It was nice meeting you today!  -1/2 shingles vaccines  today.  -Schedule follow up after April 2020 for your annual wellness visit.     Lelon Frohlich, MD [facility] at [facility]

## 2018-06-06 ENCOUNTER — Telehealth: Payer: Self-pay | Admitting: Internal Medicine

## 2018-06-06 NOTE — Telephone Encounter (Signed)
Copied from [place] [id number]. Topic: General - Other >> Jun 06, 2018  2:10 PM Pauline Good wrote: Reason for CRM: pt want to know when can he come and get his 2nd set of Shingles vaccine. He had his last one in January

## 2018-06-07 ENCOUNTER — Ambulatory Visit (INDEPENDENT_AMBULATORY_CARE_PROVIDER_SITE_OTHER): Payer: Medicare Other

## 2018-06-07 ENCOUNTER — Other Ambulatory Visit: Payer: Self-pay

## 2018-06-07 DIAGNOSIS — Z23 Encounter for immunization: Secondary | ICD-10-CM | POA: Diagnosis not present

## 2018-06-07 NOTE — Telephone Encounter (Signed)
Vaccine scheduled for 06/07/2018.

## 2018-08-08 DIAGNOSIS — C61 Malignant neoplasm of prostate: Secondary | ICD-10-CM | POA: Diagnosis not present

## 2018-08-08 DIAGNOSIS — R8279 Other abnormal findings on microbiological examination of urine: Secondary | ICD-10-CM | POA: Diagnosis not present

## 2018-08-12 DIAGNOSIS — C61 Malignant neoplasm of prostate: Secondary | ICD-10-CM | POA: Diagnosis not present

## 2018-08-12 DIAGNOSIS — N5201 Erectile dysfunction due to arterial insufficiency: Secondary | ICD-10-CM | POA: Diagnosis not present

## 2018-08-12 DIAGNOSIS — N3943 Post-void dribbling: Secondary | ICD-10-CM | POA: Diagnosis not present

## 2018-09-21 ENCOUNTER — Encounter: Payer: Self-pay | Admitting: Family Medicine

## 2018-09-21 ENCOUNTER — Other Ambulatory Visit: Payer: Self-pay

## 2018-09-21 ENCOUNTER — Ambulatory Visit (INDEPENDENT_AMBULATORY_CARE_PROVIDER_SITE_OTHER): Payer: Medicare Other | Admitting: Family Medicine

## 2018-09-21 VITALS — BP 110/64 | HR 59 | Temp 97.6°F | Ht 69.0 in | Wt 150.0 lb

## 2018-09-21 DIAGNOSIS — H109 Unspecified conjunctivitis: Secondary | ICD-10-CM

## 2018-09-21 DIAGNOSIS — Z23 Encounter for immunization: Secondary | ICD-10-CM

## 2018-09-21 MED ORDER — POLYMYXIN B-TRIMETHOPRIM 10000-0.1 UNIT/ML-% OP SOLN: 2.0000 [drp] | OPHTHALMIC | 0 refills | Status: DC

## 2018-09-21 NOTE — Progress Notes (Signed)
  Subjective:     Patient ID: John [name], male   DOB: 1950-10-01, 68 y.o.   MRN: [id number]  HPI Patient is seen with concern for recurrent conjunctivitis symptoms.  He states few weeks ago he had some bilateral eye drainage and matting and crusted drainage.  His wife had some leftover Tobrex drops and he used that for about 4 days and symptoms mostly cleared.  He then had recurrence of symptoms a few days ago.  He has some mild drainage and matting mostly early in the mornings.  No blurred vision.  No eye pain.  No contact use.  Past Medical History:  Diagnosis Date  . BPH with elevated PSA   . Chronic constipation   . Family history of coronary artery disease   . Hemorrhoids   . History of colon polyps 2006  . History of hepatitis A age 51  . History of syncope    per pt hx syncopy episodes throughout life--- consultation w/ dr Marlou Porch (cardiologist)--  negative stress test  (per lov note 04-19-2014  vasovagal/ situtional )  . Prostate cancer [facility]) urologist-  dr Louis Meckel   dx 08-23-2015--  Gleason 3+4, PSA 4.75-- active surveillance  . Wears glasses    Past Surgical History:  Procedure Laterality Date  . CARDIOVASCULAR STRESS TEST  05-11-2013   dr Marlou Porch   Low risk nuclear study w/ small fixed defect in the inferolateral wall much more prominent on rest images c/w diaphragmatic attenuation, no evidence ishcemia/  normal LV function and wall motion, ef 62%  . COLONOSCOPY  last one 08-25-2011  . PROSTATE BIOPSY N/A 01/15/2017   Procedure: BIOPSY TRANSRECTAL ULTRASONIC PROSTATE (TUBP);  Surgeon: Ardis Hughs, MD;  Location: [facility];  Service: Urology;  Laterality: N/A;  . TONSILLECTOMY  age 23    reports that he has never smoked. He has never used smokeless tobacco. He reports current alcohol use. He reports that he does not use drugs. family history includes Breast cancer in his mother; Colon polyps in his father; Heart attack in his father; Heart disease in  his brother and mother; Parkinsonism in his mother; Prostate cancer in his maternal aunt. No Known Allergies   Review of Systems  Constitutional: Negative for chills and fever.  Eyes: Positive for discharge and itching. Negative for photophobia, pain and visual disturbance.  Respiratory: Negative for shortness of breath.   Cardiovascular: Negative for chest pain.       Objective:   Physical Exam Constitutional:      Appearance: Normal appearance.  Eyes:     Comments: Only very mild erythema conjunctivo-right greater than left.  Pupils equal round reactive to light.  Cornea appears normal bilaterally.  No periocular rashes  Cardiovascular:     Rate and Rhythm: Normal rate and regular rhythm.  Neurological:     Mental Status: He is alert.        Assessment:     Probable recurrent mild bilateral bacterial conjunctivitis    Plan:     -Continue warm compresses several times daily -Polytrim ophthalmic drops 2 drops in each eye every 4 hours while awake -Follow-up with primary if not fully resolved in the next 4 or 5 days  Eulas Post MD [facility] at [facility]

## 2018-09-21 NOTE — Patient Instructions (Signed)
Bacterial Conjunctivitis, Adult Bacterial conjunctivitis is an infection of the clear membrane that covers the white part of your eye and the inner surface of your eyelid (conjunctiva). When the blood vessels in your conjunctiva become inflamed, your eye becomes red or pink, and it will probably feel itchy. Bacterial conjunctivitis spreads very easily from person to person (is contagious). It also spreads easily from one eye to the other eye. What are the causes? This condition is caused by bacteria. You may get the infection if you come into close contact with:  A person who is infected with the bacteria.  Items that are contaminated with the bacteria, such as a face towel, contact lens solution, or eye makeup. What increases the risk? You are more likely to develop this condition if you:  Are exposed to other people who have the infection.  Wear contact lenses.  Have a sinus infection.  Have had a recent eye injury or surgery.  Have a weak body defense system (immune system).  Have a medical condition that causes dry eyes. What are the signs or symptoms? Symptoms of this condition include:  Thick, yellowish discharge from the eye. This may turn into a crust on the eyelid overnight and cause your eyelids to stick together.  Tearing or watery eyes.  Itchy eyes.  Burning feeling in your eyes.  Eye redness.  Swollen eyelids.  Blurred vision. How is this diagnosed? This condition is diagnosed based on your symptoms and medical history. Your health care provider may also take a sample of discharge from your eye to find the cause of your infection. This is rarely done. How is this treated? This condition may be treated with:  Antibiotic eye drops or ointment to clear the infection more quickly and prevent the spread of infection to others.  Oral antibiotic medicines to treat infections that do not respond to drops or ointments or that last longer than 10 days.  Cool, wet  cloths (cool compresses) placed on the eyes.  Artificial tears applied 2-6 times a day. Follow these instructions at home: Medicines  Take or apply your antibiotic medicine as told by your health care provider. Do not stop taking or applying the antibiotic even if you start to feel better.  Take or apply over-the-counter and prescription medicines only as told by your health care provider.  Be very careful to avoid touching the edge of your eyelid with the eye-drop bottle or the ointment tube when you apply medicines to the affected eye. This will keep you from spreading the infection to your other eye or to other people. Managing discomfort  Gently wipe away any drainage from your eye with a warm, wet washcloth or a cotton ball.  Apply a clean, cool compress to your eye for 10-20 minutes, 3-4 times a day. General instructions  Do not wear contact lenses until the inflammation is gone and your health care provider says it is safe to wear them again. Ask your health care provider how to sterilize or replace your contact lenses before you use them again. Wear glasses until you can resume wearing contact lenses.  Avoid wearing eye makeup until the inflammation is gone. Throw away any old eye cosmetics that may be contaminated.  Change or wash your pillowcase every day.  Do not share towels or washcloths. This may spread the infection.  Wash your hands often with soap and water. Use paper towels to dry your hands.  Avoid touching or rubbing your eyes.  Do not   drive or use heavy machinery if your vision is blurred. Contact a health care provider if:  You have a fever.  Your symptoms do not get better after 10 days. Get help right away if you have:  A fever and your symptoms suddenly get worse.  Severe pain when you move your eye.  Facial pain, redness, or swelling.  Sudden loss of vision. Summary  Bacterial conjunctivitis is an infection of the clear membrane that covers the  white part of your eye and the inner surface of your eyelid (conjunctiva).  Bacterial conjunctivitis spreads very easily from person to person (is contagious).  Wash your hands often with soap and water. Use paper towels to dry your hands.  Take or apply your antibiotic medicine as told by your health care provider. Do not stop taking or applying the antibiotic even if you start to feel better.  Contact a health care provider if you have a fever or your symptoms do not get better after 10 days. This information is not intended to replace advice given to you by your health care provider. Make sure you discuss any questions you have with your health care provider. Document Released: 01/19/2005 Document Revised: 05/10/2018 Document Reviewed: 08/25/2017 Elsevier Patient Education  2020 [employer].

## 2018-09-30 ENCOUNTER — Telehealth: Payer: Self-pay

## 2018-09-30 MED ORDER — TOBRAMYCIN 0.3 % OP SOLN: 2.0000 [drp] | OPHTHALMIC | 0 refills | Status: DC

## 2018-09-30 NOTE — Telephone Encounter (Signed)
Stop Polytrim eyedrops and less try new prescription for Tobrex 2 drops each eye every 4 hours while awake.  Office follow-up early next week if not improving

## 2018-09-30 NOTE — Telephone Encounter (Signed)
Copied from [place] [id number]. Topic: General - Other >> Sep 30, 2018  1:24 PM Sheran Luz wrote: Patient requesting call back from CMA to discuss medication prescribed by Dr. Sarajane Jews for conjunctivitis.

## 2018-09-30 NOTE — Telephone Encounter (Signed)
Spoke with patient. He stated the eye drops Dr. Elease Hashimoto gave him on 09/21/2018 are not helping and that the conjunctivitis has gotten worse.  Please advise.

## 2018-09-30 NOTE — Telephone Encounter (Signed)
Attempted to call patient.  Unable to leave a message.  Rx sent.

## 2018-10-11 DIAGNOSIS — H5203 Hypermetropia, bilateral: Secondary | ICD-10-CM | POA: Diagnosis not present

## 2018-10-11 DIAGNOSIS — H43813 Vitreous degeneration, bilateral: Secondary | ICD-10-CM | POA: Diagnosis not present

## 2018-10-11 DIAGNOSIS — H01002 Unspecified blepharitis right lower eyelid: Secondary | ICD-10-CM | POA: Diagnosis not present

## 2018-10-11 DIAGNOSIS — H01001 Unspecified blepharitis right upper eyelid: Secondary | ICD-10-CM | POA: Diagnosis not present

## 2018-10-11 DIAGNOSIS — H52223 Regular astigmatism, bilateral: Secondary | ICD-10-CM | POA: Diagnosis not present

## 2018-10-11 DIAGNOSIS — H25813 Combined forms of age-related cataract, bilateral: Secondary | ICD-10-CM | POA: Diagnosis not present

## 2018-10-11 DIAGNOSIS — H524 Presbyopia: Secondary | ICD-10-CM | POA: Diagnosis not present

## 2019-03-20 ENCOUNTER — Other Ambulatory Visit: Payer: Self-pay

## 2019-03-20 ENCOUNTER — Ambulatory Visit: Payer: Medicare Other | Attending: Internal Medicine

## 2019-03-20 DIAGNOSIS — Z23 Encounter for immunization: Secondary | ICD-10-CM | POA: Insufficient documentation

## 2019-03-20 NOTE — Progress Notes (Signed)
   Covid-19 Vaccination Clinic  Name:  John [name]    MRN: [id number] DOB: Apr 14, 1950  03/20/2019  [name] was observed post Covid-19 immunization for 15 minutes without incidence. He was provided with Vaccine Information Sheet and instruction to access the V-Safe system.   [name] was instructed to call 911 with any severe reactions post vaccine: [name] Kitchen Difficulty breathing  . Swelling of your face and throat  . A fast heartbeat  . A bad rash all over your body  . Dizziness and weakness    Immunizations Administered    Name Date Dose VIS Date Route   Moderna COVID-19 Vaccine 03/20/2019  2:14 PM 0.5 mL 01/03/2019 Intramuscular   Manufacturer: Moderna   Lot: [id number]   [id number]

## 2019-03-28 DIAGNOSIS — C61 Malignant neoplasm of prostate: Secondary | ICD-10-CM | POA: Diagnosis not present

## 2019-04-03 DIAGNOSIS — C61 Malignant neoplasm of prostate: Secondary | ICD-10-CM | POA: Diagnosis not present

## 2019-04-04 ENCOUNTER — Other Ambulatory Visit: Payer: Self-pay | Admitting: Urology

## 2019-04-04 DIAGNOSIS — C61 Malignant neoplasm of prostate: Secondary | ICD-10-CM

## 2019-04-07 ENCOUNTER — Other Ambulatory Visit (HOSPITAL_COMMUNITY): Payer: Self-pay | Admitting: Urology

## 2019-04-07 ENCOUNTER — Other Ambulatory Visit: Payer: Self-pay | Admitting: Urology

## 2019-04-07 DIAGNOSIS — C61 Malignant neoplasm of prostate: Secondary | ICD-10-CM

## 2019-04-18 ENCOUNTER — Ambulatory Visit: Payer: Medicare Other | Attending: Internal Medicine

## 2019-04-18 DIAGNOSIS — Z23 Encounter for immunization: Secondary | ICD-10-CM

## 2019-04-18 NOTE — Progress Notes (Signed)
   Covid-19 Vaccination Clinic  Name:  John [name]    MRN: [id number] DOB: November 25, 1950  04/18/2019  Mr. [name] was observed post Covid-19 immunization for 15 minutes without incident. He was provided with Vaccine Information Sheet and instruction to access the V-Safe system.   Mr. [name] was instructed to call 911 with any severe reactions post vaccine: [name] Kitchen Difficulty breathing  . Swelling of face and throat  . A fast heartbeat  . A bad rash all over body  . Dizziness and weakness   Immunizations Administered    Name Date Dose VIS Date Route   Moderna COVID-19 Vaccine 04/18/2019  2:27 PM 0.5 mL 01/03/2019 Intramuscular   Manufacturer: Moderna   Lot: [id number]   [id number]

## 2019-05-02 ENCOUNTER — Ambulatory Visit
Admission: RE | Admit: 2019-05-02 | Discharge: 2019-05-02 | Disposition: A | Discharge status: Home / Self Care | Payer: Medicare Other | Source: Ambulatory Visit | Attending: Urology | Admitting: Urology

## 2019-05-02 ENCOUNTER — Other Ambulatory Visit: Payer: Self-pay

## 2019-05-02 ENCOUNTER — Other Ambulatory Visit: Payer: Medicare Other

## 2019-05-02 DIAGNOSIS — C61 Malignant neoplasm of prostate: Secondary | ICD-10-CM | POA: Diagnosis not present

## 2019-05-02 MED ORDER — GADOBENATE DIMEGLUMINE 529 MG/ML IV SOLN
13.0000 mL | Freq: Once | INTRAVENOUS | Status: AC | PRN
  Administered 2019-05-02: 13 mL via INTRAVENOUS

## 2019-05-11 ENCOUNTER — Encounter (HOSPITAL_BASED_OUTPATIENT_CLINIC_OR_DEPARTMENT_OTHER): Payer: Self-pay | Admitting: Urology

## 2019-05-11 ENCOUNTER — Other Ambulatory Visit: Payer: Self-pay

## 2019-05-11 NOTE — Progress Notes (Signed)
Spoke w/ via phone for pre-op interview---patient Lab needs dos----none              COVID test ------05-15-2019 250 pm Arrive at -------800 am 05-18-2019 NPO after ------midnight Medications to take morning of surgery -----none Diabetic medication ----- Patient Special Instructions -----fleets enema am of surgery Pre-Op special Istructions -----none Patient verbalized understanding of instructions that were given at this phone interview. Patient denies shortness of breath, chest pain, fever, cough a this phone interview.

## 2019-05-15 ENCOUNTER — Other Ambulatory Visit (HOSPITAL_COMMUNITY)
Admission: RE | Admit: 2019-05-15 | Discharge: 2019-05-15 | Disposition: A | Discharge status: Home / Self Care | Payer: Medicare Other | Source: Ambulatory Visit | Attending: Urology | Admitting: Urology

## 2019-05-15 DIAGNOSIS — Z01812 Encounter for preprocedural laboratory examination: Secondary | ICD-10-CM | POA: Diagnosis not present

## 2019-05-15 DIAGNOSIS — Z20822 Contact with and (suspected) exposure to covid-19: Secondary | ICD-10-CM | POA: Insufficient documentation

## 2019-05-15 LAB — SARS CORONAVIRUS 2 (TAT 6-24 HRS): SARS Coronavirus 2: NEGATIVE

## 2019-05-18 ENCOUNTER — Encounter (HOSPITAL_BASED_OUTPATIENT_CLINIC_OR_DEPARTMENT_OTHER): Payer: Self-pay | Admitting: Urology

## 2019-05-18 ENCOUNTER — Ambulatory Visit (HOSPITAL_BASED_OUTPATIENT_CLINIC_OR_DEPARTMENT_OTHER): Payer: Medicare Other | Admitting: Anesthesiology

## 2019-05-18 ENCOUNTER — Encounter (HOSPITAL_BASED_OUTPATIENT_CLINIC_OR_DEPARTMENT_OTHER)
Admission: RE | Disposition: A | Discharge status: Home / Self Care | Payer: Self-pay | Source: Home / Self Care | Attending: Urology

## 2019-05-18 ENCOUNTER — Ambulatory Visit (HOSPITAL_COMMUNITY)
Admission: RE | Admit: 2019-05-18 | Discharge: 2019-05-18 | Disposition: A | Discharge status: Home / Self Care | Payer: Medicare Other | Source: Ambulatory Visit | Attending: Urology | Admitting: Urology

## 2019-05-18 ENCOUNTER — Ambulatory Visit (HOSPITAL_BASED_OUTPATIENT_CLINIC_OR_DEPARTMENT_OTHER)
Admission: RE | Admit: 2019-05-18 | Discharge: 2019-05-18 | Disposition: A | Discharge status: Home / Self Care | Payer: Medicare Other | Attending: Urology | Admitting: Urology

## 2019-05-18 DIAGNOSIS — M542 Cervicalgia: Secondary | ICD-10-CM | POA: Diagnosis not present

## 2019-05-18 DIAGNOSIS — C61 Malignant neoplasm of prostate: Secondary | ICD-10-CM

## 2019-05-18 DIAGNOSIS — Z79899 Other long term (current) drug therapy: Secondary | ICD-10-CM | POA: Insufficient documentation

## 2019-05-18 DIAGNOSIS — R32 Unspecified urinary incontinence: Secondary | ICD-10-CM | POA: Diagnosis not present

## 2019-05-18 DIAGNOSIS — R5383 Other fatigue: Secondary | ICD-10-CM | POA: Diagnosis not present

## 2019-05-18 DIAGNOSIS — N5201 Erectile dysfunction due to arterial insufficiency: Secondary | ICD-10-CM | POA: Insufficient documentation

## 2019-05-18 DIAGNOSIS — K644 Residual hemorrhoidal skin tags: Secondary | ICD-10-CM | POA: Diagnosis not present

## 2019-05-18 HISTORY — PX: PROSTATE BIOPSY: SHX241

## 2019-05-18 SURGERY — BIOPSY, PROSTATE, RECTAL APPROACH, WITH US GUIDANCE
Anesthesia: General | Site: Rectum

## 2019-05-18 MED ORDER — SODIUM CHLORIDE 0.9 % IV SOLN
1.0000 g | INTRAVENOUS | Status: AC
  Administered 2019-05-18: 1 g via INTRAVENOUS
  Filled 2019-05-18: qty 10

## 2019-05-18 MED ORDER — EPHEDRINE 5 MG/ML INJ
INTRAVENOUS | Status: AC
  Filled 2019-05-18: qty 10

## 2019-05-18 MED ORDER — PROPOFOL 10 MG/ML IV BOLUS
INTRAVENOUS | Status: AC
  Filled 2019-05-18: qty 20

## 2019-05-18 MED ORDER — FENTANYL CITRATE (PF) 100 MCG/2ML IJ SOLN
INTRAMUSCULAR | Status: DC | PRN
  Administered 2019-05-18: 50 ug via INTRAVENOUS

## 2019-05-18 MED ORDER — FENTANYL CITRATE (PF) 100 MCG/2ML IJ SOLN
INTRAMUSCULAR | Status: AC
  Filled 2019-05-18: qty 2

## 2019-05-18 MED ORDER — PROPOFOL 10 MG/ML IV BOLUS
INTRAVENOUS | Status: DC | PRN
  Administered 2019-05-18: 30 mg via INTRAVENOUS

## 2019-05-18 MED ORDER — FLEET ENEMA 7-19 GM/118ML RE ENEM
1.0000 | ENEMA | Freq: Once | RECTAL | Status: DC
  Filled 2019-05-18: qty 1

## 2019-05-18 MED ORDER — LIDOCAINE 2% (20 MG/ML) 5 ML SYRINGE
INTRAMUSCULAR | Status: AC
  Filled 2019-05-18: qty 5

## 2019-05-18 MED ORDER — EPHEDRINE SULFATE-NACL 50-0.9 MG/10ML-% IV SOSY
PREFILLED_SYRINGE | INTRAVENOUS | Status: DC | PRN
  Administered 2019-05-18: 10 mg via INTRAVENOUS

## 2019-05-18 MED ORDER — ACETAMINOPHEN 500 MG PO TABS
ORAL_TABLET | ORAL | Status: AC
  Filled 2019-05-18: qty 2

## 2019-05-18 MED ORDER — LIDOCAINE 2% (20 MG/ML) 5 ML SYRINGE
INTRAMUSCULAR | Status: DC | PRN
  Administered 2019-05-18: 60 mg via INTRAVENOUS

## 2019-05-18 MED ORDER — MIDAZOLAM HCL 2 MG/2ML IJ SOLN
INTRAMUSCULAR | Status: AC
  Filled 2019-05-18: qty 2

## 2019-05-18 MED ORDER — CEFTRIAXONE SODIUM 1 G IJ SOLR
INTRAMUSCULAR | Status: AC
  Filled 2019-05-18: qty 10

## 2019-05-18 MED ORDER — MIDAZOLAM HCL 2 MG/2ML IJ SOLN
INTRAMUSCULAR | Status: DC | PRN
  Administered 2019-05-18: 1 mg via INTRAVENOUS

## 2019-05-18 MED ORDER — PROPOFOL 500 MG/50ML IV EMUL
INTRAVENOUS | Status: DC | PRN
  Administered 2019-05-18: 150 ug/kg/min via INTRAVENOUS

## 2019-05-18 MED ORDER — ONDANSETRON HCL 4 MG/2ML IJ SOLN
INTRAMUSCULAR | Status: AC
  Filled 2019-05-18: qty 2

## 2019-05-18 MED ORDER — LIDOCAINE HCL 2 % IJ SOLN
INTRAMUSCULAR | Status: DC | PRN
  Administered 2019-05-18: 10 mL

## 2019-05-18 MED ORDER — SODIUM CHLORIDE 0.9 % IV SOLN
INTRAVENOUS | Status: DC
  Filled 2019-05-18: qty 1000

## 2019-05-18 MED ORDER — SODIUM CHLORIDE 0.9 % IV SOLN
INTRAVENOUS | Status: AC
  Filled 2019-05-18: qty 100

## 2019-05-18 MED ORDER — ACETAMINOPHEN 500 MG PO TABS
1000.0000 mg | ORAL_TABLET | Freq: Once | ORAL | Status: AC
  Administered 2019-05-18: 1000 mg via ORAL
  Filled 2019-05-18: qty 2

## 2019-05-18 SURGICAL SUPPLY — 19 items
GAUZE SPONGE 4X4 12PLY STRL (GAUZE/BANDAGES/DRESSINGS) ×2 IMPLANT
GLOVE BIO SURGEON STRL SZ7.5 (GLOVE) IMPLANT
GLOVE BIOGEL PI IND STRL 7.0 (GLOVE) IMPLANT
GLOVE BIOGEL PI INDICATOR 7.0 (GLOVE) ×2
INST BIOPSY MAXCORE 18GX25 (NEEDLE) IMPLANT
INSTR BIOPSY MAXCORE 18GX20 (NEEDLE) ×2 IMPLANT
KIT TURNOVER CYSTO (KITS) ×3 IMPLANT
MANIFOLD NEPTUNE II (INSTRUMENTS) IMPLANT
NDL SAFETY ECLIPSE 18X1.5 (NEEDLE) IMPLANT
NDL SPNL 22GX7 QUINCKE BK (NEEDLE) IMPLANT
NEEDLE HYPO 18GX1.5 SHARP (NEEDLE)
NEEDLE HYPO 22GX1.5 SAFETY (NEEDLE) ×2 IMPLANT
NEEDLE SPNL 22GX7 QUINCKE BK (NEEDLE) ×3 IMPLANT
SURGILUBE 2OZ TUBE FLIPTOP (MISCELLANEOUS) ×3 IMPLANT
SYR CONTROL 10ML LL (SYRINGE) ×2 IMPLANT
TOWEL OR 17X26 10 PK STRL BLUE (TOWEL DISPOSABLE) IMPLANT
TUBE CONNECTING 12'X1/4 (SUCTIONS)
TUBE CONNECTING 12X1/4 (SUCTIONS) IMPLANT
UNDERPAD 30X30 (UNDERPADS AND DIAPERS) ×3 IMPLANT

## 2019-05-18 NOTE — Anesthesia Preprocedure Evaluation (Addendum)
Anesthesia Evaluation  Patient identified by MRN, date of birth, ID band Patient awake    Reviewed: Allergy & Precautions, NPO status , Patient's Chart, lab work & pertinent test results  Airway Mallampati: II  TM Distance: >3 FB Neck ROM: Full    Dental no notable dental hx.    Pulmonary neg pulmonary ROS,    Pulmonary exam normal breath sounds clear to auscultation       Cardiovascular negative cardio ROS Normal cardiovascular exam Rhythm:Regular Rate:Normal     Neuro/Psych negative neurological ROS  negative psych ROS   GI/Hepatic negative GI ROS, Neg liver ROS,   Endo/Other  negative endocrine ROS  Renal/GU negative Renal ROS     Musculoskeletal negative musculoskeletal ROS (+)   Abdominal   Peds  Hematology negative hematology ROS (+)   Anesthesia Other Findings PROSTATE CANCER  Reproductive/Obstetrics                            Anesthesia Physical Anesthesia Plan  ASA: I  Anesthesia Plan: MAC   Post-op Pain Management:    Induction: Intravenous  PONV Risk Score and Plan: 2 and Ondansetron, Dexamethasone, Treatment may vary due to age or medical condition and Propofol infusion  Airway Management Planned: Mask  Additional Equipment:   Intra-op Plan:   Post-operative Plan:   Informed Consent: I have reviewed the patients History and Physical, chart, labs and discussed the procedure including the risks, benefits and alternatives for the proposed anesthesia with the patient or authorized representative who has indicated his/her understanding and acceptance.     Dental advisory given  Plan Discussed with: CRNA  Anesthesia Plan Comments:       Anesthesia Quick Evaluation

## 2019-05-18 NOTE — Interval H&P Note (Signed)
History and Physical Interval Note:  05/18/2019 11:03 AM  John [name]  has presented today for surgery, with the diagnosis of PROSTATE CANCER.  The various methods of treatment have been discussed with the patient and family. After consideration of risks, benefits and other options for treatment, the patient has consented to  Procedure(s): SATURATION BIOPSY TRANSRECTAL ULTRASONIC PROSTATE (TUBP) (N/A) as a surgical intervention.  The patient's history has been reviewed, patient examined, no change in status, stable for surgery.  I have reviewed the patient's chart and labs.  Questions were answered to the patient's satisfaction.     Ardis Hughs

## 2019-05-18 NOTE — Op Note (Signed)
Preoperative diagnosis:  1. Prostate cancer   Postoperative diagnosis:  1. same   Procedure: 1. Transrectal ultrasound 2. Transrectal ultrasound-guided saturation prostate biopsy  Surgeon: Ardis Hughs, MD  Anesthesia: General  Complications: None  Intraoperative findings: #1: The patient's prostate was measured to be approximately 34 g.  There was no significant median lobe.  There was hypoechoic area in the left anterior apical mid region consistent with the patient's MRI findings.  EBL: Minimal  Specimens: The standard 12 core biopsy template was performed taking to biopsies from each section.  In addition, I took 2 specimens on the anterior aspect of the prostate on each side.  I also took 3 separate biopsies of the left anterior apical region of interest.  In total, 30 biopsy cores were obtained.  Indication: John [name] is a 69 y.o. patient with history of very low volume favorable intermediate risk prostate cancer.  His PSA has continued to rise.  Prostate MRI was repeated and it demonstrated the area of concern in the left apical region had progressed.  As such I recommended that he strongly consider prostate biopsy.  Given his intolerance in the office, he requested this be done under general anesthesia.  After reviewing the management options for treatment, he elected to proceed with the above surgical procedure(s). We have discussed the potential benefits and risks of the procedure, side effects of the proposed treatment, the likelihood of the patient achieving the goals of the procedure, and any potential problems that might occur during the procedure or recuperation. Informed consent has been obtained.  Description of procedure:  The patient was taken to the operating room and conscious sedation was induced.  The patient was then placed in the left lateral decubitus position.  Timeout was then performed.  The transrectal ultrasound was then inserted and a  transrectal ultrasound was performed in the routine fashion.  His volume was measured to be approximately 34 g.  The findings were as described above.  I then instilled a total of 10 cc of 1% lidocaine and the prostatic-seminal vesicle angle bilaterally as well as the apex bilaterally.  I then performed a 12 core biopsy template per routine, but obtained to biopsy specimens in each location.  I took 2 additional biopsies on the anterior aspect of both sides as well as 3 separate biopsies for the region of interest in the left anterior apical region.  The patient was subsequently awoken and returned to the PACU in stable condition.  Ardis Hughs, M.D.

## 2019-05-18 NOTE — Anesthesia Postprocedure Evaluation (Signed)
Anesthesia Post Note  Patient: John [name]  Procedure(s) Performed: SATURATION BIOPSY TRANSRECTAL ULTRASONIC PROSTATE (TUBP) (N/A Rectum)     Patient location during evaluation: PACU Anesthesia Type: MAC Level of consciousness: awake and alert Pain management: pain level controlled Vital Signs Assessment: post-procedure vital signs reviewed and stable Respiratory status: spontaneous breathing, nonlabored ventilation, respiratory function stable and patient connected to nasal cannula oxygen Cardiovascular status: stable and blood pressure returned to baseline Postop Assessment: no apparent nausea or vomiting Anesthetic complications: no    Last Vitals:  Vitals:   05/18/19 1300 05/18/19 1330  BP: 135/82 110/82  Pulse: 67 69  Resp: 12 16  Temp:  36.7 C  SpO2: 100% 100%    Last Pain:  Vitals:   05/18/19 1330  TempSrc:   PainSc: 0-No pain                 Smriti Barkow P Talon Witting

## 2019-05-18 NOTE — Anesthesia Procedure Notes (Signed)
Procedure Name: MAC Date/Time: 05/18/2019 11:25 AM Performed by: Mechele Claude, CRNA Pre-anesthesia Checklist: Patient identified, Emergency Drugs available, Suction available and Patient being monitored Oxygen Delivery Method: Simple face mask Placement Confirmation: positive ETCO2 and breath sounds checked- equal and bilateral

## 2019-05-18 NOTE — H&P (Signed)
? Clipboard f/u for Prostate Cancer  HPI: John [name] is a 69 year-old male established patient who is here for f/u while on Active Surveillance for Prostate Cancer .  The patient was last seen Jul/2020.   He was diagnosed with prostate cancer in 08/23/2015. At the time of his prostate cancer diagnosis his PSA was 4.75. The patient's Gleason score at the time of diagnosis was 3+4=7. There were 1 positive cores on his initial biospy. He has had 2 biopsy(ies). The patient's most recent biopsy was approximately 01/14/2017.   The patient has had a prostate MRI. His prostate MRI was in 08/22/2015. This showed: A 10 mm anterior left mid apical nodule suspicious for peripheral zone carcinoma. A more clinical area of non-mass-like T2 hyperintensity within the posterior left apex course positive vague postcontrast enhancement and restricted diffusion.[name] Kitchen   His most recent PSA is 11.90. This was drawn on approximately 03/27/2019. PSA History: 7/20: 8.64, 12/19: 7.97, 6/17: 6.69, 12/18: 5.86, 11/18: 5.46, 5/18: 4.31, 2/18: 5.5 (11% free).   He does not have problems with erections. He does have urinary incontinence. He does not wear pads. He does have an abnormal sensation when he needs to urinate. He does not have to strain or bear down to start his urinary stream.   He is not having pain in new locations. He does have a good appetite. His bowels are moving normally. He has not seen blood in his stool since the biopsy. He has not recently had unwanted weight loss.   MSK nomogram following surgery:  PFS - 93% at 5 yrs, 88% 10 yrs  LNI- 1%   The patient presents today for follow-up for his prostate cancer. He is on active surveillance. He has not had any progressive worsening of his lower urinary tract symptoms. He denies any dysuria or hematuria.   The patient does state that he has chronic hemorrhoids in uses steroids on a daily basis for his hemorrhoids and rectal discomfort. He also feels some constant  pressure discomfort in this area.   Patient states that he has some mild postvoid dribbling. His erectile function remains adequate with 20-40 mg of sildenafil. Denies any hematuria dysuria.     AUA Symptom Score: He never has the sensation of not emptying his bladder completely after finishing urinating. He never has to urinate again less that two hours after he has finished urinating. He does not have to stop and start again several times when he urinates. He never finds it difficult to postpone urination. Less than 20% of the time he has a weak urinary stream. He never has to push or strain to begin urination. He never has to get up to urinate from the time he goes to bed until the time he gets up in the morning.   Calculated AUA Symptom Score: 1    QOL Score: He would feel delighted if he had to live with his urinary condition the way it is now for the rest of his life.   Calculated QOL Symptom Score: 0    ALLERGIES: No Allergies    MEDICATIONS: Docusate Sodium 100 mg tablet Oral  Hydrocortisone Acetate 25 mg suppository, rectal Rectal  Probiotic 250 mg capsule Oral  Sildenafil Citrate 20 mg tablet 2-5 tablets as needed prior to intimacy  Triamcinolone Acetonide 0.1 % cream External  Vitamin D3 50 mcg (2,000 unit) capsule Oral     GU PSH: Prostate Needle Biopsy - 01/15/2017, 2017       PSH Notes:  Complete Colonoscopy, Tonsillectomy   NON-GU PSH: Diagnostic Colonoscopy - 2011 Remove Tonsils - 2011 Surgical Pathology, Gross And Microscopic Examination For Prostate Needle - 2017     GU PMH: Post-void dribbling - 08/12/2018 Prostate Cancer - 08/12/2018, - 02/08/2018, - 07/26/2017, - 01/21/2017, - 01/08/2017, - 2018, The patient is on active surveillance for his prostate cancer. His PSA did rise over the last 6 months which is concerning but at this point we will continue to monitor closely., - 2018, - 2017 Elevated PSA, Elevated prostate specific antigen (PSA) - 2017 BPH w/LUTS,  Benign localized prostatic hyperplasia with lower urinary tract symptoms (LUTS) - 2017 ED due to arterial insufficiency, Erectile dysfunction due to arterial insufficiency - 2017 BPH w/o LUTS, BPH without urinary obstruction - 2016 Hematospermia, Hematospermia - 2016    NON-GU PMH: Encounter for general adult medical examination without abnormal findings, Encounter for preventive health examination - 2017 Personal history of other infectious and parasitic diseases, History of hepatitis - 2014    FAMILY HISTORY: Breast Cancer - Runs In Family Family Health Status Number - Runs In Family Father Deceased At Age11 ___ - Runs In Family Mother Deceased At Age 65 from diabetic complicati - Runs In Family No pertinent family history - Other   SOCIAL HISTORY: Marital Status: Married Preferred Language: English; Ethnicity: Not Hispanic Or Latino; Race: White     Notes: Never A Smoker, Caffeine Use, Marital History - Currently Married, Alcohol Use, Tobacco Use   REVIEW OF SYSTEMS:    GU Review Male:   Patient denies burning/ pain with urination, erection problems, stream starts and stops, have to strain to urinate , penile pain, hard to postpone urination, frequent urination, leakage of urine, trouble starting your stream, and get up at night to urinate.  Gastrointestinal (Upper):   Patient denies nausea, vomiting, and indigestion/ heartburn.  Gastrointestinal (Lower):   Patient denies diarrhea and constipation.  Constitutional:   Patient denies fever, night sweats, weight loss, and fatigue.  Skin:   Patient denies skin rash/ lesion and itching.  Eyes:   Patient denies blurred vision and double vision.  Ears/ Nose/ Throat:   Patient denies sore throat and sinus problems.  Hematologic/Lymphatic:   Patient denies swollen glands and easy bruising.  Cardiovascular:   Patient denies leg swelling and chest pains.  Respiratory:   Patient denies cough and shortness of breath.  Endocrine:   Patient  denies excessive thirst.  Musculoskeletal:   Patient denies back pain and joint pain.  Neurological:   Patient denies headaches and dizziness.  Psychologic:   Patient denies depression and anxiety.   VITAL SIGNS:      04/03/2019 03:25 PM  Weight 147 lb / 66.68 kg  Height 68 in / 172.72 cm  BP 105/70 mmHg  Heart Rate 58 /min  Temperature 97.7 F / 36.5 C  BMI 22.3 kg/m   PAST DATA REVIEWED:  Source Of History:  Patient  Lab Test Review:   PSA  Records Review:   Pathology Reports, Previous Doctor Records, Previous Patient Records, POC Tool  Urine Test Review:   Urinalysis  X-Ray Review: MRI Prostate GSORAD: Reviewed Films. Discussed With Patient.     03/29/19 08/09/18 01/17/18 07/19/17 01/08/17 12/07/16 06/08/16 03/09/16  PSA  Total PSA 11.90 ng/mL 8.64 ng/mL 7.97 ng/mL 6.69 ng/mL 5.85 ng/mL 5.46 ng/mL 4.31 ng/dl 5.50 ng/dl  Free PSA   0.72 ng/mL 0.50 ng/mL  0.47 ng/mL 0.32 ng/dl 0.62 ng/dl  % Free PSA   9 %  PSA 7 % PSA  9 % PSA 7 % 11 %    PROCEDURES:          Urinalysis - 81003 Dipstick Dipstick Cont'd  Color: Yellow Bilirubin: Neg  Appearance: Clear Ketones: Neg  Specific Gravity: 1.025 Blood: Neg  pH: <=5.0 Protein: Neg  Glucose: Neg Urobilinogen: 0.2    Nitrites: Neg    Leukocyte Esterase: Neg    Notes:      ASSESSMENT:      ICD-10 Details  1 GU:   Prostate Cancer - C61      PLAN:            Medications Refill Meds: Sildenafil Citrate 20 mg tablet 2-5 tablets as needed prior to intimacy   #100  1 Refill(s)            Orders         Schedule X-Rays: 1 Week - MRI Prostate GSORAD With and Without I.V. Contrast          Document Letter(s):  Created for Patient: Clinical Summary         Notes:   The patient's PSA continues to rise. Our plan is to obtain a prostate MRI and then doing saturation biopsy in the OR. He has not tolerated awake biopsies well. As such will obtain prostate MRI to ascertain any significant areas and then perform cognitive  fusion biopsy. If the biopsy demonstrates no significant progression of his prostate cancer I then recommended that he see pelvic floor physical therapy for the sore perineal muscles that he has had chronically.

## 2019-05-18 NOTE — Transfer of Care (Signed)
    Last Vitals:  Vitals Value Taken Time  BP 113/80 05/18/19 1200  Temp    Pulse 65 05/18/19 1201  Resp 16 05/18/19 1201  SpO2 100 % 05/18/19 1201  Vitals shown include unvalidated device data.  Last Pain:  Vitals:   05/18/19 0841  TempSrc: Oral  PainSc: 2       Patients Stated Pain Goal: 3 (05/18/19 0841) Immediate Anesthesia Transfer of Care Note  Patient: John [name]  Procedure(s) Performed: Procedure(s) (LRB): SATURATION BIOPSY TRANSRECTAL ULTRASONIC PROSTATE (TUBP) (N/A)  Patient Location: PACU  Anesthesia Type:MAC  Level of Consciousness: awake, alert  and oriented  Airway & Oxygen Therapy: Patient Spontanous Breathing and Patient connected to face mask oxygen  Post-op Assessment: Report given to PACU RN and Post -op Vital signs reviewed and stable  Post vital signs: Reviewed and stable  Complications: No apparent anesthesia complications

## 2019-05-18 NOTE — Discharge Instructions (Signed)
For several days the patient:   should increase his fluid intake and limit strenuous activity.  he might have mild discomfort at the base of his penis or in his rectum.  he might have blood in his urine or blood in his bowel movements.  For 2-3 months he might have blood in his ejaculate (semen).  Call the office immedicately:   for blood clots in the urine or bowel movements,   difficulty urinating,   inability to urinate,   urinary retention,   painful or frequent urination,   fever, chills,   nausea, vomiting,  other illness.    The prostate biopsy pathology reports are usually available within 3-5 working days, unless a pathologic second opinion is required, which may take 7-14 days.   [facility] to check on the status of his biopsy if he has not heard from [place] within 7 days.  [facility]:  [phone]    Post Anesthesia Home Care Instructions  Activity: Get plenty of rest for the remainder of the day. A responsible individual must stay with you for 24 hours following the procedure.  For the next 24 hours, DO NOT: -Drive a car -Paediatric nurse -Drink alcoholic beverages -Take any medication unless instructed by your physician -Make any legal decisions or sign important papers.  Meals: Start with liquid foods such as gelatin or soup. Progress to regular foods as tolerated. Avoid greasy, spicy, heavy foods. If nausea and/or vomiting occur, drink only clear liquids until the nausea and/or vomiting subsides. Call your physician if vomiting continues.  Special Instructions/Symptoms: Your throat may feel dry or sore from the anesthesia or the breathing tube placed in your throat during surgery. If this causes discomfort, gargle with warm salt water. The discomfort should disappear within 24 hours.  If you had a scopolamine patch placed behind your ear for the management of post- operative nausea and/or vomiting:  1. The medication in the  patch is effective for 72 hours, after which it should be removed.  Wrap patch in a tissue and discard in the trash. Wash hands thoroughly with soap and water. 2. You may remove the patch earlier than 72 hours if you experience unpleasant side effects which may include dry mouth, dizziness or visual disturbances. 3. Avoid touching the patch. Wash your hands with soap and water after contact with the patch.

## 2019-05-21 LAB — SURGICAL PATHOLOGY

## 2019-06-01 DIAGNOSIS — C61 Malignant neoplasm of prostate: Secondary | ICD-10-CM | POA: Diagnosis not present

## 2019-06-19 NOTE — Progress Notes (Signed)
GU Location of Tumor / Histology: prostatic adenocarcinoma  If Prostate Cancer, Gleason Score is (3 + 4) and PSA is (11.90). Prostate volume: 33.81  Manon Hilding [place] with history of very low volume favorable intermediate risk prostate cancer dx 08/23/2015.  His PSA has continued to rise.  Prostate MRI was repeated and it demonstrated the area of concern in the left apical region had progressed.  Biopsies of prostate (if applicable) revealed:  FINAL MICROSCOPIC DIAGNOSIS:   A. PROSTATE, RIGHT BASE LATERAL, BIOPSY x2:  Benign prostatic tissue   B. PROSTATE, RIGHT MID LATERAL, BIOPSY x2:  Benign prostatic tissue   C. PROSTATE, RIGHT APEX LATERAL, BIOPSY x2:  Benign prostatic tissue   D. PROSTATE, RIGHT BASE MEDIAL, BIOPSY x2:  Benign prostatic tissue   E. PROSTATE, RIGHT MID MEDIAL, BIOPSY x2:  Benign prostatic tissue   F. PROSTATE, RIGHT APEX MEDIAL, BIOPSY x2:  Benign prostatic tissue   G. PROSTATE, RIGHT ANTERIOR, BIOPSY x2:  Benign prostatic tissue   H. PROSTATE, LEFT APEX ANTERIOR ROI, BIOPSY x3:  Prostatic adenocarcinoma, Gleason score 3+4=7 (grade group 2) involves  three cores (50%, 40%, 20%, pattern 4=20%).   I. PROSTATE, LEFT BASE LATERAL, BIOPSY x2:  Benign prostatic tissue   J. PROSTATE, LEFT MID LATERAL, BIOPSY x2:  Small focus of Prostatic adenocarcinoma, Gleason score 3+4=7 involves 5%  of one of two cores.   K. PROSTATE, LEFT APEX LATERAL, BIOPSY x2:  Benign prostatic tissue   L. PROSTATE, LEFT BASE MEDIAL, BIOPSY x2:  Benign prostatic tissue   M. PROSTATE, LEFT MID MEDIAL, BIOPSY x2:  Benign prostatic tissue   N. PROSTATE, LEFT APEX MEDIAL, BIOPSY x2:  Prostatic adenocarcinoma, Gleason score 3+4=7 involves 20% of one of two  cores (pattern 4=20%).   O. PROSTATE, LEFT ANTERIOR, BIOPSY x2:  Small focus of prostatic adenocarcinoma, Gleason score 3+3=6 involves 5%  of one of two cores.   Past/Anticipated interventions by urology, if any: prostate  biopsy, active surveillance, repeat biopsy  Past/Anticipated interventions by medical oncology, if any: no  Weight changes, if any: no  Bowel/Bladder complaints, if any: IPSS 4. SHIM 25 with aid of sildenafil. Patient states that he has some mild postvoid dribbling. His erectile function remains adequate with 20-40 mg of sildenafil. Denies any hematuria dysuria.  The patient does state that he has chronic hemorrhoids in uses steroids on a daily basis for his hemorrhoids and rectal discomfort. He also feels some constant pressure discomfort in this area.    Nausea/Vomiting, if any: no  Pain issues, if any:  no  SAFETY ISSUES:  Prior radiation? denies  Pacemaker/ICD? denies  Possible current pregnancy? no, male patient  Is the patient on methotrexate? denies  Current Complaints / other details:  69 year old male. Married.

## 2019-06-20 ENCOUNTER — Other Ambulatory Visit: Payer: Self-pay

## 2019-06-20 ENCOUNTER — Ambulatory Visit: Payer: Medicare Other | Admitting: Radiation Oncology

## 2019-06-20 ENCOUNTER — Encounter: Payer: Self-pay | Admitting: Radiation Oncology

## 2019-06-20 ENCOUNTER — Ambulatory Visit
Admission: RE | Admit: 2019-06-20 | Discharge: 2019-06-20 | Disposition: A | Discharge status: Home / Self Care | Payer: Medicare Other | Source: Ambulatory Visit | Attending: Radiation Oncology | Admitting: Radiation Oncology

## 2019-06-20 ENCOUNTER — Ambulatory Visit: Payer: Medicare Other

## 2019-06-20 ENCOUNTER — Encounter: Payer: Self-pay | Admitting: Urology

## 2019-06-20 VITALS — Ht 69.0 in | Wt 149.0 lb

## 2019-06-20 DIAGNOSIS — N400 Enlarged prostate without lower urinary tract symptoms: Secondary | ICD-10-CM | POA: Diagnosis not present

## 2019-06-20 DIAGNOSIS — Z803 Family history of malignant neoplasm of breast: Secondary | ICD-10-CM | POA: Diagnosis not present

## 2019-06-20 DIAGNOSIS — R972 Elevated prostate specific antigen [PSA]: Secondary | ICD-10-CM | POA: Diagnosis not present

## 2019-06-20 DIAGNOSIS — C61 Malignant neoplasm of prostate: Secondary | ICD-10-CM

## 2019-06-20 NOTE — Progress Notes (Signed)
Radiation Oncology         [phone] ________________________________  Initial Outpatient Consultation - Conducted via Telephone due to current COVID-19 concerns for limiting patient exposure  Name: John [name] MRN: [id number]  Date: 06/20/2019  DOB: 30-Mar-1950  [id number] [name], MD  Ardis Hughs, MD   REFERRING PHYSICIAN: Ardis Hughs, MD  DIAGNOSIS: 69 y.o. gentleman with Stage T2a adenocarcinoma of the prostate with Gleason score of 3+4, and PSA of 11.9.    ICD-10-CM   1. Malignant neoplasm of prostate ([place])  C61     HISTORY OF PRESENT ILLNESS: John [name] is a 69 y.o. male with a diagnosis of prostate cancer. He was initially referred to Dr. Louis Meckel in 06/2015 for an elevated PSA of 4.26. A prostate nodule was noted on DRE.  Therefore, proceeded to prostate MRI on 07/19/2015 showing a 10 mm anterior left mid to apical gland nodule without pelvic adenopathy or evidence of bone metastasis. An MRI fusion biopsy was performed on 08/23/2015 and confirme low volume (10%) Gleason 3+4 prostate cancer in 1/17 cores. All ROI samples were benign.  After a detailed discussion about treatment options, he elected to proceed in active surveillance and has been followed closely since that time  His PSA rose to 5.85 in 01/2017. Surveillance biopsy performed on 01/15/2017 showed only low volume (5%) Gleason 3+3 disease in 1/12 core, in the left apex nodule and he therefore elected to continue with active surveillance.  His PSA remained stable between 5.5 and 6 until June 2019 when it was noted further increased at 6.69.  Since that time, the PSA has continued to rise at 7.97 in December 2019 and 8.64 in July 2020.  His most recent PSA was 11.9 in 03/2019. He proceeded to surveillance prostate MRI on 05/02/2019, which revealed progression of the dominant left anterior mid to apical gland nodule with findings consistent with trans-capsular spread but no pelvic adenopathy or  evidence of bony metastasis.  The patient proceeded to have a saturation biopsy of the prostate under anesthesia on 05/18/2019.  The prostate volume measured 34 cc.  Out of 31 core biopsies, 6 were positive.  The maximum Gleason score was 3+4, and this was seen in the left apex (1/2 cores), left mid lateral (1/2 cores, small focus), and left apex ROI (3/3 cores).  Additionally, a small focus of Gleason 3+3 was noted in one of the samples from the left anterior gland.  The patient reviewed the biopsy results with his urologist and he has kindly been referred today for discussion of potential radiation treatment options.   PREVIOUS RADIATION THERAPY: No  PAST MEDICAL HISTORY:  Past Medical History:  Diagnosis Date  . BPH with elevated PSA   . Chronic constipation   . Family history of coronary artery disease   . Hemorrhoids   . History of colon polyps 2006  . History of hepatitis A age 14  . History of syncope    per pt hx syncopy episodes throughout life--- consultation w/ dr Marlou Porch (cardiologist)--  negative stress test  (per lov note 04-19-2014  vasovagal/ situtional )  . Prostate cancer [facility]) urologist-  dr Louis Meckel   dx 08-23-2015--  Gleason 3+4, PSA 4.75-- active surveillance  . Wears glasses       PAST SURGICAL HISTORY: Past Surgical History:  Procedure Laterality Date  . CARDIOVASCULAR STRESS TEST  05-11-2013   dr Marlou Porch   Low risk nuclear study w/ small fixed defect in the inferolateral  wall much more prominent on rest images c/w diaphragmatic attenuation, no evidence ishcemia/  normal LV function and wall motion, ef 62%  . COLONOSCOPY  last one 08-25-2011  . PROSTATE BIOPSY N/A 01/15/2017   Procedure: BIOPSY TRANSRECTAL ULTRASONIC PROSTATE (TUBP);  Surgeon: Ardis Hughs, MD;  Location: [facility];  Service: Urology;  Laterality: N/A;  . PROSTATE BIOPSY  2017  . PROSTATE BIOPSY N/A 05/18/2019   Procedure: SATURATION BIOPSY TRANSRECTAL ULTRASONIC PROSTATE  (TUBP);  Surgeon: Ardis Hughs, MD;  Location: [facility];  Service: Urology;  Laterality: N/A;  . TONSILLECTOMY  age 89    FAMILY HISTORY:  Family History  Problem Relation Age of Onset  . Breast cancer Mother   . Heart disease Mother        CHF  . Parkinsonism Mother   . Heart attack Father   . Colon polyps Father   . Heart disease Brother        post CABG  . Cancer Maternal Aunt        believe she suffered from colon ca but isn't certain  . Lung cancer Maternal Uncle        heavy smoker  . Cancer Cousin        first cousin. type unknown.  . Colon cancer Neg Hx   . Esophageal cancer Neg Hx   . Stomach cancer Neg Hx     SOCIAL HISTORY:  Social History   Socioeconomic History  . Marital status: Married    Spouse name: Not on file  . Number of children: Not on file  . Years of education: Not on file  . Highest education level: Not on file  Occupational History  . Not on file  Tobacco Use  . Smoking status: Never Smoker  . Smokeless tobacco: Never Used  Substance and Sexual Activity  . Alcohol use: Yes    Comment: 2 drink daily average---   . Drug use: No  . Sexual activity: Yes    Comment: with aid of sildenafil  Other Topics Concern  . Not on file  Social History Narrative   No caffeine    Social Determinants of Health   Financial Resource Strain:   . Difficulty of Paying Living Expenses:   Food Insecurity:   . Worried About Charity fundraiser in the Last Year:   . Arboriculturist in the Last Year:   Transportation Needs:   . Film/video editor (Medical):   [name] Kitchen Lack of Transportation (Non-Medical):   Physical Activity:   . Days of Exercise per Week:   . Minutes of Exercise per Session:   Stress:   . Feeling of Stress :   Social Connections:   . Frequency of Communication with Friends and Family:   . Frequency of Social Gatherings with Friends and Family:   . Attends Religious Services:   . Active Member of Clubs or  Organizations:   . Attends Archivist Meetings:   [name] Kitchen Marital Status:   Intimate Partner Violence:   . Fear of Current or Ex-Partner:   . Emotionally Abused:   [name] Kitchen Physically Abused:   . Sexually Abused:     ALLERGIES: Patient has no known allergies.  MEDICATIONS:  Current Outpatient Medications  Medication Sig Dispense Refill  . docusate sodium (COLACE) 250 MG capsule Take 1-3 capsules by mouth 2 (two) times daily. One cap. In am and 2 cap in pm    . hydrocortisone (ANUSOL-HC)  25 MG suppository PLACE 1 SUPPOSITORY (25 MG TOTAL) RECTALLY AS NEEDED FOR ITCHING. 12 suppository 0  . sildenafil (REVATIO) 20 MG tablet Take 2-5 tablets by mouth as needed  5   No current facility-administered medications for this encounter.    REVIEW OF SYSTEMS:  On review of systems, the patient reports that he is doing well overall. He denies any chest pain, shortness of breath, cough, fevers, chills, night sweats, unintended weight changes. He denies any bowel disturbances, and denies abdominal pain, nausea or vomiting. He denies any new musculoskeletal or joint aches or pains. His IPSS was 4, indicating mild urinary symptoms. He reports some mild post-void dribble. He notes a history of chronic hemorrhoids, which causes rectal discomfort/pressure. His SHIM was 25 with sildenafil, indicating he has well-controlled erectile dysfunction. A complete review of systems is obtained and is otherwise negative.    PHYSICAL EXAM:  Wt Readings from Last 3 Encounters:  06/20/19 149 lb (67.6 kg)  05/18/19 150 lb 3.2 oz (68.1 kg)  09/21/18 150 lb (68 kg)   Temp Readings from Last 3 Encounters:  05/18/19 98 F (36.7 C)  09/21/18 97.6 F (36.4 C) (Oral)  02/09/18 97.8 F (36.6 C) (Oral)   BP Readings from Last 3 Encounters:  05/18/19 110/82  09/21/18 110/64  02/09/18 102/64   Pulse Readings from Last 3 Encounters:  05/18/19 69  09/21/18 (!) 59  02/09/18 68   Pain Assessment Pain Score: 0-No  pain/10  Physical exam not performed in light of telephone consult visit format.   KPS = 100  100 - Normal; no complaints; no evidence of disease. 90   - Able to carry on normal activity; minor signs or symptoms of disease. 80   - Normal activity with effort; some signs or symptoms of disease. 9   - Cares for self; unable to carry on normal activity or to do active work. 60   - Requires occasional assistance, but is able to care for most of his personal needs. 50   - Requires considerable assistance and frequent medical care. 51   - Disabled; requires special care and assistance. 39   - Severely disabled; hospital admission is indicated although death not imminent. 24   - Very sick; hospital admission necessary; active supportive treatment necessary. 10   - Moribund; fatal processes progressing rapidly. 0     - Dead  Karnofsky DA, Abelmann [place], Craver LS and Burchenal [facility] [phone]) The use of the nitrogen mustards in the palliative treatment of carcinoma: with particular reference to bronchogenic carcinoma Cancer 1 [phone]  LABORATORY DATA:  Lab Results  Component Value Date   WBC 4.5 05/04/2017   HGB 14.0 05/04/2017   HCT 41.4 05/04/2017   MCV 92.5 05/04/2017   PLT 186.0 05/04/2017   Lab Results  Component Value Date   NA 139 05/04/2017   K 4.5 05/04/2017   CL 104 05/04/2017   CO2 30 05/04/2017   Lab Results  Component Value Date   ALT 12 05/04/2017   AST 14 05/04/2017   ALKPHOS 65 05/04/2017   BILITOT 0.5 05/04/2017     RADIOGRAPHY: No results found.    IMPRESSION/PLAN: This visit was conducted via Telephone to spare the patient unnecessary potential exposure in the healthcare setting during the current COVID-19 pandemic. 1. 69 y.o. gentleman with Stage T2a adenocarcinoma of the prostate with Gleason Score of 3+4, and PSA of 11.9. We discussed the patient's workup and outlined the nature of prostate cancer in this  setting. The patient's T stage, Gleason's score, and PSA  put him into the favorable intermediate risk group. Accordingly, he is eligible for a variety of potential treatment options including brachytherapy, 5.5 weeks of external radiation, or prostatectomy. We discussed the available radiation techniques, and focused on the details and logistics of delivery. We discussed and outlined the risks, benefits, short and long-term effects associated with radiotherapy and compared and contrasted these with prostatectomy. We discussed the role of SpaceOAR in reducing the rectal toxicity associated with radiotherapy.  He was encouraged to ask questions that were answered to his stated satisfaction.  At the end of the conversation, the patient is interested in moving forward with brachytherapy and use of SpaceOAR to reduce rectal toxicity from radiotherapy.  We will share our discussion with Dr. Louis Meckel and move forward with scheduling his CT [facility] planning appointment in the near future.  The patient will be contacted by Romie Jumper in our office who will be working closely with him to coordinate OR scheduling and pre and post procedure appointments.  We will contact the pharmaceutical rep to ensure that [place] is available at the time of procedure.  He will have a prostate MRI following his post-seed CT SIM to confirm appropriate distribution of the [place].   Given current concerns for patient exposure during the COVID-19 pandemic, this encounter was conducted via telephone. The patient was notified in advance and was offered a MyChart meeting to allow for face to face communication but unfortunately reported that he did not have the appropriate resources/technology to support such a visit and instead preferred to proceed with telephone consult. The patient has given verbal consent for this type of encounter. The time spent during this encounter was 60 minutes. The attendants for this meeting include Tyler Pita MD, Ashlyn Bruning PA-C, [place],  and patient, MOURAD LAMANNA. During the encounter, Tyler Pita MD, Ashlyn Bruning PA-C, and scribe, Wilburn Mylar were located at [place].  Patient, DANON PONTHIEUX was located at home.    Nicholos Johns, PA-C    Tyler Pita, MD  [facility] Direct Dial: [phone]  Fax: [phone] [email or web address]  Skype  LinkedIn  This document serves as a record of services personally performed by Tyler Pita, MD and Freeman Caldron, PA-C. It was created on their behalf by Wilburn Mylar, a trained medical scribe. The creation of this record is based on the scribe's personal observations and the provider's statements to them. This document has been checked and approved by the attending provider.

## 2019-06-20 NOTE — Progress Notes (Signed)
See progress note under physician encounter. 

## 2019-06-21 ENCOUNTER — Telehealth: Payer: Self-pay | Admitting: *Deleted

## 2019-06-21 NOTE — Telephone Encounter (Signed)
CALLED PATIENT TO ASK QUESTIONS, LVM FOR A RETURN CALL 

## 2019-06-27 ENCOUNTER — Telehealth: Payer: Self-pay | Admitting: *Deleted

## 2019-06-27 NOTE — Telephone Encounter (Signed)
RETURNED PATIENT'S PHONE CALL, SPOKE WITH PATIENT. ?

## 2019-06-29 ENCOUNTER — Telehealth: Payer: Self-pay | Admitting: *Deleted

## 2019-06-29 ENCOUNTER — Other Ambulatory Visit: Payer: Self-pay | Admitting: Urology

## 2019-06-29 NOTE — Telephone Encounter (Signed)
Called patient to inform of implant date, spoke with patient and he is aware of this procedure °

## 2019-07-04 ENCOUNTER — Encounter: Payer: Self-pay | Admitting: Medical Oncology

## 2019-07-04 NOTE — Progress Notes (Signed)
Spoke with patient to introduce myself as the prostate nurse navigator and discuss my role. I was unable to meet him 5/18, when he consutled with Dr. Tammi Klippel. He states the consult went well and he has chosen brachytherapy to treat his prostate cancer. He is scheduled for CT simulation 7/8 and surgery 7/28. He confirmed these appointments. I gave him my contact information and asked him to call me with questions or concerns. He voiced understanding.

## 2019-07-21 ENCOUNTER — Encounter: Payer: Self-pay | Admitting: Medical Oncology

## 2019-07-21 NOTE — Progress Notes (Signed)
Patient called with questions about seed implant and follow up care. Concerns and questions addressed. He is scheduled for CT simulation 7/8.

## 2019-08-09 ENCOUNTER — Telehealth: Payer: Self-pay | Admitting: *Deleted

## 2019-08-09 NOTE — Telephone Encounter (Signed)
Called patient to remind of pre-seed appts. for 08-10-19, spoke with patient and he is aware of these appts.

## 2019-08-10 ENCOUNTER — Encounter (HOSPITAL_COMMUNITY)
Admission: RE | Admit: 2019-08-10 | Discharge: 2019-08-10 | Disposition: A | Discharge status: Home / Self Care | Payer: Medicare Other | Source: Ambulatory Visit | Attending: Urology | Admitting: Urology

## 2019-08-10 ENCOUNTER — Ambulatory Visit
Admission: RE | Admit: 2019-08-10 | Discharge: 2019-08-10 | Disposition: A | Discharge status: Home / Self Care | Payer: Medicare Other | Source: Ambulatory Visit | Attending: Urology | Admitting: Urology

## 2019-08-10 ENCOUNTER — Ambulatory Visit
Admission: RE | Admit: 2019-08-10 | Discharge: 2019-08-10 | Disposition: A | Discharge status: Home / Self Care | Payer: Medicare Other | Source: Ambulatory Visit | Attending: Radiation Oncology | Admitting: Radiation Oncology

## 2019-08-10 ENCOUNTER — Ambulatory Visit (HOSPITAL_COMMUNITY)
Admission: RE | Admit: 2019-08-10 | Discharge: 2019-08-10 | Disposition: A | Discharge status: Home / Self Care | Payer: Medicare Other | Source: Ambulatory Visit | Attending: Urology | Admitting: Urology

## 2019-08-10 ENCOUNTER — Other Ambulatory Visit: Payer: Self-pay

## 2019-08-10 ENCOUNTER — Encounter: Payer: Self-pay | Admitting: Medical Oncology

## 2019-08-10 DIAGNOSIS — C61 Malignant neoplasm of prostate: Secondary | ICD-10-CM | POA: Insufficient documentation

## 2019-08-11 NOTE — Progress Notes (Signed)
°  Radiation Oncology         [phone] ________________________________  Name: John [name] MRN: [id number]  Date: 08/10/2019  DOB: 1950-09-05  SIMULATION AND TREATMENT PLANNING NOTE PUBIC ARCH STUDY  [id number] [name], MD  Ardis Hughs, MD  DIAGNOSIS: 69 y.o. gentleman with Stage T2a adenocarcinoma of the prostate with Gleason score of 3+4, and PSA of 11.9.   Oncology History  Malignant neoplasm of prostate ([place])  05/18/2019 Cancer Staging   Staging form: Prostate, AJCC 8th Edition - Clinical stage from 05/18/2019: Stage IIB (cT1c, cN0, cM0, PSA: 11.9, Grade Group: 2) - Signed by Freeman Caldron, PA-C on 06/20/2019   06/20/2019 Initial Diagnosis   Malignant neoplasm of prostate ([place])       ICD-10-CM   1. Malignant neoplasm of prostate ([place])  C61     COMPLEX SIMULATION:  The patient presented today for evaluation for possible prostate seed implant. He was brought to the radiation planning suite and placed supine on the CT couch. A 3-dimensional image study set was obtained in upload to the planning computer. There, on each axial slice, I contoured the prostate gland. Then, using three-dimensional radiation planning tools I reconstructed the prostate in view of the structures from the transperineal needle pathway to assess for possible pubic arch interference. In doing so, I did not appreciate any pubic arch interference. Also, the patient's prostate volume was estimated based on the drawn structure. The volume was 34 cc.  Given the pubic arch appearance and prostate volume, patient remains a good candidate to proceed with prostate seed implant. Today, he freely provided informed written consent to proceed.    PLAN: The patient will undergo prostate seed implant.   ________________________________  Sheral Apley. Tammi Klippel, M.D.

## 2019-08-22 ENCOUNTER — Other Ambulatory Visit: Payer: Self-pay

## 2019-08-22 ENCOUNTER — Encounter (HOSPITAL_BASED_OUTPATIENT_CLINIC_OR_DEPARTMENT_OTHER): Payer: Self-pay | Admitting: Urology

## 2019-08-22 NOTE — Progress Notes (Signed)
Spoke w/ via phone for pre-op interview--- PT Lab needs dos----  no             Lab results------ getting CBC, CMP, PT/PTT done 08-18-2019 @ 1300;  Current ekg/ cxr dated 08-10-2019 in epic/ chart COVID test ------ 08-28-2019 @ 1400 Arrive at ------- 1130 NPO after MN NO Solid Food.  Clear liquids from MN until--- 1030 then nothing by mouth Medications to take morning of surgery ----- NONE Diabetic medication ----- n/a Patient Special Instructions ----- to do one fleet enema morning of surgery Pre-Op special Istructions ----- n/a Patient verbalized understanding of instructions that were given at this phone interview. Patient denies shortness of breath, chest pain, fever, cough a this phone interview.

## 2019-08-23 ENCOUNTER — Telehealth: Payer: Self-pay | Admitting: *Deleted

## 2019-08-23 NOTE — Telephone Encounter (Signed)
CALLED PATIENT TO REMIND OF LAB AND COVID TESTING FOR 08-28-19, SPOKE WITH PATIENT AND HE IS AWARE OF THESE APPTS.

## 2019-08-28 ENCOUNTER — Other Ambulatory Visit (HOSPITAL_COMMUNITY)
Admission: RE | Admit: 2019-08-28 | Discharge: 2019-08-28 | Disposition: A | Discharge status: Home / Self Care | Payer: Medicare Other | Source: Ambulatory Visit | Attending: Urology | Admitting: Urology

## 2019-08-28 ENCOUNTER — Other Ambulatory Visit: Payer: Self-pay

## 2019-08-28 ENCOUNTER — Encounter (HOSPITAL_COMMUNITY)
Admission: RE | Admit: 2019-08-28 | Discharge: 2019-08-28 | Disposition: A | Discharge status: Home / Self Care | Payer: Medicare Other | Source: Ambulatory Visit | Attending: Urology | Admitting: Urology

## 2019-08-28 DIAGNOSIS — Z20822 Contact with and (suspected) exposure to covid-19: Secondary | ICD-10-CM | POA: Diagnosis not present

## 2019-08-28 DIAGNOSIS — Z01812 Encounter for preprocedural laboratory examination: Secondary | ICD-10-CM | POA: Diagnosis not present

## 2019-08-28 LAB — PROTIME-INR
INR: 1 (ref 0.8–1.2)
Prothrombin Time: 13.1 seconds (ref 11.4–15.2)

## 2019-08-28 LAB — COMPREHENSIVE METABOLIC PANEL
ALT: 13 U/L (ref 0–44)
AST: 16 U/L (ref 15–41)
Albumin: 4.6 g/dL (ref 3.5–5.0)
Alkaline Phosphatase: 63 U/L (ref 38–126)
Anion gap: 9 (ref 5–15)
BUN: 15 mg/dL (ref 8–23)
CO2: 28 mmol/L (ref 22–32)
Calcium: 9.7 mg/dL (ref 8.9–10.3)
Chloride: 104 mmol/L (ref 98–111)
Creatinine, Ser: 0.75 mg/dL (ref 0.61–1.24)
GFR calc Af Amer: >60 mL/min (ref >60)
GFR calc non Af Amer: >60 mL/min (ref >60)
Glucose, Bld: 79 mg/dL (ref 70–99)
Potassium: 4.2 mmol/L (ref 3.5–5.1)
Sodium: 141 mmol/L (ref 135–145)
Total Bilirubin: 1 mg/dL (ref 0.3–1.2)
Total Protein: 7.1 g/dL (ref 6.5–8.1)

## 2019-08-28 LAB — CBC
HCT: 46.2 % (ref 39.0–52.0)
Hemoglobin: 15.2 g/dL (ref 13.0–17.0)
MCH: 30.6 pg (ref 26.0–34.0)
MCHC: 32.9 g/dL (ref 30.0–36.0)
MCV: 93 fL (ref 80.0–100.0)
Platelets: 185 10*3/uL (ref 150–400)
RBC: 4.97 MIL/uL (ref 4.22–5.81)
RDW: 12.8 % (ref 11.5–15.5)
WBC: 5 10*3/uL (ref 4.0–10.5)
nRBC: 0 % (ref 0.0–0.2)

## 2019-08-28 LAB — SARS CORONAVIRUS 2 (TAT 6-24 HRS): SARS Coronavirus 2: NEGATIVE

## 2019-08-28 LAB — APTT: aPTT: 32 seconds (ref 24–36)

## 2019-08-29 ENCOUNTER — Telehealth: Payer: Self-pay | Admitting: *Deleted

## 2019-08-29 NOTE — Anesthesia Preprocedure Evaluation (Addendum)
Anesthesia Evaluation  Patient identified by MRN, date of birth, ID band Patient awake    Reviewed: NPO status   Airway Mallampati: II  TM Distance: >3 FB     Dental   Pulmonary neg pulmonary ROS,    breath sounds clear to auscultation       Cardiovascular negative cardio ROS   Rhythm:Regular Rate:Normal     Neuro/Psych negative neurological ROS     GI/Hepatic negative GI ROS, Neg liver ROS,   Endo/Other  negative endocrine ROS  Renal/GU negative Renal ROS     Musculoskeletal   Abdominal   Peds  Hematology   Anesthesia Other Findings   Reproductive/Obstetrics                            Anesthesia Physical Anesthesia Plan  ASA: III  Anesthesia Plan: General   Post-op Pain Management:    Induction: Intravenous  PONV Risk Score and Plan: 2 and Ondansetron, Dexamethasone and Midazolam  Airway Management Planned: LMA  Additional Equipment:   Intra-op Plan:   Post-operative Plan: Extubation in OR  Informed Consent: I have reviewed the patients History and Physical, chart, labs and discussed the procedure including the risks, benefits and alternatives for the proposed anesthesia with the patient or authorized representative who has indicated his/her understanding and acceptance.     Dental advisory given  Plan Discussed with: CRNA and Anesthesiologist  Anesthesia Plan Comments:        Anesthesia Quick Evaluation

## 2019-08-29 NOTE — Telephone Encounter (Signed)
Called patient to remind of procedure for 08-30-19, spoke with patient and he is aware of this procedure

## 2019-08-30 ENCOUNTER — Encounter (HOSPITAL_BASED_OUTPATIENT_CLINIC_OR_DEPARTMENT_OTHER)
Admission: RE | Disposition: A | Discharge status: Home / Self Care | Payer: Self-pay | Source: Home / Self Care | Attending: Urology

## 2019-08-30 ENCOUNTER — Ambulatory Visit (HOSPITAL_BASED_OUTPATIENT_CLINIC_OR_DEPARTMENT_OTHER)
Admission: RE | Admit: 2019-08-30 | Discharge: 2019-08-30 | Disposition: A | Discharge status: Home / Self Care | Payer: Medicare Other | Attending: Urology | Admitting: Urology

## 2019-08-30 ENCOUNTER — Ambulatory Visit (HOSPITAL_COMMUNITY): Payer: Medicare Other

## 2019-08-30 ENCOUNTER — Ambulatory Visit (HOSPITAL_BASED_OUTPATIENT_CLINIC_OR_DEPARTMENT_OTHER): Payer: Medicare Other | Admitting: Anesthesiology

## 2019-08-30 ENCOUNTER — Encounter (HOSPITAL_BASED_OUTPATIENT_CLINIC_OR_DEPARTMENT_OTHER): Payer: Self-pay | Admitting: Urology

## 2019-08-30 DIAGNOSIS — C61 Malignant neoplasm of prostate: Secondary | ICD-10-CM | POA: Insufficient documentation

## 2019-08-30 DIAGNOSIS — N529 Male erectile dysfunction, unspecified: Secondary | ICD-10-CM | POA: Insufficient documentation

## 2019-08-30 DIAGNOSIS — Z79899 Other long term (current) drug therapy: Secondary | ICD-10-CM | POA: Insufficient documentation

## 2019-08-30 DIAGNOSIS — K648 Other hemorrhoids: Secondary | ICD-10-CM | POA: Diagnosis not present

## 2019-08-30 HISTORY — DX: Male erectile dysfunction, unspecified: N52.9

## 2019-08-30 HISTORY — PX: SPACE OAR INSTILLATION: SHX6769

## 2019-08-30 HISTORY — DX: Benign prostatic hyperplasia with lower urinary tract symptoms: N40.1

## 2019-08-30 HISTORY — PX: CYSTOSCOPY: SHX5120

## 2019-08-30 HISTORY — PX: RADIOACTIVE SEED IMPLANT: SHX5150

## 2019-08-30 SURGERY — INSERTION, RADIATION SOURCE, PROSTATE
Anesthesia: General | Site: Rectum

## 2019-08-30 MED ORDER — PROPOFOL 10 MG/ML IV BOLUS
INTRAVENOUS | Status: AC
  Filled 2019-08-30: qty 20

## 2019-08-30 MED ORDER — FLEET ENEMA 7-19 GM/118ML RE ENEM: 1.0000 | ENEMA | Freq: Once | RECTAL | Status: DC

## 2019-08-30 MED ORDER — LIDOCAINE 2% (20 MG/ML) 5 ML SYRINGE
INTRAMUSCULAR | Status: AC
  Filled 2019-08-30: qty 5

## 2019-08-30 MED ORDER — LACTATED RINGERS IV SOLN: INTRAVENOUS | Status: DC

## 2019-08-30 MED ORDER — EPHEDRINE SULFATE 50 MG/ML IJ SOLN
INTRAMUSCULAR | Status: DC | PRN
  Administered 2019-08-30 (×5): 10 mg via INTRAVENOUS

## 2019-08-30 MED ORDER — PROPOFOL 10 MG/ML IV BOLUS
INTRAVENOUS | Status: DC | PRN
  Administered 2019-08-30: 200 mg via INTRAVENOUS

## 2019-08-30 MED ORDER — FENTANYL CITRATE (PF) 100 MCG/2ML IJ SOLN
INTRAMUSCULAR | Status: AC
  Filled 2019-08-30: qty 2

## 2019-08-30 MED ORDER — PHENAZOPYRIDINE HCL 200 MG PO TABS
200.0000 mg | ORAL_TABLET | Freq: Three times a day (TID) | ORAL | 0 refills | Status: DC | PRN
Start: 2019-08-30 — End: 2021-09-03

## 2019-08-30 MED ORDER — HYDROMORPHONE HCL 1 MG/ML IJ SOLN
0.2500 mg | INTRAMUSCULAR | Status: DC | PRN
  Administered 2019-08-30 (×2): 0.25 mg via INTRAVENOUS

## 2019-08-30 MED ORDER — TAMSULOSIN HCL 0.4 MG PO CAPS: 0.4000 mg | ORAL_CAPSULE | Freq: Every day | ORAL | 2 refills | Status: DC

## 2019-08-30 MED ORDER — HYDROMORPHONE HCL 1 MG/ML IJ SOLN
INTRAMUSCULAR | Status: AC
  Filled 2019-08-30: qty 1

## 2019-08-30 MED ORDER — LIDOCAINE HCL (CARDIAC) PF 100 MG/5ML IV SOSY
PREFILLED_SYRINGE | INTRAVENOUS | Status: DC | PRN
  Administered 2019-08-30: 100 mg via INTRAVENOUS

## 2019-08-30 MED ORDER — ONDANSETRON HCL 4 MG/2ML IJ SOLN
INTRAMUSCULAR | Status: DC | PRN
  Administered 2019-08-30: 4 mg via INTRAVENOUS

## 2019-08-30 MED ORDER — FENTANYL CITRATE (PF) 100 MCG/2ML IJ SOLN: 25.0000 ug | INTRAMUSCULAR | Status: DC | PRN

## 2019-08-30 MED ORDER — FENTANYL CITRATE (PF) 100 MCG/2ML IJ SOLN
INTRAMUSCULAR | Status: DC | PRN
  Administered 2019-08-30 (×3): 25 ug via INTRAVENOUS

## 2019-08-30 MED ORDER — IOHEXOL 300 MG/ML  SOLN
INTRAMUSCULAR | Status: DC | PRN
  Administered 2019-08-30: 7 mL

## 2019-08-30 MED ORDER — SODIUM CHLORIDE (PF) 0.9 % IJ SOLN
INTRAMUSCULAR | Status: DC | PRN
  Administered 2019-08-30: 10 mL

## 2019-08-30 MED ORDER — ONDANSETRON HCL 4 MG/2ML IJ SOLN
INTRAMUSCULAR | Status: AC
  Filled 2019-08-30: qty 2

## 2019-08-30 MED ORDER — DEXAMETHASONE SODIUM PHOSPHATE 10 MG/ML IJ SOLN
INTRAMUSCULAR | Status: AC
  Filled 2019-08-30: qty 1

## 2019-08-30 MED ORDER — CIPROFLOXACIN IN D5W 400 MG/200ML IV SOLN
INTRAVENOUS | Status: AC
  Filled 2019-08-30: qty 200

## 2019-08-30 MED ORDER — CIPROFLOXACIN IN D5W 400 MG/200ML IV SOLN
400.0000 mg | INTRAVENOUS | Status: AC
  Administered 2019-08-30: 400 mg via INTRAVENOUS

## 2019-08-30 MED ORDER — TRAMADOL HCL 50 MG PO TABS: 50.0000 mg | ORAL_TABLET | Freq: Four times a day (QID) | ORAL | 0 refills | Status: DC | PRN

## 2019-08-30 MED ORDER — DEXAMETHASONE SODIUM PHOSPHATE 10 MG/ML IJ SOLN
INTRAMUSCULAR | Status: DC | PRN
  Administered 2019-08-30: 10 mg via INTRAVENOUS

## 2019-08-30 MED ORDER — SODIUM CHLORIDE 0.9 % IR SOLN
Status: DC | PRN
  Administered 2019-08-30: 1000 mL

## 2019-08-30 MED FILL — TAMSULOSIN HCL 0.4 MG CAP: 0.4 | 30 days supply | Qty: 30 | Fill #0

## 2019-08-30 MED FILL — PHENAZOPYRIDINE 200 MG TAB: 200 | 3 days supply | Qty: 10 | Fill #0

## 2019-08-30 MED FILL — traMADol HCL 50 MG TABS: 50 | 2 days supply | Qty: 15 | Fill #0

## 2019-08-30 SURGICAL SUPPLY — 36 items
BAG DRN RND TRDRP ANRFLXCHMBR (UROLOGICAL SUPPLIES) ×3
BAG URINE DRAIN 2000ML AR STRL (UROLOGICAL SUPPLIES) ×5 IMPLANT
BLADE CLIPPER SENSICLIP SURGIC (BLADE) ×5 IMPLANT
CATH FOLEY 2WAY SLVR  5CC 16FR (CATHETERS) ×5
CATH FOLEY 2WAY SLVR 5CC 16FR (CATHETERS) ×3 IMPLANT
CATH ROBINSON RED A/P 20FR (CATHETERS) ×5 IMPLANT
CLOTH BEACON ORANGE TIMEOUT ST (SAFETY) ×5 IMPLANT
CNTNR URN SCR LID CUP LEK RST (MISCELLANEOUS) ×6 IMPLANT
CONT SPEC 4OZ STRL OR WHT (MISCELLANEOUS) ×10
COVER BACK TABLE 60X90IN (DRAPES) ×5 IMPLANT
COVER MAYO STAND STRL (DRAPES) ×5 IMPLANT
DRAPE OEC MINIVIEW 54X84 (DRAPES) ×5 IMPLANT
DRSG TEGADERM 4X4.75 (GAUZE/BANDAGES/DRESSINGS) ×5 IMPLANT
DRSG TEGADERM 8X12 (GAUZE/BANDAGES/DRESSINGS) ×5 IMPLANT
GAUZE SPONGE 4X4 12PLY STRL (GAUZE/BANDAGES/DRESSINGS) ×5 IMPLANT
GLOVE BIO SURGEON STRL SZ 6.5 (GLOVE) ×8 IMPLANT
GLOVE BIO SURGEON STRL SZ7.5 (GLOVE) ×10 IMPLANT
GLOVE BIO SURGEONS STRL SZ 6.5 (GLOVE) ×2
GLOVE BIOGEL PI IND STRL 7.5 (GLOVE) ×6 IMPLANT
GLOVE BIOGEL PI INDICATOR 7.5 (GLOVE) ×4
GLOVE SURG ORTHO 8.5 STRL (GLOVE) ×10 IMPLANT
GLOVE SURG SS PI 6.5 STRL IVOR (GLOVE) ×5 IMPLANT
GOWN STRL REUS W/TWL XL LVL3 (GOWN DISPOSABLE) ×5 IMPLANT
HOLDER FOLEY CATH W/STRAP (MISCELLANEOUS) IMPLANT
I-SEED AGX100 ×365 IMPLANT
IMPL SPACEOAR VUE SYSTEM (Spacer) ×3 IMPLANT
IMPLANT SPACEOAR VUE SYSTEM (Spacer) ×5 IMPLANT
IV NS 1000ML (IV SOLUTION) ×10
IV NS 1000ML BAXH (IV SOLUTION) ×6 IMPLANT
KIT TURNOVER CYSTO (KITS) ×5 IMPLANT
MARKER SKIN DUAL TIP RULER LAB (MISCELLANEOUS) ×5 IMPLANT
PACK CYSTO (CUSTOM PROCEDURE TRAY) ×5 IMPLANT
SYR 10ML LL (SYRINGE) ×5 IMPLANT
TOWEL OR 17X26 10 PK STRL BLUE (TOWEL DISPOSABLE) ×5 IMPLANT
UNDERPAD 30X36 HEAVY ABSORB (UNDERPADS AND DIAPERS) ×10 IMPLANT
WATER STERILE IRR 500ML POUR (IV SOLUTION) ×5 IMPLANT

## 2019-08-30 NOTE — H&P (Signed)
Pt presents today for pre-operative history and physical exam in anticipation of brachytherapy and space oar placement on 08/30/19 by Dr. Louis Meckel. He is doing well and is without complaint.   Pt denies F/C, HA, CP, SOB, N/V, diarrhea/constipation, back pain, flank pain, hematuria, and dysuria.    HX:   The patient presents today for follow-up of his most recent prostate biopsy. He underwent a saturation biopsy in the operating room.   The patient has tolerated the procedure well without any untoward events or significant urinary tract symptoms. His bleeding has stopped.   The patient had a history of Gleason 3+4=7, albeit low volume. He had an MRI demonstrating a prior at 4 lesion in the left anterior apex, which we sampled several years ago. This showed 1 core of Gleason 3 + 4. A repeat biopsy demonstrated similar results. However, his PSA continued to rise and as such I recommended that we perform a saturation biopsy. I did a cognitive fusion biopsy in the operating room under general anesthesia. I took 31 cores. Six cores came back positive for Gleason 3+4=7. They were predominantly located in the patient's left anterior apex/mid gland.   The patient has minimal voiding symptoms. He has mild erectile dysfunction.   The patient is otherwise healthy.     ALLERGIES: No Allergies    MEDICATIONS: Docusate Sodium 100 mg tablet Oral  Hydrocortisone Acetate 25 mg suppository, rectal Rectal  Sildenafil Citrate 20 mg tablet 2-5 tablets as needed prior to intimacy  Triamcinolone Acetonide 0.1 % cream External     GU PSH: Prostate Needle Biopsy - 2018, 2017 Prostate Saturation Sampling - 05/18/2019       PSH Notes: Complete Colonoscopy, Tonsillectomy   NON-GU PSH: Diagnostic Colonoscopy - 2011 Remove Tonsils - 2011 Surgical Pathology, Gross And Microscopic Examination For Prostate Needle - 2017     GU PMH: Post-void dribbling - 08/12/2018 Prostate Cancer - 08/12/2018, - 2020, - 2019, -  2018, - 2018, - 2018, The patient is on active surveillance for his prostate cancer. His PSA did rise over the last 6 months which is concerning but at this point we will continue to monitor closely., - 2018, - 2017 Elevated PSA, Elevated prostate specific antigen (PSA) - 2017 BPH w/LUTS, Benign localized prostatic hyperplasia with lower urinary tract symptoms (LUTS) - 2017 ED due to arterial insufficiency, Erectile dysfunction due to arterial insufficiency - 2017 BPH w/o LUTS, BPH without urinary obstruction - 2016 Hematospermia, Hematospermia - 2016    NON-GU PMH: Encounter for general adult medical examination without abnormal findings, Encounter for preventive health examination - 2017 Personal history of other infectious and parasitic diseases, History of hepatitis - 2014    FAMILY HISTORY: Breast Cancer - Runs In Family Family Health Status Number - Runs In Family Father Deceased At Age35 ___ - Runs In Family Mother Deceased At Age 64 from diabetic complicati - Runs In Family No pertinent family history - Other   SOCIAL HISTORY: Marital Status: Married Preferred Language: English; Ethnicity: Not Hispanic Or Latino; Race: White Current Smoking Status: Patient has never smoked.   Tobacco Use Assessment Completed: Used Tobacco in last 30 days? Does not use smokeless tobacco. Does drink.  Does not use drugs. Drinks 1 caffeinated drink per day. Has not had a blood transfusion.     Notes: Never A Smoker, Caffeine Use, Marital History - Currently Married, Alcohol Use, Tobacco Use  1 beer per day    REVIEW OF SYSTEMS:    GU Review Male:  Patient denies frequent urination, hard to postpone urination, burning/ pain with urination, get up at night to urinate, leakage of urine, stream starts and stops, trouble starting your stream, have to strain to urinate , erection problems, and penile pain.  Gastrointestinal (Upper):   Patient denies nausea, vomiting, and indigestion/ heartburn.   Gastrointestinal (Lower):   Patient denies diarrhea and constipation.  Constitutional:   Patient denies fever, night sweats, weight loss, and fatigue.  Skin:   Patient denies skin rash/ lesion and itching.  Eyes:   Patient denies blurred vision and double vision.  Ears/ Nose/ Throat:   Patient denies sinus problems and sore throat.  Hematologic/Lymphatic:   Patient denies swollen glands and easy bruising.  Cardiovascular:   Patient denies leg swelling and chest pains.  Respiratory:   Patient denies cough and shortness of breath.  Endocrine:   Patient denies excessive thirst.  Musculoskeletal:   Patient denies back pain and joint pain.  Neurological:   Patient denies headaches and dizziness.  Psychologic:   Patient denies depression and anxiety.   VITAL SIGNS:      08/15/2019 02:22 PM  Weight 149 lb / 67.59 kg  BP 116/75 mmHg  Pulse 66 /min  Temperature 97.8 F / 36.5 C   MULTI-SYSTEM PHYSICAL EXAMINATION:    Constitutional: Well-nourished. No physical deformities. Normally developed. Good grooming.  Neck: Neck symmetrical, not swollen. Normal tracheal position.  Respiratory: Normal breath sounds. No labored breathing, no use of accessory muscles.   Cardiovascular: Regular rate and rhythm. No murmur, no gallop. Normal temperature, normal extremity pulses, no swelling, no varicosities.   Lymphatic: No enlargement of neck, axillae, groin.  Skin: No paleness, no jaundice, no cyanosis. No lesion, no ulcer, no rash.  Neurologic / Psychiatric: Oriented to time, oriented to place, oriented to person. No depression, no anxiety, no agitation.  Gastrointestinal: No mass, no tenderness, no rigidity, non obese abdomen.  Eyes: Normal conjunctivae. Normal eyelids.  Ears, Nose, Mouth, and Throat: Left ear no scars, no lesions, no masses. Right ear no scars, no lesions, no masses. Nose no scars, no lesions, no masses. Normal hearing. Normal lips.  Musculoskeletal: Normal gait and station of head and  neck.     Complexity of Data:  Records Review:   Previous Patient Records  Urine Test Review:   Urinalysis   08/15/19  Urinalysis  Urine Appearance Clear   Urine Color Straw   Urine Glucose Neg mg/dL  Urine Bilirubin Neg mg/dL  Urine Ketones Neg mg/dL  Urine Specific Gravity 1.010   Urine Blood Neg ery/uL  Urine pH <=5.0   Urine Protein Neg mg/dL  Urine Urobilinogen 0.2 mg/dL  Urine Nitrites Neg   Urine Leukocyte Esterase Neg leu/uL   PROCEDURES:          Urinalysis - 81003 Dipstick Dipstick Cont'd  Color: Straw Bilirubin: Neg mg/dL  Appearance: Clear Ketones: Neg mg/dL  Specific Gravity: 1.010 Blood: Neg ery/uL  pH: <=5.0 Protein: Neg mg/dL  Glucose: Neg mg/dL Urobilinogen: 0.2 mg/dL    Nitrites: Neg    Leukocyte Esterase: Neg leu/uL    ASSESSMENT:      ICD-10 Details  1 GU:   Prostate Cancer - C61    PLAN:           Schedule Return Visit/Planned Activity: Keep Scheduled Appointment - Schedule Surgery          Document Letter(s):  Created for Patient: Clinical Summary         Notes:  There are no changes in the patients history or physical exam since last evaluation by Dr. Louis Meckel. Pt is scheduled to undergo brachytherapy and space oar placement on 08/30/19.

## 2019-08-30 NOTE — Transfer of Care (Signed)
Immediate Anesthesia Transfer of Care Note  Patient: SERAPIO EDELSON  Procedure(s) Performed: Procedure(s) (LRB): RADIOACTIVE SEED IMPLANT/BRACHYTHERAPY IMPLANT (N/A) SPACE OAR INSTILLATION (N/A) CYSTOSCOPY FLEXIBLE (N/A)  Patient Location: PACU  Anesthesia Type: General  Level of Consciousness: awake, oriented, sedated and patient cooperative  Airway & Oxygen Therapy: Patient Spontanous Breathing and Patient connected to face mask oxygen  Post-op Assessment: Report given to PACU RN and Post -op Vital signs reviewed and stable  Post vital signs: Reviewed and stable  Complications: No apparent anesthesia complications Last Vitals:  Vitals Value Taken Time  BP 136/85 08/30/19 1515  Temp 36.6 C 08/30/19 1515  Pulse 73 08/30/19 1519  Resp 11 08/30/19 1519  SpO2 100 % 08/30/19 1519  Vitals shown include unvalidated device data.  Last Pain:  Vitals:   08/30/19 1515  TempSrc:   PainSc: 5       Patients Stated Pain Goal: 3 (25/63/89 3734)  Complications: No complications documented.

## 2019-08-30 NOTE — Discharge Instructions (Signed)
DISCHARGE INSTRUCTIONS FOR PROSTATE SEED IMPLANTATION   Antibiotics You may be given a prescription for an antibiotic to take when you arrive home. If so, be sure to take every tablet in the bottle, even if you are feeling better before the prescription is finished. If you begin itching, notice a rash or start to swell on your trunk, arms, legs and/or throat, immediately stop taking the antibiotic and call your Urologist. Diet Resume your usual diet when you return home. To keep your bowels moving easily and softly, drink prune, apple and cranberry juice at room temperature. You may also take a stool softener, such as Colace, which is available without prescription at local pharmacies. Daily activities No driving or heavy lifting for at least two days after the implant. No bike riding, horseback riding or riding lawn mowers for the first month after the implant. Any strenuous physical activity should be approved by your doctor before you resume it. Sexual relations You may resume sexual relations two weeks after the procedure. A condom should be used for the first two weeks. Your semen may be dark brown or black; this is normal and is related bleeding that may have occurred during the implant. Postoperative swelling Expect swelling and bruising of the scrotum and perineum (the area between the scrotum and anus). Both the swelling and the bruising should resolve in l or 2 weeks. Ice packs and over- the-counter medications such as Tylenol, Advil or Aleve may lessen your discomfort. Postoperative urination Most men experience burning on urination and/or urinary frequency. If this becomes bothersome, contact your Urologist.  Medication can be prescribed to relieve these problems.  It is normal to have some blood in your urine for a few days after the implant. Special instructions related to the seeds It is unlikely that you will pass an Iodine-125 seed in your urine. The seeds are silver in color and  are about as large as a grain of rice. If you pass a seed, do not handle it with your fingers. Use a spoon to place it in an envelope or jar in place this in base occluded area such as the garage or basement for return to the radiation clinic at your convenience.  Contact your doctor for Temperature greater than 101 F Increasing pain Inability to urinate Follow-up  You should have follow up with your urologist and radiation oncologist about 3 weeks after the procedure. General information regarding Iodine seeds Iodine-125 is a low energy radioactive material. It is not deeply penetrating and loses energy at short distances. Your prostate will absorb the radiation. Objects that are touched or used by the patient do not become radioactive. Body wastes (urine and stool) or body fluids (saliva, tears, semen or blood) are not radioactive. The Nuclear Regulatory Commission (NRC) has determined that no radiation precautions are needed for patients undergoing Iodine-125 seed implantation. The NRC states that such patients do not present a risk to the people around them, including young children and pregnant women. However, in keeping with the general principle that radiation exposure should be kept as low reasonably possible, we suggest the following: Children and pets should not sit on the patient's lap for the first two (2) weeks after the implant. Pregnant (or possibly pregnant) women should avoid prolonged, close contact with the patient for the first two (2) weeks after the implant. A distance of three (3) feet is acceptable. At a distance of three (3) feet, there is no limit to the length of time anyone can   with the patient.     Post Anesthesia Home Care Instructions  Activity: Get plenty of rest for the remainder of the day. A responsible individual must stay with you for 24 hours following the procedure.  For the next 24 hours, DO NOT: -Drive a car -Operate machinery -Drink alcoholic  beverages -Take any medication unless instructed by your physician -Make any legal decisions or sign important papers.  Meals: Start with liquid foods such as gelatin or soup. Progress to regular foods as tolerated. Avoid greasy, spicy, heavy foods. If nausea and/or vomiting occur, drink only clear liquids until the nausea and/or vomiting subsides. Call your physician if vomiting continues.  Special Instructions/Symptoms: Your throat may feel dry or sore from the anesthesia or the breathing tube placed in your throat during surgery. If this causes discomfort, gargle with warm salt water. The discomfort should disappear within 24 hours.      

## 2019-08-30 NOTE — Anesthesia Procedure Notes (Signed)
Procedure Name: LMA Insertion Date/Time: 08/30/2019 2:00 PM Performed by: Justice Rocher, CRNA Pre-anesthesia Checklist: Patient identified, Emergency Drugs available, Suction available, Patient being monitored and Timeout performed Patient Re-evaluated:Patient Re-evaluated prior to induction Oxygen Delivery Method: Circle system utilized Preoxygenation: Pre-oxygenation with 100% oxygen Induction Type: IV induction Ventilation: Mask ventilation without difficulty LMA: LMA inserted LMA Size: 4.0 Number of attempts: 1 Airway Equipment and Method: Bite block Placement Confirmation: positive ETCO2,  breath sounds checked- equal and bilateral and CO2 detector Tube secured with: Tape Dental Injury: Teeth and Oropharynx as per pre-operative assessment

## 2019-08-30 NOTE — Anesthesia Postprocedure Evaluation (Signed)
Anesthesia Post Note  Patient: John [name]  Procedure(s) Performed: RADIOACTIVE SEED IMPLANT/BRACHYTHERAPY IMPLANT (N/A Prostate) SPACE OAR INSTILLATION (N/A Rectum) CYSTOSCOPY FLEXIBLE (N/A Bladder)     Patient location during evaluation: PACU Anesthesia Type: General Level of consciousness: awake Pain management: pain level controlled Vital Signs Assessment: post-procedure vital signs reviewed and stable Respiratory status: spontaneous breathing Cardiovascular status: stable Postop Assessment: no apparent nausea or vomiting Anesthetic complications: no   No complications documented.  Last Vitals:  Vitals:   08/30/19 1545 08/30/19 1552  BP: (!) 141/84 (!) 144/88  Pulse: 74 72  Resp: 12 (!) 10  Temp:    SpO2: 94% 98%    Last Pain:  Vitals:   08/30/19 1545  TempSrc:   PainSc: 4                  Mertie Haslem

## 2019-08-30 NOTE — Progress Notes (Signed)
  Radiation Oncology         [phone] ________________________________  Name: JAMARIE MUSSA MRN: [id number]  Date: 08/30/2019  DOB: Jun 13, 1950       Prostate Seed Implant  FV:[id number] [name], MD  No ref. provider found  DIAGNOSIS: 69 y.o. gentleman with Stage T2a adenocarcinoma of the prostate with Gleason score of 3+4, and PSA of 11.9.  Oncology History  Malignant neoplasm of prostate ([place])  05/18/2019 Cancer Staging   Staging form: Prostate, AJCC 8th Edition - Clinical stage from 05/18/2019: Stage IIB (cT1c, cN0, cM0, PSA: 11.9, Grade Group: 2) - Signed by Freeman Caldron, PA-C on 06/20/2019   06/20/2019 Initial Diagnosis   Malignant neoplasm of prostate ([place])       ICD-10-CM   1. Malignant neoplasm of prostate [facility])  [place] Discharge patient    PROCEDURE: Insertion of radioactive I-125 seeds into the prostate gland.  RADIATION DOSE: 145 Gy, definitive therapy.  TECHNIQUE: BRENTLY VOORHIS was brought to the operating room with the urologist. He was placed in the dorsolithotomy position. He was catheterized and a rectal tube was inserted. The perineum was shaved, prepped and draped. The ultrasound probe was then introduced into the rectum to see the prostate gland.  TREATMENT DEVICE: A needle grid was attached to the ultrasound probe stand and anchor needles were placed.  3D PLANNING: The prostate was imaged in 3D using a sagittal sweep of the prostate probe. These images were transferred to the planning computer. There, the prostate, urethra and rectum were defined on each axial reconstructed image. Then, the software created an optimized 3D plan and a few seed positions were adjusted. The quality of the plan was reviewed using [facility] information for the target and the following two organs at risk:  Urethra and Rectum.  Then the accepted plan was printed and handed off to the radiation therapist.  Under my supervision, the custom loading of the seeds and spacers was  carried out and loaded into sealed vicryl sleeves.  These pre-loaded needles were then placed into the needle holder.[name] Kitchen  PROSTATE VOLUME STUDY:  Using transrectal ultrasound the volume of the prostate was verified to be 39.8 cc.  SPECIAL TREATMENT PROCEDURE/SUPERVISION AND HANDLING: The pre-loaded needles were then delivered under sagittal guidance. A total of 23 needles were used to deposit 73 seeds in the prostate gland. The individual seed activity was 0.419 mCi.  SpaceOAR:  Yes, VUE  COMPLEX SIMULATION: At the end of the procedure, an anterior radiograph of the pelvis was obtained to document seed positioning and count. Cystoscopy was performed to check the urethra and bladder.  MICRODOSIMETRY: At the end of the procedure, the patient was emitting 0.151 mR/hr at 1 meter. Accordingly, he was considered safe for hospital discharge.  PLAN: The patient will return to the radiation oncology clinic for post implant CT dosimetry in three weeks.   ________________________________  Sheral Apley Tammi Klippel, M.D.

## 2019-08-30 NOTE — Op Note (Signed)
Preoperative diagnosis:  Clinical stage TI C adenocarcinoma the prostate  Postoperative diagnosis:  Same  Procedure:  #1 I-125 prostate seed implantation  #2 cystourethroscopy #3 instillation of SpaceOAR biogel  Surgeon: Louis Meckel, M.D. Radiation Oncologist: Tyler Pita, M.D.  Anesthesia: Gen.   Indications: Patient  was diagnosed with clinical stage TI C prostate cancer. We had extensive discussion with him about treatment options versus. He elected to proceed with seed implantation. He underwent consultation my office as well as with Dr. Tyler Pita. He appeared to understand the advantages disadvantages potential risks of this treatment option. Full informed consent has been obtained. The patient is had preoperative ciprofloxacin. PAS compression boots were placed.   Technique and findings: Patient was brought the operating room where he had  successful induction of general anesthesia. He was placed in lithotomy position and prepped and draped in usual manner. Appropriate surgical timeout was performed. Radiation oncology department placed a transrectal ultrasound probe anchoring stand. Foley catheter with contrast in the balloon was inserted without difficulty. Anchoring needles were placed within the prostate.  Real-time contouring of the urethra prostate and rectum were performed and the dosing parameters were established. Targeted dose was 145 gray. We then came to the operating suite suite for placement of the needles. A second timeout was performed. All needle passage was done with real-time transrectal ultrasound guidance with the sagittal plane. A total of 22 needles were placed.  72 active seeds were implanted.  The brachytherapy template was then removed.   A site in the midline was selected on the perineum for placement of an 18 g needle with saline.  The needle was advanced above the rectum and below Denonvillier's fascia to the mid gland and confirmed to be in the midline  on transverse imaging.  One cc of saline was injected confirming appropriate expansion of this space.  A total of 5 cc of saline was then injected to open the space further bilaterally.  The saline syringe was then removed and the SpaceOAR hydrogel was injected with good distribution bilaterally.A Foley catheter was removed and flexible cystoscopy failed to show any seeds outside the prostate. The patient was brought to recovery room in stable condition.

## 2019-08-30 NOTE — Interval H&P Note (Signed)
History and Physical Interval Note:  08/30/2019 1:32 PM  John [name]  has presented today for surgery, with the diagnosis of PROSTATE CANCER.  The various methods of treatment have been discussed with the patient and family. After consideration of risks, benefits and other options for treatment, the patient has consented to  Procedure(s) with comments: RADIOACTIVE SEED IMPLANT/BRACHYTHERAPY IMPLANT (N/A) -        seeds implanted SPACE OAR INSTILLATION (N/A) CYSTOSCOPY FLEXIBLE (N/A) - no seeds found in bladder as a surgical intervention.  The patient's history has been reviewed, patient examined, no change in status, stable for surgery.  I have reviewed the patient's chart and labs.  Questions were answered to the patient's satisfaction.     Ardis Hughs

## 2019-08-31 ENCOUNTER — Encounter: Payer: Self-pay | Admitting: Medical Oncology

## 2019-09-04 ENCOUNTER — Encounter (HOSPITAL_BASED_OUTPATIENT_CLINIC_OR_DEPARTMENT_OTHER): Payer: Self-pay | Admitting: Urology

## 2019-09-18 ENCOUNTER — Telehealth: Payer: Self-pay | Admitting: *Deleted

## 2019-09-18 NOTE — Telephone Encounter (Signed)
RETURNED PATIENT'S PHONE CALL, SPOKE WITH PATIENT. ?

## 2019-09-20 ENCOUNTER — Telehealth: Payer: Self-pay | Admitting: *Deleted

## 2019-09-20 NOTE — Telephone Encounter (Signed)
CALLED PATIENT TO INFORM THAT IT IS O.K. THAT HIS POST SEED APPTS. HAVE BEEN MOVED TO 10-11-19 DUE TO BEING EXPOSED TO COVID PER ASHLYN BRUNING, PATIENT VERIFIED UNDERSTANDING THIS

## 2019-09-21 ENCOUNTER — Ambulatory Visit: Payer: Self-pay | Admitting: Urology

## 2019-09-21 ENCOUNTER — Ambulatory Visit: Payer: Medicare Other | Admitting: Radiation Oncology

## 2019-10-02 DIAGNOSIS — C61 Malignant neoplasm of prostate: Secondary | ICD-10-CM | POA: Diagnosis not present

## 2019-10-05 ENCOUNTER — Telehealth: Payer: Self-pay | Admitting: *Deleted

## 2019-10-05 NOTE — Telephone Encounter (Signed)
CALLED PATIENT TO ASK ABOUT ALTERNATING POST SEED APPTS. FOR 10-11-19, PATIENT AGREED TO DO SO

## 2019-10-10 ENCOUNTER — Telehealth: Payer: Self-pay | Admitting: *Deleted

## 2019-10-10 NOTE — Telephone Encounter (Signed)
Called patient to remind of post seed appts. for 10-11-19, spoke with patient and he is aware of these appts.

## 2019-10-11 ENCOUNTER — Other Ambulatory Visit: Payer: Self-pay

## 2019-10-11 ENCOUNTER — Ambulatory Visit
Admission: RE | Admit: 2019-10-11 | Discharge: 2019-10-11 | Disposition: A | Discharge status: Home / Self Care | Payer: Medicare Other | Source: Ambulatory Visit | Attending: Urology | Admitting: Urology

## 2019-10-11 ENCOUNTER — Ambulatory Visit
Admission: RE | Admit: 2019-10-11 | Discharge: 2019-10-11 | Disposition: A | Discharge status: Home / Self Care | Payer: Medicare Other | Source: Ambulatory Visit | Attending: Radiation Oncology | Admitting: Radiation Oncology

## 2019-10-11 ENCOUNTER — Encounter: Payer: Self-pay | Admitting: Urology

## 2019-10-11 VITALS — BP 113/78 | HR 60 | Temp 98.2°F | Resp 18 | Ht 69.0 in | Wt 153.6 lb

## 2019-10-11 DIAGNOSIS — C61 Malignant neoplasm of prostate: Secondary | ICD-10-CM | POA: Diagnosis not present

## 2019-10-11 DIAGNOSIS — R3911 Hesitancy of micturition: Secondary | ICD-10-CM | POA: Insufficient documentation

## 2019-10-11 DIAGNOSIS — R350 Frequency of micturition: Secondary | ICD-10-CM | POA: Diagnosis not present

## 2019-10-11 DIAGNOSIS — Z79899 Other long term (current) drug therapy: Secondary | ICD-10-CM | POA: Insufficient documentation

## 2019-10-11 NOTE — Progress Notes (Signed)
Radiation Oncology         (336) [phone] ________________________________  Name: KEWAN MCNEASE MRN: [id number]  Date: 10/11/2019  DOB: 1950/05/22  Post-Seed Follow-Up Visit Note  CC: Isaac Bliss, Rayford Halsted, MD  Ardis Hughs, MD  Diagnosis:   69 y.o. gentleman with Stage T2a adenocarcinoma of the prostate with Gleason score of 3+4, and PSA of 11.9.    ICD-10-CM   1. Malignant neoplasm of prostate (HCC)  C61     Interval Since Last Radiation:  6 weeks 08/30/2019:  Insertion of radioactive I-125 seeds into the prostate gland; 145 Gy, definitive therapy with placement of SpaceOAR VUE gel.  Narrative:  The patient returns today for routine follow-up.  He is complaining of increased urinary frequency and urinary hesitation symptoms. He filled out a questionnaire regarding urinary function today providing and overall IPSS score of 16 characterizing his symptoms as moderate with weak stream, intermittency and urgency. His pre-implant score was 4.  He feels like his LUTS are gradually improving.  He has continued taking tamsulosin daily as prescribed.  He denies any abdominal pain or bowel symptoms.  ALLERGIES:  has No Known Allergies.  Meds: Current Outpatient Medications  Medication Sig Dispense Refill  . docusate sodium (COLACE) 250 MG capsule Take 1-3 capsules by mouth 2 (two) times daily. One cap. In am and 2 cap in pm    . EQ HEMORRHOIDAL MAX ST 1-0.25-14.4-15 % CREA Apply topically daily. [employer] equate    . hydrocortisone (ANUSOL-HC) 25 MG suppository PLACE 1 SUPPOSITORY (25 MG TOTAL) RECTALLY AS NEEDED FOR ITCHING. (Patient not taking: Reported on 08/22/2019) 12 suppository 0  . phenazopyridine (PYRIDIUM) 200 MG tablet Take 1 tablet (200 mg total) by mouth 3 (three) times daily as needed for pain. 10 tablet 0  . sildenafil (REVATIO) 20 MG tablet Take 2-5 tablets by mouth as needed  5  . tamsulosin (FLOMAX) 0.4 MG CAPS capsule Take 1 capsule (0.4 mg total) by mouth  daily. 30 capsule 2  . traMADol (ULTRAM) 50 MG tablet Take 1-2 tablets (50-100 mg total) by mouth every 6 (six) hours as needed for moderate pain. 15 tablet 0   No current facility-administered medications for this visit.    Physical Findings: In general this is a well appearing Caucasian male in no acute distress. He's alert and oriented x4 and appropriate throughout the examination. Cardiopulmonary assessment is negative for acute distress and he exhibits normal effort.   Lab Findings: Lab Results  Component Value Date   WBC 5.0 08/28/2019   HGB 15.2 08/28/2019   HCT 46.2 08/28/2019   MCV 93.0 08/28/2019   PLT 185 08/28/2019    Radiographic Findings:  Patient underwent CT imaging in our clinic for post implant dosimetry. The CT will be reviewed by Dr. Tammi Klippel to confirm there is an adequate distribution of radioactive seeds throughout the prostate gland and ensure that there are no seeds in or near the rectum. We suspect the final radiation plan and dosimetry will show appropriate coverage of the prostate gland. He understands that we will call and inform him of any unexpected findings on further review of his imaging and dosimetry.  Impression/Plan: 69 y.o. gentleman with Stage T2a adenocarcinoma of the prostate with Gleason score of 3+4, and PSA of 11.9. The patient is recovering from the effects of radiation. His urinary symptoms should gradually improve over the next 4-6 months. We talked about this today. He is encouraged by his improvement already and is otherwise  pleased with his outcome. We also talked about long-term follow-up for prostate cancer following seed implant. He understands that ongoing PSA determinations and digital rectal exams will help perform surveillance to rule out disease recurrence. He saw Jiles Crocker, NP in the urology office last week and has a follow up appointment scheduled with Dr. Louis Meckel in 3 months. He understands what to expect with his PSA measures.  Patient was also educated today about some of the long-term effects from radiation including a small risk for rectal bleeding and possibly erectile dysfunction. We talked about some of the general management approaches to these potential complications. However, I did encourage the patient to contact our office or return at any point if he has questions or concerns related to his previous radiation and prostate cancer.  Today, a comprehensive survivorship care plan and treatment summary was reviewed with the patient today detailing his prostate cancer diagnosis, treatment course, potential late/long-term effects of treatment, appropriate follow-up care with recommendations for the future, and patient education resources.  A copy of this summary, along with a letter will be sent to the patient's primary care provider via fax after today's visit.  2. Cancer screening:  Due to Mr. Syverson's history and his age, he should receive screening for skin cancers and colon cancer.  The information and recommendations are listed on the patient's comprehensive care plan/treatment summary and were reviewed in detail with the patient.     3. Health maintenance and wellness promotion: Mr. Vavra was encouraged to consume 5-7 servings of fruits and vegetables per day. He was provided a copy of the "Nutrition Rainbow" handout, as well as the handout "Take Control of Your Health and [place]" from the [facility].  He was also encouraged to engage in moderate to vigorous exercise for 30 minutes per day most days of the week. Information was provided regarding the [facility] fitness program, which is designed for cancer survivors to help them become more physically fit after cancer treatments. We discussed that a healthy BMI is 18.5-24.9 and that maintaining a healthy weight reduces risk of cancer recurrences.  He was instructed to limit his alcohol consumption and continue to abstain from tobacco  use.  Lastly, he was encouraged to use sunscreen and wear protective clothing when in the sun.     4. Support services/counseling: It is not uncommon for this period of the patient's cancer care trajectory to be one of many emotions and stressors.  Mr. Cuny was encouraged to take advantage of our many support services programs, support groups, and/or counseling in coping with his new life as a cancer survivor after completing anti-cancer treatment.  He was offered support today through active listening and expressive supportive counseling.  He was given information regarding our available services and encouraged to contact me with any questions or for help enrolling in any of our support group/programs.      Nicholos Johns, PA-C

## 2019-10-11 NOTE — Progress Notes (Signed)
Patient returns for post seed follow up. Weight and vitals stable. Denies pain. Pre seed IPSS 4. Post seed IPSS 16. Reports dysuria. Endorses taking Flomax once per day. Denies hematuria. Reports urinary leakage has "actually improved." Denies diarrhea or rectal bleeding. Reports a history of hemorrhoids that are unchanged. Reports he was evaluated by the PA at [facility] last week and has a follow up in 3 months with his urologist.   BP 113/78   Pulse 60   Temp 98.2 F (36.8 C)   Resp 18   Ht 5\' 9"  (1.753 m)   Wt 153 lb 9.6 oz (69.7 kg)   BMI 22.68 kg/m  Wt Readings from Last 3 Encounters:  10/11/19 153 lb 9.6 oz (69.7 kg)  10/11/19 153 lb 9.6 oz (69.7 kg)  08/30/19 152 lb 9.6 oz (69.2 kg)

## 2019-10-13 DIAGNOSIS — H52223 Regular astigmatism, bilateral: Secondary | ICD-10-CM | POA: Diagnosis not present

## 2019-10-13 DIAGNOSIS — H5203 Hypermetropia, bilateral: Secondary | ICD-10-CM | POA: Diagnosis not present

## 2019-10-13 DIAGNOSIS — H524 Presbyopia: Secondary | ICD-10-CM | POA: Diagnosis not present

## 2019-10-13 DIAGNOSIS — H43813 Vitreous degeneration, bilateral: Secondary | ICD-10-CM | POA: Diagnosis not present

## 2019-10-13 DIAGNOSIS — H11153 Pinguecula, bilateral: Secondary | ICD-10-CM | POA: Diagnosis not present

## 2019-10-13 DIAGNOSIS — H0100A Unspecified blepharitis right eye, upper and lower eyelids: Secondary | ICD-10-CM | POA: Diagnosis not present

## 2019-10-13 DIAGNOSIS — H25813 Combined forms of age-related cataract, bilateral: Secondary | ICD-10-CM | POA: Diagnosis not present

## 2019-10-22 NOTE — Progress Notes (Signed)
  Radiation Oncology         [phone] ________________________________  Name: John [name] MRN: [id number]  Date: 10/11/2019  DOB: [birth year]  COMPLEX SIMULATION NOTE  NARRATIVE:  The patient was brought to the [facility] today following prostate seed implantation approximately one month ago.  Identity was confirmed.  All relevant records and images related to the planned course of therapy were reviewed.  Then, the patient was set-up supine.  CT images were obtained.  The CT images were loaded into the planning software.  Then the prostate and rectum were contoured.  Treatment planning then occurred.  The implanted iodine 125 seeds were identified by the physics staff for projection of radiation distribution  I have requested : 3D Simulation  I have requested a DVH of the following structures: Prostate and rectum.    ________________________________  Sheral Apley Tammi Klippel, M.D.

## 2019-10-26 ENCOUNTER — Encounter: Payer: Self-pay | Admitting: Radiation Oncology

## 2019-10-26 DIAGNOSIS — C61 Malignant neoplasm of prostate: Secondary | ICD-10-CM | POA: Diagnosis not present

## 2019-11-14 NOTE — Progress Notes (Signed)
  Radiation Oncology         (336) [phone] ________________________________  Name: John [name] MRN: [id number]  Date: 10/26/2019  DOB: 11/27/1950  3D Planning Note   Prostate Brachytherapy Post-Implant Dosimetry  Diagnosis: 69 y.o. gentleman with Stage T2a adenocarcinoma of the prostate with Gleason score of 3+4, and PSA of 11.9.  Narrative: On a previous date, John [name] returned following prostate seed implantation for post implant planning. He underwent CT scan complex simulation to delineate the three-dimensional structures of the pelvis and demonstrate the radiation distribution.  Since that time, the seed localization, and complex isodose planning with dose volume histograms have now been completed.  Results:   Prostate Coverage - The dose of radiation delivered to the 90% or more of the prostate gland (D90) was 90.54% of the prescription dose. This exceeds our goal of greater than 90%. Rectal Sparing - The volume of rectal tissue receiving the prescription dose or higher was 0.13 cc. This falls under our thresholds tolerance of 1.0 cc.  Impression: The prostate seed implant appears to show adequate target coverage and appropriate rectal sparing.  Plan:  The patient will continue to follow with urology for ongoing PSA determinations. I would anticipate a high likelihood for local tumor control with minimal risk for rectal morbidity.  ________________________________  Sheral Apley Tammi Klippel, M.D.

## 2019-12-07 ENCOUNTER — Other Ambulatory Visit: Payer: Self-pay

## 2019-12-07 ENCOUNTER — Ambulatory Visit (INDEPENDENT_AMBULATORY_CARE_PROVIDER_SITE_OTHER): Payer: Medicare Other | Admitting: *Deleted

## 2019-12-07 DIAGNOSIS — Z23 Encounter for immunization: Secondary | ICD-10-CM | POA: Diagnosis not present

## 2019-12-20 DIAGNOSIS — Z23 Encounter for immunization: Secondary | ICD-10-CM | POA: Diagnosis not present

## 2019-12-25 DIAGNOSIS — C61 Malignant neoplasm of prostate: Secondary | ICD-10-CM | POA: Diagnosis not present

## 2020-01-01 DIAGNOSIS — N5201 Erectile dysfunction due to arterial insufficiency: Secondary | ICD-10-CM | POA: Diagnosis not present

## 2020-01-01 DIAGNOSIS — Z8546 Personal history of malignant neoplasm of prostate: Secondary | ICD-10-CM | POA: Diagnosis not present

## 2020-01-01 DIAGNOSIS — N3943 Post-void dribbling: Secondary | ICD-10-CM | POA: Diagnosis not present

## 2020-03-04 NOTE — Progress Notes (Signed)
error 

## 2020-03-27 DIAGNOSIS — D2272 Melanocytic nevi of left lower limb, including hip: Secondary | ICD-10-CM | POA: Diagnosis not present

## 2020-03-27 DIAGNOSIS — L821 Other seborrheic keratosis: Secondary | ICD-10-CM | POA: Diagnosis not present

## 2020-03-27 DIAGNOSIS — D485 Neoplasm of uncertain behavior of skin: Secondary | ICD-10-CM | POA: Diagnosis not present

## 2020-03-27 DIAGNOSIS — Z1283 Encounter for screening for malignant neoplasm of skin: Secondary | ICD-10-CM | POA: Diagnosis not present

## 2020-06-25 DIAGNOSIS — C61 Malignant neoplasm of prostate: Secondary | ICD-10-CM | POA: Diagnosis not present

## 2020-07-02 DIAGNOSIS — Z8546 Personal history of malignant neoplasm of prostate: Secondary | ICD-10-CM | POA: Diagnosis not present

## 2020-07-03 DIAGNOSIS — Z23 Encounter for immunization: Secondary | ICD-10-CM | POA: Diagnosis not present

## 2020-07-07 IMAGING — MR MR PROSTATE WO/W CM
56 series · 56 of 56 positions shown · IV contrast (13ml multihance)
Comparison: 07/19/2015. prior biopsy results of 01/19/2017.

CLINICAL DATA: Prostate cancer.  Biopsy 01/15/2017.

EXAM:
MR PROSTATE WITHOUT AND WITH CONTRAST
TECHNIQUE: Multiplanar multisequence MRI images were obtained of the pelvis
centered about the prostate. Pre and post contrast images were
obtained.
CONTRAST:  13mL MULTIHANCE GADOBENATE DIMEGLUMINE 529 MG/ML IV SOLN

[Series 4: bSSFP fat-sat · axial · 8.0mm · 0.74mm/px · 1 of 28 slices shown]
[im 1/28]
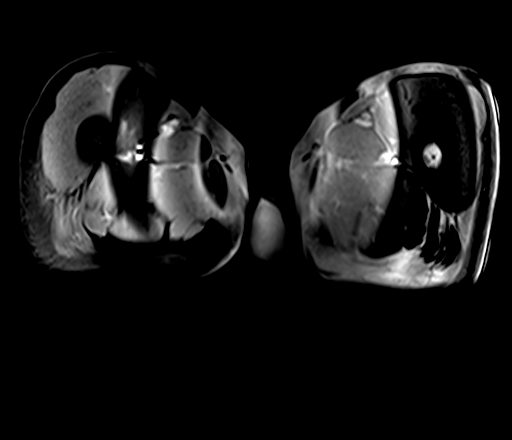

[Series 5: T1 · axial · 5.0mm · 1.25mm/px · 1 of 80 slices shown]
[im 1/80]
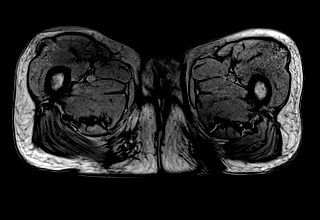

[Series 6: T2 · coronal · 3.5mm · 0.56mm/px · 1 of 23 slices shown (1 of 3)]
[im 1/23]
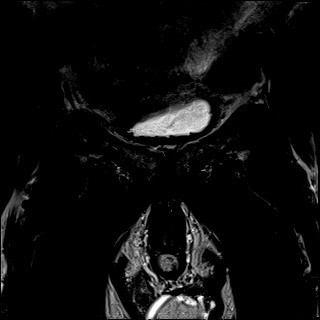

[Series 7: DWI · axial · 3.5mm · 1.75mm/px · 1 of 60 slices shown (1 of 3)]
[im 1/60]
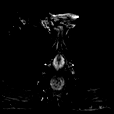

[Series 8: DWI · axial · 3.5mm · 1.75mm/px · 1 of 20 slices shown (2 of 3)]
[im 1/20]
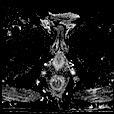

[Series 9: DWI · axial · 3.5mm · 1.56mm/px · 1 of 20 slices shown (3 of 3)]
[im 1/20]
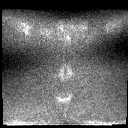

[Series 10: T2 · axial · 3.5mm · 0.56mm/px · 1 of 23 slices shown (2 of 3)]
[im 1/23]
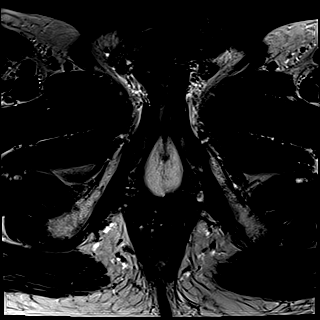

[Series 11: T2 · axial · 1.0mm · 1.04mm/px · 1 of 80 slices shown (3 of 3)]
[im 1/80]
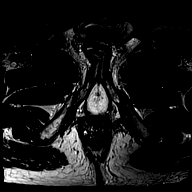

[Series 12: pre t1_twist_tra_dyn_ttc=5.3s · axial · non-contrast · 3.5mm · 0.83mm/px · 1 of 20 slices shown]
[im 1/20]
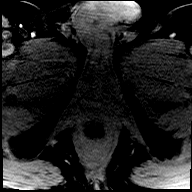

[Series 13: post t1_twist_tra_dyn-copy center · axial · 3.5mm · 0.83mm/px · 1 of 20 slices shown (1 of 24)]
[im 1/20]
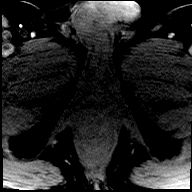

[Series 14: post t1_twist_tra_dyn-copy center · axial · 3.5mm · 0.83mm/px · 1 of 20 slices shown (2 of 24)]
[im 1/20]
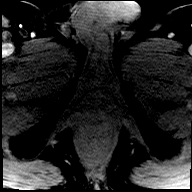

[Series 15: post t1_twist_tra_dyn-copy cent_sub_ttc=(id) · axial · 3.5mm · 0.83mm/px · 1 of 20 slices shown (1 of 23)]
[im 1/20]
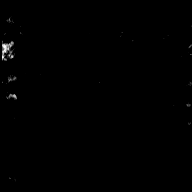

[Series 16: post t1_twist_tra_dyn-copy center · axial · 3.5mm · 0.83mm/px · 1 of 20 slices shown (3 of 24)]
[im 1/20]
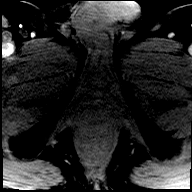

[Series 17: post t1_twist_tra_dyn-copy cent_sub_ttc=(id) · axial · 3.5mm · 0.83mm/px · 1 of 20 slices shown (2 of 23)]
[im 1/20]
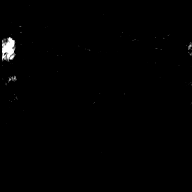

[Series 18: post t1_twist_tra_dyn-copy center · axial · 3.5mm · 0.83mm/px · 1 of 20 slices shown (4 of 24)]
[im 1/20]
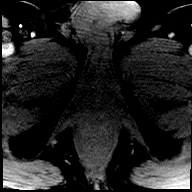

[Series 19: post t1_twist_tra_dyn-copy cent_sub_ttc=(id) · axial · 3.5mm · 0.83mm/px · 1 of 20 slices shown (3 of 23)]
[im 1/20]
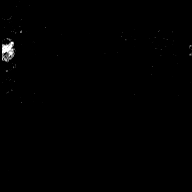

[Series 20: post t1_twist_tra_dyn-copy center · axial · 3.5mm · 0.83mm/px · 1 of 20 slices shown (5 of 24)]
[im 1/20]
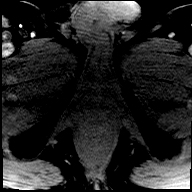

[Series 21: post t1_twist_tra_dyn-copy cent_sub_ttc=(id) · axial · 3.5mm · 0.83mm/px · 1 of 20 slices shown (4 of 23)]
[im 1/20]
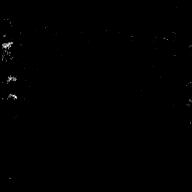

[Series 22: post t1_twist_tra_dyn-copy center · axial · 3.5mm · 0.83mm/px · 1 of 20 slices shown (6 of 24)]
[im 1/20]
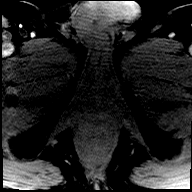

[Series 23: post t1_twist_tra_dyn-copy cent_sub_ttc=(id) · axial · 3.5mm · 0.83mm/px · 1 of 20 slices shown (5 of 23)]
[im 1/20]
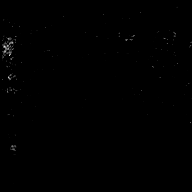

[Series 24: post t1_twist_tra_dyn-copy center · axial · 3.5mm · 0.83mm/px · 1 of 20 slices shown (7 of 24)]
[im 1/20]
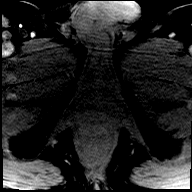

[Series 25: post t1_twist_tra_dyn-copy cent_sub_ttc=(id) · axial · 3.5mm · 0.83mm/px · 1 of 20 slices shown (6 of 23)]
[im 1/20]
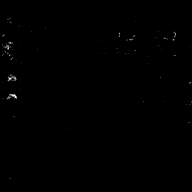

[Series 26: post t1_twist_tra_dyn-copy center · axial · 3.5mm · 0.83mm/px · 1 of 20 slices shown (8 of 24)]
[im 1/20]
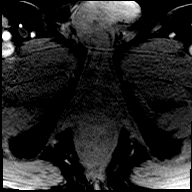

[Series 27: post t1_twist_tra_dyn-copy cent_sub_ttc=(id) · axial · 3.5mm · 0.83mm/px · 1 of 20 slices shown (7 of 23)]
[im 1/20]
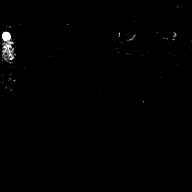

[Series 28: post t1_twist_tra_dyn-copy center · axial · 3.5mm · 0.83mm/px · 1 of 20 slices shown (9 of 24)]
[im 1/20]
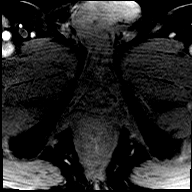

[Series 29: post t1_twist_tra_dyn-copy cent_sub_ttc=(id) · axial · 3.5mm · 0.83mm/px · 1 of 20 slices shown (8 of 23)]
[im 1/20]
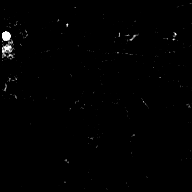

[Series 30: post t1_twist_tra_dyn-copy center · axial · 3.5mm · 0.83mm/px · 1 of 20 slices shown (10 of 24)]
[im 1/20]
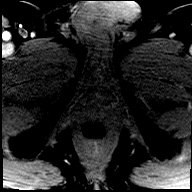

[Series 31: post t1_twist_tra_dyn-copy cent_sub_ttc=(id) · axial · 3.5mm · 0.83mm/px · 1 of 20 slices shown (9 of 23)]
[im 1/20]
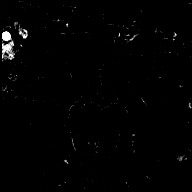

[Series 32: post t1_twist_tra_dyn-copy center · axial · 3.5mm · 0.83mm/px · 1 of 20 slices shown (11 of 24)]
[im 1/20]
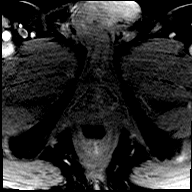

[Series 33: post t1_twist_tra_dyn-copy cent_sub_ttc=(id) · axial · 3.5mm · 0.83mm/px · 1 of 20 slices shown (10 of 23)]
[im 1/20]
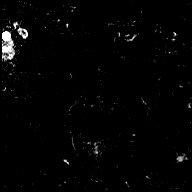

[Series 34: post t1_twist_tra_dyn-copy center · axial · 3.5mm · 0.83mm/px · 1 of 20 slices shown (12 of 24)]
[im 1/20]
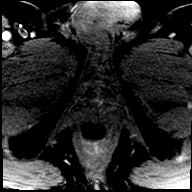

[Series 35: post t1_twist_tra_dyn-copy cent_sub_ttc=(id) · axial · 3.5mm · 0.83mm/px · 1 of 20 slices shown (11 of 23)]
[im 1/20]
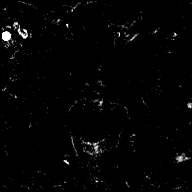

[Series 36: post t1_twist_tra_dyn-copy center · axial · 3.5mm · 0.83mm/px · 1 of 20 slices shown (13 of 24)]
[im 1/20]
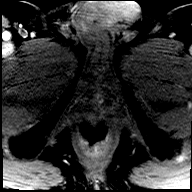

[Series 37: post t1_twist_tra_dyn-copy cent_sub_ttc=(id) · axial · 3.5mm · 0.83mm/px · 1 of 20 slices shown (12 of 23)]
[im 1/20]
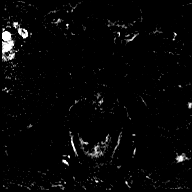

[Series 38: post t1_twist_tra_dyn-copy center · axial · 3.5mm · 0.83mm/px · 1 of 20 slices shown (14 of 24)]
[im 1/20]
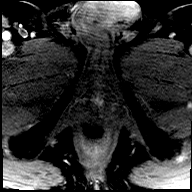

[Series 39: post t1_twist_tra_dyn-copy cent_sub_ttc=(id) · axial · 3.5mm · 0.83mm/px · 1 of 20 slices shown (13 of 23)]
[im 1/20]
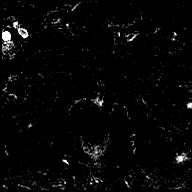

[Series 40: post t1_twist_tra_dyn-copy center · axial · 3.5mm · 0.83mm/px · 1 of 20 slices shown (15 of 24)]
[im 1/20]
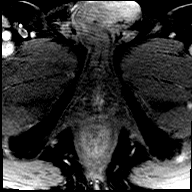

[Series 41: post t1_twist_tra_dyn-copy cent_sub_ttc=(id) · axial · 3.5mm · 0.83mm/px · 1 of 20 slices shown (14 of 23)]
[im 1/20]
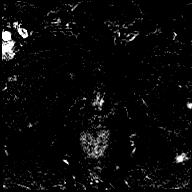

[Series 42: post t1_twist_tra_dyn-copy center · axial · 3.5mm · 0.83mm/px · 1 of 20 slices shown (16 of 24)]
[im 1/20]
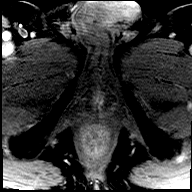

[Series 43: post t1_twist_tra_dyn-copy cent_sub_ttc=(id) · axial · 3.5mm · 0.83mm/px · 1 of 20 slices shown (15 of 23)]
[im 1/20]
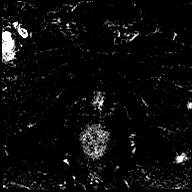

[Series 44: post t1_twist_tra_dyn-copy center · axial · 3.5mm · 0.83mm/px · 1 of 20 slices shown (17 of 24)]
[im 1/20]
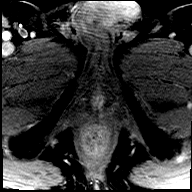

[Series 45: post t1_twist_tra_dyn-copy cent_sub_ttc=(id) · axial · 3.5mm · 0.83mm/px · 1 of 20 slices shown (16 of 23)]
[im 1/20]
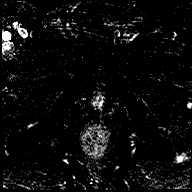

[Series 46: post t1_twist_tra_dyn-copy center · axial · 3.5mm · 0.83mm/px · 1 of 20 slices shown (18 of 24)]
[im 1/20]
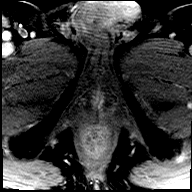

[Series 47: post t1_twist_tra_dyn-copy cent_sub_ttc=(id) · axial · 3.5mm · 0.83mm/px · 1 of 20 slices shown (17 of 23)]
[im 1/20]
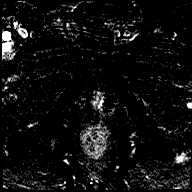

[Series 48: post t1_twist_tra_dyn-copy center · axial · 3.5mm · 0.83mm/px · 1 of 20 slices shown (19 of 24)]
[im 1/20]
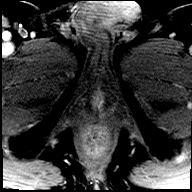

[Series 49: post t1_twist_tra_dyn-copy cent_sub_ttc=(id) · axial · 3.5mm · 0.83mm/px · 1 of 20 slices shown (18 of 23)]
[im 1/20]
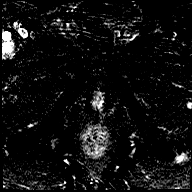

[Series 50: post t1_twist_tra_dyn-copy center · axial · 3.5mm · 0.83mm/px · 1 of 20 slices shown (20 of 24)]
[im 1/20]
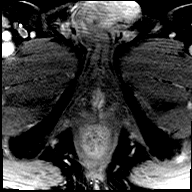

[Series 51: post t1_twist_tra_dyn-copy cent_sub_ttc=(id) · axial · 3.5mm · 0.83mm/px · 1 of 20 slices shown (19 of 23)]
[im 1/20]
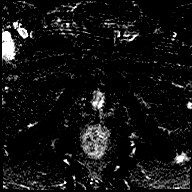

[Series 52: post t1_twist_tra_dyn-copy center · axial · 3.5mm · 0.83mm/px · 1 of 20 slices shown (21 of 24)]
[im 1/20]
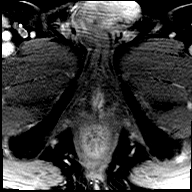

[Series 53: post t1_twist_tra_dyn-copy cent_sub_ttc=(id) · axial · 3.5mm · 0.83mm/px · 1 of 20 slices shown (20 of 23)]
[im 1/20]
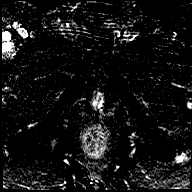

[Series 54: post t1_twist_tra_dyn-copy center · axial · 3.5mm · 0.83mm/px · 1 of 20 slices shown (22 of 24)]
[im 1/20]
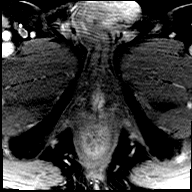

[Series 55: post t1_twist_tra_dyn-copy cent_sub_ttc=(id) · axial · 3.5mm · 0.83mm/px · 1 of 20 slices shown (21 of 23)]
[im 1/20]
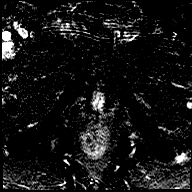

[Series 56: post t1_twist_tra_dyn-copy center · axial · 3.5mm · 0.83mm/px · 1 of 20 slices shown (23 of 24)]
[im 1/20]
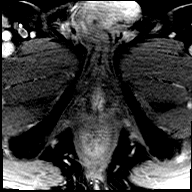

[Series 57: post t1_twist_tra_dyn-copy cent_sub_ttc=(id) · axial · 3.5mm · 0.83mm/px · 1 of 20 slices shown (22 of 23)]
[im 1/20]
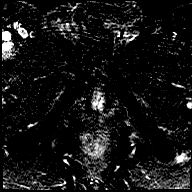

[Series 58: post t1_twist_tra_dyn-copy center · axial · 3.5mm · 0.83mm/px · 1 of 20 slices shown (24 of 24)]
[im 1/20]
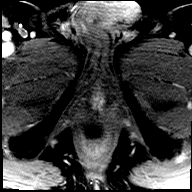

[Series 59: post t1_twist_tra_dyn-copy cent_sub_ttc=(id) · axial · 3.5mm · 0.83mm/px · 1 of 20 slices shown (23 of 23)]
[im 1/20]
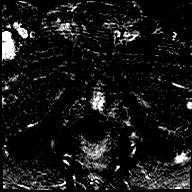

[56 of 56 positions shown; findings below may reference images not displayed]

FINDINGS: Prostate: Demonstrates relatively mild central gland enlargement and
heterogeneity, consistent with benign prostatic hyperplasia.

Interval enlargement of previously described anterior left mid to
apical gland nodule. This involves both the peripheral zone and
central gland, currently measuring on the order of 1.8 x 1.4 cm on
[DATE]. 1.7 x 1.5 cm on [DATE]. 1.6 cm craniocaudal on coronal image
[DATE]. Compare 10 mm on 07/19/2015. Corresponds to significantly
decreased signal on ADC map, including [DATE]. Hyperintensity on long
B value diffusion weighted imaging, including [DATE].

Early post-contrast enhancement, including on 13/31.

Volume: 5.0 x 3.3 by 4.4 cm (volume = 38 cm^3)

Transcapsular spread: Capsular bulge and irregularity, consistent
with trans capsular spread anteriorly. Inferiorly, extension beyond
the capsule including on [DATE].

Seminal vesicle involvement: Absent

Neurovascular bundle involvement: Absent

Pelvic adenopathy: Absent

Bone metastasis: Absent

Other findings: No significant free fluid.  Normal urinary bladder.
IMPRESSION: 1. Progression of dominant left anterior mid to apical gland nodule,
which may arise from the central gland or peripheral zone. Findings
consistent with trans capsular spread. PI-RADS(v2.1)-5.
2. No pelvic adenopathy.

## 2020-08-14 DIAGNOSIS — D225 Melanocytic nevi of trunk: Secondary | ICD-10-CM | POA: Diagnosis not present

## 2020-08-14 DIAGNOSIS — D485 Neoplasm of uncertain behavior of skin: Secondary | ICD-10-CM | POA: Diagnosis not present

## 2020-08-30 ENCOUNTER — Encounter: Payer: Self-pay | Admitting: Medical Oncology

## 2020-10-18 DIAGNOSIS — H5203 Hypermetropia, bilateral: Secondary | ICD-10-CM | POA: Diagnosis not present

## 2020-10-18 DIAGNOSIS — H02834 Dermatochalasis of left upper eyelid: Secondary | ICD-10-CM | POA: Diagnosis not present

## 2020-10-18 DIAGNOSIS — H2513 Age-related nuclear cataract, bilateral: Secondary | ICD-10-CM | POA: Diagnosis not present

## 2020-10-18 DIAGNOSIS — H52223 Regular astigmatism, bilateral: Secondary | ICD-10-CM | POA: Diagnosis not present

## 2020-10-18 DIAGNOSIS — H43813 Vitreous degeneration, bilateral: Secondary | ICD-10-CM | POA: Diagnosis not present

## 2020-10-18 DIAGNOSIS — H02831 Dermatochalasis of right upper eyelid: Secondary | ICD-10-CM | POA: Diagnosis not present

## 2020-10-18 DIAGNOSIS — H524 Presbyopia: Secondary | ICD-10-CM | POA: Diagnosis not present

## 2020-12-12 DIAGNOSIS — H2513 Age-related nuclear cataract, bilateral: Secondary | ICD-10-CM | POA: Diagnosis not present

## 2020-12-12 DIAGNOSIS — H2511 Age-related nuclear cataract, right eye: Secondary | ICD-10-CM | POA: Diagnosis not present

## 2020-12-12 DIAGNOSIS — H25043 Posterior subcapsular polar age-related cataract, bilateral: Secondary | ICD-10-CM | POA: Diagnosis not present

## 2020-12-12 DIAGNOSIS — H18413 Arcus senilis, bilateral: Secondary | ICD-10-CM | POA: Diagnosis not present

## 2020-12-12 DIAGNOSIS — H25013 Cortical age-related cataract, bilateral: Secondary | ICD-10-CM | POA: Diagnosis not present

## 2020-12-24 DIAGNOSIS — Z1283 Encounter for screening for malignant neoplasm of skin: Secondary | ICD-10-CM | POA: Diagnosis not present

## 2020-12-24 DIAGNOSIS — D485 Neoplasm of uncertain behavior of skin: Secondary | ICD-10-CM | POA: Diagnosis not present

## 2020-12-24 DIAGNOSIS — D2271 Melanocytic nevi of right lower limb, including hip: Secondary | ICD-10-CM | POA: Diagnosis not present

## 2020-12-24 DIAGNOSIS — D225 Melanocytic nevi of trunk: Secondary | ICD-10-CM | POA: Diagnosis not present

## 2020-12-31 DIAGNOSIS — Z8546 Personal history of malignant neoplasm of prostate: Secondary | ICD-10-CM | POA: Diagnosis not present

## 2021-01-07 DIAGNOSIS — N5201 Erectile dysfunction due to arterial insufficiency: Secondary | ICD-10-CM | POA: Diagnosis not present

## 2021-01-07 DIAGNOSIS — Z8546 Personal history of malignant neoplasm of prostate: Secondary | ICD-10-CM | POA: Diagnosis not present

## 2021-03-10 DIAGNOSIS — H25013 Cortical age-related cataract, bilateral: Secondary | ICD-10-CM | POA: Diagnosis not present

## 2021-03-10 DIAGNOSIS — H18593 Other hereditary corneal dystrophies, bilateral: Secondary | ICD-10-CM | POA: Diagnosis not present

## 2021-03-10 DIAGNOSIS — H2513 Age-related nuclear cataract, bilateral: Secondary | ICD-10-CM | POA: Diagnosis not present

## 2021-03-10 DIAGNOSIS — H01021 Squamous blepharitis right upper eyelid: Secondary | ICD-10-CM | POA: Diagnosis not present

## 2021-03-10 DIAGNOSIS — H2511 Age-related nuclear cataract, right eye: Secondary | ICD-10-CM | POA: Diagnosis not present

## 2021-04-29 DIAGNOSIS — H2512 Age-related nuclear cataract, left eye: Secondary | ICD-10-CM | POA: Diagnosis not present

## 2021-04-29 DIAGNOSIS — H2511 Age-related nuclear cataract, right eye: Secondary | ICD-10-CM | POA: Diagnosis not present

## 2021-05-13 DIAGNOSIS — H2512 Age-related nuclear cataract, left eye: Secondary | ICD-10-CM | POA: Diagnosis not present

## 2021-06-17 ENCOUNTER — Telehealth: Payer: Self-pay

## 2021-06-17 NOTE — Telephone Encounter (Signed)
Last OV 02/09/18 therefore pt would be considered new patient.  ?Pt notified of above & states he was unaware that he needed to see PCP since Medicare doesn't cover CPEs. Explained AWV vs CPE & pt states he wants to remain a pt of John [name].  ?Will send to PCP for approval. ?

## 2021-06-24 NOTE — Telephone Encounter (Signed)
Appt scheduled for 09/03/21

## 2021-07-07 DIAGNOSIS — Z8546 Personal history of malignant neoplasm of prostate: Secondary | ICD-10-CM | POA: Diagnosis not present

## 2021-07-07 LAB — PSA: PSA: 2.04

## 2021-07-14 DIAGNOSIS — Z8546 Personal history of malignant neoplasm of prostate: Secondary | ICD-10-CM | POA: Diagnosis not present

## 2021-07-14 DIAGNOSIS — N5201 Erectile dysfunction due to arterial insufficiency: Secondary | ICD-10-CM | POA: Diagnosis not present

## 2021-07-21 ENCOUNTER — Encounter: Payer: Self-pay | Admitting: Internal Medicine

## 2021-09-03 ENCOUNTER — Ambulatory Visit (INDEPENDENT_AMBULATORY_CARE_PROVIDER_SITE_OTHER): Payer: Medicare Other | Admitting: Internal Medicine

## 2021-09-03 VITALS — BP 120/68 | HR 54 | Temp 97.5°F | Ht 69.0 in | Wt 158.0 lb

## 2021-09-03 DIAGNOSIS — Z0000 Encounter for general adult medical examination without abnormal findings: Secondary | ICD-10-CM

## 2021-09-03 DIAGNOSIS — Z136 Encounter for screening for cardiovascular disorders: Secondary | ICD-10-CM | POA: Diagnosis not present

## 2021-09-03 DIAGNOSIS — C61 Malignant neoplasm of prostate: Secondary | ICD-10-CM

## 2021-09-03 DIAGNOSIS — R5383 Other fatigue: Secondary | ICD-10-CM | POA: Diagnosis not present

## 2021-09-03 DIAGNOSIS — Z1211 Encounter for screening for malignant neoplasm of colon: Secondary | ICD-10-CM | POA: Diagnosis not present

## 2021-09-03 LAB — COMPREHENSIVE METABOLIC PANEL
ALT: 12 U/L (ref 0–53)
AST: 16 U/L (ref 0–37)
Albumin: 4.8 g/dL (ref 3.5–5.2)
Alkaline Phosphatase: 70 U/L (ref 39–117)
BUN: 12 mg/dL (ref 6–23)
CO2: 29 mEq/L (ref 19–32)
Calcium: 9.7 mg/dL (ref 8.4–10.5)
Chloride: 103 mEq/L (ref 96–112)
Creatinine, Ser: 0.85 mg/dL (ref 0.40–1.50)
GFR: 86.22 mL/min (ref >60.00)
Glucose, Bld: 89 mg/dL (ref 70–99)
Potassium: 4.6 mEq/L (ref 3.5–5.1)
Sodium: 140 mEq/L (ref 135–145)
Total Bilirubin: 0.8 mg/dL (ref 0.2–1.2)
Total Protein: 6.7 g/dL (ref 6.0–8.3)

## 2021-09-03 LAB — LIPID PANEL
Cholesterol: 181 mg/dL (ref 0–200)
HDL: 50.2 mg/dL (ref >39.00)
LDL Cholesterol: 113 mg/dL — ABNORMAL HIGH (ref 0–99)
NonHDL: 131.11
Total CHOL/HDL Ratio: 4
Triglycerides: 89 mg/dL (ref 0.0–149.0)
VLDL: 17.8 mg/dL (ref 0.0–40.0)

## 2021-09-03 LAB — CBC WITH DIFFERENTIAL/PLATELET
Basophils Absolute: 0 10*3/uL (ref 0.0–0.1)
Basophils Relative: 0.5 % (ref 0.0–3.0)
Eosinophils Absolute: 0 10*3/uL (ref 0.0–0.7)
Eosinophils Relative: 0.8 % (ref 0.0–5.0)
HCT: 44.3 % (ref 39.0–52.0)
Hemoglobin: 14.9 g/dL (ref 13.0–17.0)
Lymphocytes Relative: 31.6 % (ref 12.0–46.0)
Lymphs Abs: 1.3 10*3/uL (ref 0.7–4.0)
MCHC: 33.6 g/dL (ref 30.0–36.0)
MCV: 92.8 fl (ref 78.0–100.0)
Monocytes Absolute: 0.5 10*3/uL (ref 0.1–1.0)
Monocytes Relative: 13.1 % — ABNORMAL HIGH (ref 3.0–12.0)
Neutro Abs: 2.2 10*3/uL (ref 1.4–7.7)
Neutrophils Relative %: 54 % (ref 43.0–77.0)
Platelets: 153 10*3/uL (ref 150.0–400.0)
RBC: 4.77 Mil/uL (ref 4.22–5.81)
RDW: 13.6 % (ref 11.5–15.5)
WBC: 4 10*3/uL (ref 4.0–10.5)

## 2021-09-03 LAB — VITAMIN B12: Vitamin B-12: 227 pg/mL (ref 211–911)

## 2021-09-03 LAB — TSH: TSH: 1.41 u[IU]/mL (ref 0.35–5.50)

## 2021-09-03 NOTE — Patient Instructions (Signed)
-  Nice seeing you today!!  -Lab work today; will notify you once results are available.  -GI referral placed.  -Schedule follow up in 1 year or sooner as needed.

## 2021-09-03 NOTE — Progress Notes (Signed)
Established Patient Office Visit     CC/Reason for Visit: Subsequent Medicare wellness visit and discuss acute concerns and chronic issues  HPI: John [name] is a 71 y.o. male who is coming in today for the above mentioned reasons. Past Medical History is significant for: Prostate cancer who is status post seed implant 2 years ago, they are following PSAs.  He sees his urologist, Dr. Louis Meckel, every 6 months.  He has had both cataracts repair in the last few months.  He has routine eye and dental care, no perceived hearing issues.  His last colonoscopy was in 2013 so he is now due.  He might be due for bivalent COVID-vaccine, however he believes that he had it done over the fall, he will get records for [place].  He has been complaining of significant fatigue and decreased energy that has been ongoing for a few months.  It comes in spells where it is worse for a few days or weeks and then gets better.  He declines chest pain, shortness of breath with exertion.   Past Medical/Surgical History: Past Medical History:  Diagnosis Date   Chronic constipation    ED (erectile dysfunction)    Family history of coronary artery disease    Hemorrhoids    History of colon polyps 2006   History of hepatitis A age 19   History of syncope    per pt hx syncopy episodes throughout life--- consultation w/ dr Marlou Porch (cardiologist)--  negative stress test  (per lov note 04-19-2014  vasovagal/ situtional )   Hyperplasia of prostate with lower urinary tract symptoms (LUTS)    Prostate cancer [facility]) urologist-  dr Louis Meckel   dx 08-23-2015--  Gleason 3+4, PSA 4.75-- active surveillance;  04/ 2021 Stage T2a, Gleason 3+4, PSA 11.9, scheduled for radiactive prostate seed implants 08-30-2019   Wears glasses    Wears glasses     Past Surgical History:  Procedure Laterality Date   CARDIOVASCULAR STRESS TEST  05-11-2013   dr Marlou Porch   Low risk nuclear study w/ small fixed defect in the inferolateral wall much more  prominent on rest images c/w diaphragmatic attenuation, no evidence ishcemia/  normal LV function and wall motion, ef 62%   COLONOSCOPY  last one 08-25-2011   CYSTOSCOPY N/A 08/30/2019   Procedure: CYSTOSCOPY FLEXIBLE;  Surgeon: Ardis Hughs, MD;  Location: [facility];  Service: Urology;  Laterality: N/A;  no seeds found in bladder   PROSTATE BIOPSY N/A 01/15/2017   Procedure: BIOPSY TRANSRECTAL ULTRASONIC PROSTATE (TUBP);  Surgeon: Ardis Hughs, MD;  Location: [facility];  Service: Urology;  Laterality: N/A;   PROSTATE BIOPSY  2017   PROSTATE BIOPSY N/A 05/18/2019   Procedure: SATURATION BIOPSY TRANSRECTAL ULTRASONIC PROSTATE (TUBP);  Surgeon: Ardis Hughs, MD;  Location: [facility];  Service: Urology;  Laterality: N/A;   RADIOACTIVE SEED IMPLANT N/A 08/30/2019   Procedure: RADIOACTIVE SEED IMPLANT/BRACHYTHERAPY IMPLANT;  Surgeon: Ardis Hughs, MD;  Location: [facility];  Service: Urology;  Laterality: N/A;   71 seeds implanted   SPACE OAR INSTILLATION N/A 08/30/2019   Procedure: SPACE OAR INSTILLATION;  Surgeon: Ardis Hughs, MD;  Location: [facility];  Service: Urology;  Laterality: N/A;   TONSILLECTOMY  age 64    Social History:  reports that he has never smoked. He has never used smokeless tobacco. He reports current alcohol use. He reports that he does not use drugs.  Allergies: No Known Allergies  Family History:  Family History  Problem Relation Age of Onset   Breast cancer Mother    Heart disease Mother        CHF   Parkinsonism Mother    Heart attack Father    Colon polyps Father    Heart disease Brother        post CABG   Cancer Maternal Aunt        believe she suffered from colon ca but isn't certain   Lung cancer Maternal Uncle        heavy smoker   Cancer Cousin        first cousin. type unknown.   Colon cancer Neg Hx    Esophageal cancer Neg Hx     Stomach cancer Neg Hx      Current Outpatient Medications:    EQ HEMORRHOIDAL MAX ST 1-0.25-14.4-15 % CREA, Apply topically daily. [employer] brand equate, Disp: , Rfl:    sildenafil (REVATIO) 20 MG tablet, Take 2-5 tablets by mouth as needed, Disp: , Rfl: 5   hydrocortisone (ANUSOL-HC) 25 MG suppository, PLACE 1 SUPPOSITORY (25 MG TOTAL) RECTALLY AS NEEDED FOR ITCHING. (Patient not taking: Reported on 09/03/2021), Disp: 12 suppository, Rfl: 0  Review of Systems:  Constitutional: Denies fever, chills, diaphoresis, appetite change.  HEENT: Denies photophobia, eye pain, redness, hearing loss, ear pain, congestion, sore throat, rhinorrhea, sneezing, mouth sores, trouble swallowing, neck pain, neck stiffness and tinnitus.   Respiratory: Denies SOB, DOE, cough, chest tightness,  and wheezing.   Cardiovascular: Denies chest pain, palpitations and leg swelling.  Gastrointestinal: Denies nausea, vomiting, abdominal pain, diarrhea, constipation, blood in stool and abdominal distention.  Genitourinary: Denies dysuria, urgency, frequency, hematuria, flank pain and difficulty urinating.  Endocrine: Denies: hot or cold intolerance, sweats, changes in hair or nails, polyuria, polydipsia. Musculoskeletal: Denies myalgias, back pain, joint swelling, arthralgias and gait problem.  Skin: Denies pallor, rash and wound.  Neurological: Denies dizziness, seizures, syncope, weakness, light-headedness, numbness and headaches.  Hematological: Denies adenopathy. Easy bruising, personal or family bleeding history  Psychiatric/Behavioral: Denies suicidal ideation, mood changes, confusion, nervousness, sleep disturbance and agitation    Physical Exam: Vitals:   09/03/21 1104  BP: 120/68  Pulse: (!) 54  Temp: (!) 97.5 F (36.4 C)  TempSrc: Oral  SpO2: 97%  Weight: 158 lb (71.7 kg)  Height: '5\' 9"'$  (1.753 m)    Body mass index is 23.33 kg/m.   Constitutional: NAD, calm, comfortable Eyes: PERRL, lids and  conjunctivae normal ENMT: Mucous membranes are moist. Posterior pharynx clear of any exudate or lesions. Normal dentition. Tympanic membrane is pearly white, no erythema or bulging. Neck: normal, supple, no masses, no thyromegaly Respiratory: clear to auscultation bilaterally, no wheezing, no crackles. Normal respiratory effort. No accessory muscle use.  Cardiovascular: Regular rate and rhythm, no murmurs / rubs / gallops. No extremity edema. 2+ pedal pulses. No carotid bruits.  Abdomen: no tenderness, no masses palpated. No hepatosplenomegaly. Bowel sounds positive.  Musculoskeletal: no clubbing / cyanosis. No joint deformity upper and lower extremities. Good ROM, no contractures. Normal muscle tone.  Skin: no rashes, lesions, ulcers. No induration Neurologic: CN 2-12 grossly intact. Sensation intact, DTR normal. Strength 5/5 in all 4.  Psychiatric: Normal judgment and insight. Alert and oriented x 3. Normal mood.    Subsequent Medicare wellness visit   1. Risk factors, based on past  M,S,F -cardiovascular disease risk factors include age, gender   2.  Physical activities: Very active with  his 5 acre property   3.  Depression/mood: Stable, not depressed   4.  Hearing: No perceived issues   5.  ADL's: Independent in all ADLs   6.  Fall risk: Low fall risk   7.  Home safety: No problems identified   8.  Height weight, and visual acuity: height and weight as above, vision:  Vision Screening   Right eye Left eye Both eyes  Without correction '20/25 20/20 20/20 '$  With correction        9.  Counseling: Advised he update immunizations and colonoscopy   10. Lab orders based on risk factors: Laboratory update will be reviewed   11. Referral : GI   12. Care plan: Follow-up with me in 6 to 12 months   13. Cognitive assessment: No cognitive impairment   14. Screening: Patient provided with a written and personalized 5-10 year screening schedule in the AVS. yes   15. Provider List  Update: PCP, urologist, ophthalmologist  16. Advance Directives: Full code   17. Opioids: Patient is not on any opioid prescriptions and has no risk factors for a substance use disorder.   [facility] Office Visit from 09/03/2021 in [facility]  PHQ-9 Total Score 1          09/09/2017    1:55 PM 02/09/2018    2:22 PM 06/20/2019    1:42 PM 10/11/2019   10:17 AM 09/03/2021   11:05 AM  [place] in the past year? No 0   0  Was there an injury with Fall?  0     Fall Risk Category Calculator  0     Fall Risk Category  Low     Patient Fall Risk Level   Low fall risk Low fall risk      Impression and Plan:  Encounter for subsequent annual wellness visit (AWV) in Medicare patient  - Plan: Lipid panel -Recommend routine eye and dental care. -Immunizations: Immunizations are up-to-date -Healthy lifestyle discussed in detail. -Labs to be updated today. -Colon cancer screening: 08/2011, referral placed -Breast cancer screening: Not applicable -Cervical cancer screening: Not applicable -Lung cancer screening: Never smoker, not applicable -Prostate cancer screening: Has history of prostate cancer -DEXA: Not applicable  Malignant neoplasm of prostate ([place])  - Plan: CBC with Differential/Platelet, Comprehensive metabolic panel -Follows with urology as scheduled.  Fatigue, unspecified type Low energy  - Plan: TSH, Vitamin B12 -Start with basic labs to rule out anemia, hypothyroidism, vitamin B12 deficiency.  If normal can consider things like depression, echocardiogram.  Colon cancer screening  - Plan: Ambulatory referral to Gastroenterology     Patient Instructions  -Nice seeing you today!!  -Lab work today; will notify you once results are available.  -GI referral placed.  -Schedule follow up in 1 year or sooner as needed.      Lelon Frohlich, MD [facility]

## 2021-09-08 DIAGNOSIS — H18413 Arcus senilis, bilateral: Secondary | ICD-10-CM | POA: Diagnosis not present

## 2021-09-08 DIAGNOSIS — H26493 Other secondary cataract, bilateral: Secondary | ICD-10-CM | POA: Diagnosis not present

## 2021-09-08 DIAGNOSIS — H02831 Dermatochalasis of right upper eyelid: Secondary | ICD-10-CM | POA: Diagnosis not present

## 2021-09-08 DIAGNOSIS — H26492 Other secondary cataract, left eye: Secondary | ICD-10-CM | POA: Diagnosis not present

## 2021-09-16 DIAGNOSIS — Z961 Presence of intraocular lens: Secondary | ICD-10-CM | POA: Diagnosis not present

## 2021-09-16 DIAGNOSIS — H26491 Other secondary cataract, right eye: Secondary | ICD-10-CM | POA: Diagnosis not present

## 2021-09-16 DIAGNOSIS — H18593 Other hereditary corneal dystrophies, bilateral: Secondary | ICD-10-CM | POA: Diagnosis not present

## 2021-09-19 ENCOUNTER — Encounter: Payer: Self-pay | Admitting: Gastroenterology

## 2021-10-13 ENCOUNTER — Encounter: Payer: Self-pay | Admitting: Gastroenterology

## 2021-10-30 ENCOUNTER — Ambulatory Visit (AMBULATORY_SURGERY_CENTER): Payer: Self-pay

## 2021-10-30 VITALS — Ht 69.0 in | Wt 160.0 lb

## 2021-10-30 DIAGNOSIS — Z1211 Encounter for screening for malignant neoplasm of colon: Secondary | ICD-10-CM

## 2021-10-30 MED ORDER — NA SULFATE-K SULFATE-MG SULF 17.5-3.13-1.6 GM/177ML PO SOLN: 1.0000 | Freq: Once | ORAL | 0 refills | Status: AC

## 2021-10-30 NOTE — Progress Notes (Signed)
No egg or soy allergy known to patient  No issues known to pt with past sedation with any surgeries or procedures Patient denies ever being told they had issues or difficulty with intubation  No FH of Malignant Hyperthermia Pt is not on diet pills Pt is not on  home 02  Pt is not on blood thinners  Pt denies issues with constipation  No A fib or A flutter Have any cardiac testing pending--no Pt instructed to use Singlecare.[email or web address] or GoodRx for a price reduction on prep   

## 2021-11-11 DIAGNOSIS — Z961 Presence of intraocular lens: Secondary | ICD-10-CM | POA: Diagnosis not present

## 2021-11-11 DIAGNOSIS — H43813 Vitreous degeneration, bilateral: Secondary | ICD-10-CM | POA: Diagnosis not present

## 2021-11-11 DIAGNOSIS — H18593 Other hereditary corneal dystrophies, bilateral: Secondary | ICD-10-CM | POA: Diagnosis not present

## 2021-11-11 DIAGNOSIS — H524 Presbyopia: Secondary | ICD-10-CM | POA: Diagnosis not present

## 2021-11-11 DIAGNOSIS — H26491 Other secondary cataract, right eye: Secondary | ICD-10-CM | POA: Diagnosis not present

## 2021-11-13 ENCOUNTER — Encounter: Payer: Self-pay | Admitting: Gastroenterology

## 2021-11-13 ENCOUNTER — Ambulatory Visit (AMBULATORY_SURGERY_CENTER): Payer: Medicare Other | Admitting: Gastroenterology

## 2021-11-13 VITALS — BP 119/70 | HR 58 | Temp 97.5°F | Resp 12 | Ht 69.0 in | Wt 160.0 lb

## 2021-11-13 DIAGNOSIS — Z1211 Encounter for screening for malignant neoplasm of colon: Secondary | ICD-10-CM

## 2021-11-13 DIAGNOSIS — D128 Benign neoplasm of rectum: Secondary | ICD-10-CM

## 2021-11-13 DIAGNOSIS — D123 Benign neoplasm of transverse colon: Secondary | ICD-10-CM

## 2021-11-13 MED ORDER — SODIUM CHLORIDE 0.9 % IV SOLN: 500.0000 mL | Freq: Once | INTRAVENOUS | Status: DC

## 2021-11-13 NOTE — Op Note (Signed)
[facility] Patient Name: John [name] Procedure Date: 11/13/2021 2:31 PM MRN: [id number] Endoscopist: [place]. Candis Schatz , MD Age: 71 Referring MD:  Date of Birth: February 22, 1950 Gender: Male Account #: [id number] Procedure:                Colonoscopy Indications:              Screening for colorectal malignant neoplasm (last                            colonoscopy was 10 years ago) Medicines:                Monitored Anesthesia Care Procedure:                Pre-Anesthesia Assessment:                           - Prior to the procedure, a History and Physical                            was performed, and patient medications and                            allergies were reviewed. The patient's tolerance of                            previous anesthesia was also reviewed. The risks                            and benefits of the procedure and the sedation                            options and risks were discussed with the patient.                            All questions were answered, and informed consent                            was obtained. Prior Anticoagulants: The patient has                            taken no previous anticoagulant or antiplatelet                            agents. ASA Grade Assessment: II - A patient with                            mild systemic disease. After reviewing the risks                            and benefits, the patient was deemed in                            satisfactory condition to undergo the procedure.  After obtaining informed consent, the colonoscope                            was passed under direct vision. Throughout the                            procedure, the patient's blood pressure, pulse, and                            oxygen saturations were monitored continuously. The                            CF [id number] #[id number] was introduced through the anus                            and advanced to the the  terminal ileum, with                            identification of the appendiceal orifice and IC                            valve. The colonoscopy was performed without                            difficulty. The patient tolerated the procedure                            well. The quality of the bowel preparation was                            good. The terminal ileum, ileocecal valve,                            appendiceal orifice, and rectum were photographed.                            The bowel preparation used was SUPREP via split                            dose instruction. Scope In: 2:40:49 PM Scope Out: 3:04:16 PM Scope Withdrawal Time: 0 hours 17 minutes 1 second  Total Procedure Duration: 0 hours 23 minutes 27 seconds  Findings:                 The perianal and digital rectal examinations were                            normal. Pertinent negatives include normal                            sphincter tone and no palpable rectal lesions.                           A 3 mm polyp was found in the appendiceal orifice.  The polyp was sessile. The polyp was removed with a                            cold snare. Resection and retrieval were complete.                            Estimated blood loss was minimal.                           A 3 mm polyp was found in the hepatic flexure. The                            polyp was sessile. The polyp was removed with a                            cold snare. Resection and retrieval were complete.                            Estimated blood loss was minimal.                           A 3 mm polyp was found in the transverse colon. The                            polyp was sessile. The polyp was removed with a                            cold snare. Resection and retrieval were complete.                            Estimated blood loss was minimal.                           A 2 mm polyp was found in the rectum. The polyp was                             sessile. The polyp was removed with a cold snare.                            Resection and retrieval were complete. Estimated                            blood loss was minimal.                           The exam was otherwise normal throughout the                            examined colon.                           The terminal ileum appeared normal.  Anal papilla(e) were hypertrophied.                           No additional abnormalities were found on                            retroflexion. Complications:            No immediate complications. Estimated Blood Loss:     Estimated blood loss was minimal. Impression:               - One 3 mm polyp at the appendiceal orifice,                            removed with a cold snare. Resected and retrieved.                           - One 3 mm polyp at the hepatic flexure, removed                            with a cold snare. Resected and retrieved.                           - One 3 mm polyp in the transverse colon, removed                            with a cold snare. Resected and retrieved.                           - One 2 mm polyp in the rectum, removed with a cold                            snare. Resected and retrieved.                           - The examined portion of the ileum was normal.                           - Anal papilla(e) were hypertrophied. Recommendation:           - Patient has a contact number available for                            emergencies. The signs and symptoms of potential                            delayed complications were discussed with the                            patient. Return to normal activities tomorrow.                            Written discharge instructions were provided to the  patient.                           - Resume previous diet.                           - Continue present medications.                           -  Await pathology results.                           - Repeat colonoscopy (date not yet determined) for                            surveillance based on pathology results. Colby Reels E. Candis Schatz, MD 11/13/2021 3:12:26 PM This report has been signed electronically.

## 2021-11-13 NOTE — Progress Notes (Signed)
Called to room to assist during endoscopic procedure.  Patient ID and intended procedure confirmed with present staff. Received instructions for my participation in the procedure from the performing physician.  

## 2021-11-13 NOTE — Patient Instructions (Signed)
Please read handouts provided. Continue present medications. Await pathology results. Resume previous diet.   YOU HAD AN ENDOSCOPIC PROCEDURE TODAY AT THE [facility]:   Refer to the procedure report that was given to you for any specific questions about what was found during the examination.  If the procedure report does not answer your questions, please call your gastroenterologist to clarify.  If you requested that your care partner not be given the details of your procedure findings, then the procedure report has been included in a sealed envelope for you to review at your convenience later.  YOU SHOULD EXPECT: Some feelings of bloating in the abdomen. Passage of more gas than usual.  Walking can help get rid of the air that was put into your GI tract during the procedure and reduce the bloating. If you had a lower endoscopy (such as a colonoscopy or flexible sigmoidoscopy) you may notice spotting of blood in your stool or on the toilet paper. If you underwent a bowel prep for your procedure, you may not have a normal bowel movement for a few days.  Please Note:  You might notice some irritation and congestion in your nose or some drainage.  This is from the oxygen used during your procedure.  There is no need for concern and it should clear up in a day or so.  SYMPTOMS TO REPORT IMMEDIATELY:  Following lower endoscopy (colonoscopy or flexible sigmoidoscopy):  Excessive amounts of blood in the stool  Significant tenderness or worsening of abdominal pains  Swelling of the abdomen that is new, acute  Fever of 100F or higher  For urgent or emergent issues, a gastroenterologist can be reached at any hour by calling [phone]. Do not use MyChart messaging for urgent concerns.    DIET:  We do recommend a small meal at first, but then you may proceed to your regular diet.  Drink plenty of fluids but you should avoid alcoholic beverages for 24 hours.  ACTIVITY:  You should  plan to take it easy for the rest of today and you should NOT DRIVE or use heavy machinery until tomorrow (because of the sedation medicines used during the test).    FOLLOW UP: Our staff will call the number listed on your records the next business day following your procedure.  We will call around 7:15- 8:00 am to check on you and address any questions or concerns that you may have regarding the information given to you following your procedure. If we do not reach you, we will leave a message.     If any biopsies were taken you will be contacted by phone or by letter within the next 1-3 weeks.  Please call us at [phone] if you have not heard about the biopsies in 3 weeks.    SIGNATURES/CONFIDENTIALITY: You and/or your care partner have signed paperwork which will be entered into your electronic medical record.  These signatures attest to the fact that that the information above on your After Visit Summary has been reviewed and is understood.  Full responsibility of the confidentiality of this discharge information lies with you and/or your care-partner. 

## 2021-11-13 NOTE — Progress Notes (Signed)
PT taken to PACU. Monitors in place. VSS. Report given to RN. 

## 2021-11-13 NOTE — Progress Notes (Signed)
[facility] History and Physical   Primary Care Physician:  Isaac Bliss, Rayford Halsted, MD   Reason for Procedure:   Colon cancer screening  Plan:    Screening colonoscopy     HPI: John [name] is a 71 y.o. male undergoing average risk screening colonoscopy.  He has no family history of colon cancer and no chronic GI symptoms. He had a normal colonoscopy in 2013 and 2006.   Past Medical History:  Diagnosis Date   Cataract    Chronic constipation    ED (erectile dysfunction)    Family history of coronary artery disease    Hemorrhoids    History of colon polyps 2006   History of hepatitis A age 81   History of syncope    per pt hx syncopy episodes throughout life--- consultation w/ dr Marlou Porch (cardiologist)--  negative stress test  (per lov note 04-19-2014  vasovagal/ situtional )   Hyperplasia of prostate with lower urinary tract symptoms (LUTS)    Prostate cancer [facility]) urologist-  dr Louis Meckel   dx 08-23-2015--  Gleason 3+4, PSA 4.75-- active surveillance;  04/ 2021 Stage T2a, Gleason 3+4, PSA 11.9, scheduled for radiactive prostate seed implants 08-30-2019   Wears glasses    Wears glasses     Past Surgical History:  Procedure Laterality Date   CARDIOVASCULAR STRESS TEST  05-11-2013   dr Marlou Porch   Low risk nuclear study w/ small fixed defect in the inferolateral wall much more prominent on rest images c/w diaphragmatic attenuation, no evidence ishcemia/  normal LV function and wall motion, ef 62%   COLONOSCOPY  last one 08-25-2011   CYSTOSCOPY N/A 08/30/2019   Procedure: CYSTOSCOPY FLEXIBLE;  Surgeon: Ardis Hughs, MD;  Location: [facility];  Service: Urology;  Laterality: N/A;  no seeds found in bladder   PROSTATE BIOPSY N/A 01/15/2017   Procedure: BIOPSY TRANSRECTAL ULTRASONIC PROSTATE (TUBP);  Surgeon: Ardis Hughs, MD;  Location: [facility];  Service: Urology;  Laterality: N/A;   PROSTATE BIOPSY  2017   PROSTATE  BIOPSY N/A 05/18/2019   Procedure: SATURATION BIOPSY TRANSRECTAL ULTRASONIC PROSTATE (TUBP);  Surgeon: Ardis Hughs, MD;  Location: [facility];  Service: Urology;  Laterality: N/A;   RADIOACTIVE SEED IMPLANT N/A 08/30/2019   Procedure: RADIOACTIVE SEED IMPLANT/BRACHYTHERAPY IMPLANT;  Surgeon: Ardis Hughs, MD;  Location: [facility];  Service: Urology;  Laterality: N/A;   71 seeds implanted   SPACE OAR INSTILLATION N/A 08/30/2019   Procedure: SPACE OAR INSTILLATION;  Surgeon: Ardis Hughs, MD;  Location: [facility];  Service: Urology;  Laterality: N/A;   TONSILLECTOMY  age 31    Prior to Admission medications   Medication Sig Start Date End Date Taking? Authorizing Provider  Cyanocobalamin (B-12) 100 MCG TABS Take by mouth.   Yes [provider]  EQ HEMORRHOIDAL MAX ST 1-0.25-14.4-15 % CREA Apply topically daily. [employer] equate   Yes [provider]  sildenafil (REVATIO) 20 MG tablet Take 2-5 tablets by mouth as needed 04/29/15  Yes [provider]  hydrocortisone (ANUSOL-HC) 25 MG suppository PLACE 1 SUPPOSITORY (25 MG TOTAL) RECTALLY AS NEEDED FOR ITCHING. Patient not taking: Reported on 09/03/2021 09/22/16   Marletta Lor, MD    Current Outpatient Medications  Medication Sig Dispense Refill   Cyanocobalamin (B-12) 100 MCG TABS Take by mouth.     EQ HEMORRHOIDAL MAX ST 1-0.25-14.4-15 % CREA Apply topically daily. [employer] equate  sildenafil (REVATIO) 20 MG tablet Take 2-5 tablets by mouth as needed  5   hydrocortisone (ANUSOL-HC) 25 MG suppository PLACE 1 SUPPOSITORY (25 MG TOTAL) RECTALLY AS NEEDED FOR ITCHING. (Patient not taking: Reported on 09/03/2021) 12 suppository 0   Current Facility-Administered Medications  Medication Dose Route Frequency Provider Last Rate Last Admin   0.9 %  sodium chloride infusion  500 mL Intravenous Once Daryel November, MD        Allergies as  of 11/13/2021   (No Known Allergies)    Family History  Problem Relation Age of Onset   Breast cancer Mother    Heart disease Mother        CHF   Parkinsonism Mother    Heart attack Father    Colon polyps Father    Heart disease Brother        post CABG   Colon cancer Maternal Aunt    Cancer Maternal Aunt        believe she suffered from colon ca but isn't certain   Lung cancer Maternal Uncle        heavy smoker   Cancer Cousin        first cousin. type unknown.   Esophageal cancer Neg Hx    Stomach cancer Neg Hx    Rectal cancer Neg Hx     Social History   Socioeconomic History   Marital status: Married    Spouse name: Not on file   Number of children: Not on file   Years of education: Not on file   Highest education level: Not on file  Occupational History   Not on file  Tobacco Use   Smoking status: Never   Smokeless tobacco: Never  Vaping Use   Vaping Use: Never used  Substance and Sexual Activity   Alcohol use: Yes    Comment: 2 drink daily average---    Drug use: No   Sexual activity: Yes    Comment: with aid of sildenafil  Other Topics Concern   Not on file  Social History Narrative   No caffeine    Social Determinants of Health   Financial Resource Strain: Not on file  Food Insecurity: Not on file  Transportation Needs: Not on file  Physical Activity: Not on file  Stress: Not on file  Social Connections: Not on file  Intimate Partner Violence: Not on file    Review of Systems:  All other review of systems negative except as mentioned in the HPI.  Physical Exam: Vital signs BP 107/63   Pulse 62   Temp (!) 97.5 F (36.4 C)   Ht '5\' 9"'$  (1.753 m)   Wt 160 lb (72.6 kg)   SpO2 100%   BMI 23.63 kg/m   General:   Alert,  Well-developed, well-nourished, pleasant and cooperative in NAD Airway:  Mallampati 2 Lungs:  Clear throughout to auscultation.   Heart:  Regular rate and rhythm; no murmurs, clicks, rubs,  or gallops. Abdomen:  Soft,  nontender and nondistended. Normal bowel sounds.   Neuro/Psych:  Normal mood and affect. A and O x 3   Kip Kautzman E. Candis Schatz, MD [facility]

## 2021-11-14 ENCOUNTER — Telehealth: Payer: Self-pay

## 2021-11-14 NOTE — Telephone Encounter (Signed)
  Follow up Call-     11/13/2021    2:15 PM  Call back number  Post procedure Call Back phone  # [phone]  Permission to leave phone message Yes     Patient questions:  Do you have a fever, pain , or abdominal swelling? No. Pain Score  0 *  Have you tolerated food without any problems? Yes.    Have you been able to return to your normal activities? Yes.    Do you have any questions about your discharge instructions: Diet   No. Medications  No. Follow up visit  No.  Do you have questions or concerns about your Care? No.  Actions: * If pain score is 4 or above: No action needed, pain <4.

## 2021-11-25 NOTE — Progress Notes (Signed)
John [name], Only 2 of the 4 polyps removed were precancerous polyps.  This is good news.  Current guideline recommendations suggest repeating a colonoscopy in 7 years after resection of 1 or 2 precancerous polyps.  Given that you would be 71 years old at that time, the risks of further colon cancer screening likely outweigh the benefits.  Overall, your risk of colon cancer is very low.  I would recommend against further colon cancer screening.  If you would like to discuss further colon cancer screening, please make an office visit.

## 2021-12-22 DIAGNOSIS — Z23 Encounter for immunization: Secondary | ICD-10-CM | POA: Diagnosis not present

## 2022-01-05 DIAGNOSIS — Z8546 Personal history of malignant neoplasm of prostate: Secondary | ICD-10-CM | POA: Diagnosis not present

## 2022-01-05 LAB — PSA: PSA: 2.45

## 2022-01-12 DIAGNOSIS — C61 Malignant neoplasm of prostate: Secondary | ICD-10-CM | POA: Diagnosis not present

## 2022-01-12 DIAGNOSIS — N5201 Erectile dysfunction due to arterial insufficiency: Secondary | ICD-10-CM | POA: Diagnosis not present

## 2022-01-15 ENCOUNTER — Encounter: Payer: Self-pay | Admitting: Internal Medicine

## 2022-02-11 ENCOUNTER — Other Ambulatory Visit (HOSPITAL_COMMUNITY): Payer: Self-pay | Admitting: Urology

## 2022-02-11 DIAGNOSIS — C61 Malignant neoplasm of prostate: Secondary | ICD-10-CM

## 2022-02-25 ENCOUNTER — Ambulatory Visit (HOSPITAL_COMMUNITY)
Admission: RE | Admit: 2022-02-25 | Discharge: 2022-02-25 | Disposition: A | Discharge status: Home / Self Care | Payer: Medicare Other | Source: Ambulatory Visit | Attending: Urology | Admitting: Urology

## 2022-02-25 DIAGNOSIS — C61 Malignant neoplasm of prostate: Secondary | ICD-10-CM | POA: Diagnosis not present

## 2022-02-25 MED ORDER — PIFLIFOLASTAT F 18 (PYLARIFY) INJECTION
9.0000 | Freq: Once | INTRAVENOUS | Status: AC
  Administered 2022-02-25: 9.19 via INTRAVENOUS

## 2022-03-17 ENCOUNTER — Other Ambulatory Visit (HOSPITAL_COMMUNITY): Payer: Self-pay | Admitting: Urology

## 2022-03-17 ENCOUNTER — Other Ambulatory Visit: Payer: Self-pay | Admitting: Urology

## 2022-03-17 DIAGNOSIS — C61 Malignant neoplasm of prostate: Secondary | ICD-10-CM

## 2022-03-18 ENCOUNTER — Encounter (HOSPITAL_BASED_OUTPATIENT_CLINIC_OR_DEPARTMENT_OTHER): Payer: Self-pay | Admitting: Urology

## 2022-03-18 MED ORDER — FLEET ENEMA 7-19 GM/118ML RE ENEM: 1.0000 | ENEMA | Freq: Once | RECTAL | Status: AC

## 2022-03-18 NOTE — Progress Notes (Signed)
Spoke w/ via phone for pre-op interview--- John [name] needs dos----  NONE             [name] results------ COVID test -----patient states asymptomatic no test needed Arrive at -------1000 NPO after MN NO Solid Food.  Clear liquids from MN until---0900 Med rec completed Medications to take morning of surgery -----NONE Diabetic medication ----- Patient instructed no nail polish to be worn day of surgery Patient instructed to bring photo id and insurance card day of surgery Patient aware to have Driver (ride ) / caregiver  [name]  for 24 hours after surgery  Patient Special Instructions ----- Pre-Op special Istructions -----FLEETS enema per surgeon orders Patient verbalized understanding of instructions that were given at this phone interview. Patient denies shortness of breath, chest pain, fever, cough at this phone interview.

## 2022-03-26 ENCOUNTER — Encounter (HOSPITAL_BASED_OUTPATIENT_CLINIC_OR_DEPARTMENT_OTHER)
Admission: RE | Disposition: A | Discharge status: Home / Self Care | Payer: Self-pay | Source: Home / Self Care | Attending: Urology

## 2022-03-26 ENCOUNTER — Ambulatory Visit (HOSPITAL_BASED_OUTPATIENT_CLINIC_OR_DEPARTMENT_OTHER)
Admission: RE | Admit: 2022-03-26 | Discharge: 2022-03-26 | Disposition: A | Discharge status: Home / Self Care | Payer: Medicare Other | Attending: Urology | Admitting: Urology

## 2022-03-26 ENCOUNTER — Encounter (HOSPITAL_BASED_OUTPATIENT_CLINIC_OR_DEPARTMENT_OTHER): Payer: Self-pay | Admitting: Urology

## 2022-03-26 ENCOUNTER — Ambulatory Visit (HOSPITAL_BASED_OUTPATIENT_CLINIC_OR_DEPARTMENT_OTHER): Payer: Medicare Other | Admitting: Certified Registered"

## 2022-03-26 ENCOUNTER — Ambulatory Visit (HOSPITAL_COMMUNITY)
Admission: RE | Admit: 2022-03-26 | Discharge: 2022-03-26 | Disposition: A | Discharge status: Home / Self Care | Payer: Medicare Other | Source: Ambulatory Visit | Attending: Urology | Admitting: Urology

## 2022-03-26 ENCOUNTER — Ambulatory Visit (HOSPITAL_COMMUNITY): Payer: Medicare Other

## 2022-03-26 DIAGNOSIS — Z8546 Personal history of malignant neoplasm of prostate: Secondary | ICD-10-CM | POA: Diagnosis not present

## 2022-03-26 DIAGNOSIS — Z09 Encounter for follow-up examination after completed treatment for conditions other than malignant neoplasm: Secondary | ICD-10-CM | POA: Diagnosis not present

## 2022-03-26 DIAGNOSIS — N5201 Erectile dysfunction due to arterial insufficiency: Secondary | ICD-10-CM | POA: Diagnosis not present

## 2022-03-26 DIAGNOSIS — C61 Malignant neoplasm of prostate: Secondary | ICD-10-CM | POA: Insufficient documentation

## 2022-03-26 DIAGNOSIS — I771 Stricture of artery: Secondary | ICD-10-CM | POA: Diagnosis not present

## 2022-03-26 DIAGNOSIS — Z803 Family history of malignant neoplasm of breast: Secondary | ICD-10-CM | POA: Diagnosis not present

## 2022-03-26 HISTORY — PX: PROSTATE BIOPSY: SHX241

## 2022-03-26 SURGERY — BIOPSY, PROSTATE, RECTAL APPROACH, WITH US GUIDANCE
Anesthesia: Monitor Anesthesia Care | Site: Prostate

## 2022-03-26 MED ORDER — OXYCODONE HCL 5 MG PO TABS
5.0000 mg | ORAL_TABLET | ORAL | Status: DC | PRN
  Administered 2022-03-26: 5 mg via ORAL

## 2022-03-26 MED ORDER — SODIUM CHLORIDE 0.9 % IV SOLN
INTRAVENOUS | Status: AC
  Filled 2022-03-26: qty 100

## 2022-03-26 MED ORDER — LIDOCAINE HCL 2 % IJ SOLN
INTRAMUSCULAR | Status: DC | PRN
  Administered 2022-03-26: 10 mL

## 2022-03-26 MED ORDER — SODIUM CHLORIDE 0.9 % IV SOLN
2.0000 g | INTRAVENOUS | Status: AC
  Administered 2022-03-26: 2 g via INTRAVENOUS

## 2022-03-26 MED ORDER — FENTANYL CITRATE (PF) 100 MCG/2ML IJ SOLN: 25.0000 ug | INTRAMUSCULAR | Status: DC | PRN

## 2022-03-26 MED ORDER — PROPOFOL 500 MG/50ML IV EMUL
INTRAVENOUS | Status: AC
  Filled 2022-03-26: qty 50

## 2022-03-26 MED ORDER — PROPOFOL 10 MG/ML IV BOLUS
INTRAVENOUS | Status: DC | PRN
  Administered 2022-03-26: 10 mg via INTRAVENOUS
  Administered 2022-03-26: 20 mg via INTRAVENOUS

## 2022-03-26 MED ORDER — CEFTRIAXONE SODIUM 2 G IJ SOLR
INTRAMUSCULAR | Status: AC
  Filled 2022-03-26: qty 20

## 2022-03-26 MED ORDER — LACTATED RINGERS IV SOLN: INTRAVENOUS | Status: DC

## 2022-03-26 MED ORDER — PROPOFOL 500 MG/50ML IV EMUL
INTRAVENOUS | Status: DC | PRN
  Administered 2022-03-26: 75 ug/kg/min via INTRAVENOUS

## 2022-03-26 MED ORDER — OXYCODONE HCL 5 MG PO TABS
ORAL_TABLET | ORAL | Status: AC
  Filled 2022-03-26: qty 1

## 2022-03-26 MED ORDER — FENTANYL CITRATE (PF) 100 MCG/2ML IJ SOLN
INTRAMUSCULAR | Status: DC | PRN
  Administered 2022-03-26 (×5): 25 ug via INTRAVENOUS

## 2022-03-26 MED ORDER — ONDANSETRON HCL 4 MG/2ML IJ SOLN: 4.0000 mg | Freq: Once | INTRAMUSCULAR | Status: DC | PRN

## 2022-03-26 MED ORDER — EPHEDRINE SULFATE-NACL 50-0.9 MG/10ML-% IV SOSY
PREFILLED_SYRINGE | INTRAVENOUS | Status: DC | PRN
  Administered 2022-03-26: 5 mg via INTRAVENOUS

## 2022-03-26 MED ORDER — EPHEDRINE 5 MG/ML INJ
INTRAVENOUS | Status: AC
  Filled 2022-03-26: qty 5

## 2022-03-26 MED ORDER — FENTANYL CITRATE (PF) 100 MCG/2ML IJ SOLN
INTRAMUSCULAR | Status: AC
  Filled 2022-03-26: qty 2

## 2022-03-26 MED ORDER — SODIUM CHLORIDE 0.9 % IV SOLN: INTRAVENOUS | Status: DC

## 2022-03-26 SURGICAL SUPPLY — 13 items
GAUZE SPONGE 4X4 12PLY STRL (GAUZE/BANDAGES/DRESSINGS) IMPLANT
GLOVE BIO SURGEON STRL SZ7.5 (GLOVE) IMPLANT
INST BIOPSY MAXCORE 18GX25 (NEEDLE) IMPLANT
INSTR BIOPSY MAXCORE 18GX20 (NEEDLE) IMPLANT
KIT TURNOVER CYSTO (KITS) ×1 IMPLANT
NDL SAFETY ECLIP 18X1.5 (MISCELLANEOUS) IMPLANT
NDL SPNL 22GX7 QUINCKE BK (NEEDLE) IMPLANT
NEEDLE SPNL 22GX7 QUINCKE BK (NEEDLE) IMPLANT
SLEEVE SCD COMPRESS KNEE MED (STOCKING) ×1 IMPLANT
SURGILUBE 2OZ TUBE FLIPTOP (MISCELLANEOUS) ×1 IMPLANT
SYR CONTROL 10ML LL (SYRINGE) IMPLANT
TOWEL OR 17X24 6PK STRL BLUE (TOWEL DISPOSABLE) IMPLANT
UNDERPAD 30X36 HEAVY ABSORB (UNDERPADS AND DIAPERS) ×2 IMPLANT

## 2022-03-26 NOTE — Interval H&P Note (Signed)
History and Physical Interval Note:  The patient is PMS a scan demonstrated no evidence of systemic disease, but was PET avid in the prostate.  Prior to proceeding with salvage therapy I recommended a prostate biopsy.  He opted to do this under anesthesia.  There are no changes otherwise to his H&P.  03/26/2022 11:31 AM  John [name]  has presented today for surgery, with the diagnosis of PROSTATE CANCER.  The various methods of treatment have been discussed with the patient and family. After consideration of risks, benefits and other options for treatment, the patient has consented to  Procedure(s) with comments: BIOPSY TRANSRECTAL ULTRASONIC PROSTATE (TUBP) (N/A) - 30 MINS PLACE IN RM 5 AT 12:00 PM as a surgical intervention.  The patient's history has been reviewed, patient examined, no change in status, stable for surgery.  I have reviewed the patient's chart and labs.  Questions were answered to the patient's satisfaction.     Ardis Hughs

## 2022-03-26 NOTE — Op Note (Signed)
Preoperative diagnosis:  Prostate cancer   Postoperative diagnosis:  same   Procedure: Transrectal ultrasound Transrectal ultrasound-guided prostate biopsy  Surgeon: Ardis Hughs, MD  Anesthesia: General  Complications: None  Intraoperative findings: #1: The patient's prostate was measured to be approximately 30.40 g.  There was no significant median lobe.   EBL: Minimal  Specimens: The standard 12 core biopsy template was performed taking to biopsies from each section.  In addition, I took 1 sample from each seminal vesical.  I total, 14 biopsy cores were obtained.  Indication: John [name] is a 72 y.o.   with a history of biochemical recurrent prostate cancer.  He had a PMS a scan which demonstrated no evidence of metastatic disease, but probable recurrence within the prostate.  After reviewing the management options for treatment, he elected to proceed with the above surgical procedure(s). We have discussed the potential benefits and risks of the procedure, side effects of the proposed treatment, the likelihood of the patient achieving the goals of the procedure, and any potential problems that might occur during the procedure or recuperation. Informed consent has been obtained.  Description of procedure:  The patient was taken to the operating room and conscious sedation was induced.  The patient was then placed in the left lateral decubitus position.  Timeout was then performed.  The transrectal ultrasound was then inserted and a transrectal ultrasound was performed in the routine fashion.  His volume was measured to be approximately 30.40 g.  The findings were as described above.  I then instilled a total of 10 cc of 1% lidocaine and the prostatic-seminal vesicle angle bilaterally as well as the apex bilaterally.  I then performed a 12 core biopsy template per routine, and obtained to biopsy specimens in each location.  I took 1 additional biopsy at each of the seminal  vesicles.  The patient was subsequently awoken and returned to the PACU in stable condition.  Ardis Hughs, M.D.

## 2022-03-26 NOTE — H&P (Signed)
/u to monitor Prostate Cancer  HPI: John [name] is a 72 year-old male established patient who is here for interval evaluation of his prostate cancer.  The patient was last seen June 2023.   He was diagnosed with prostate cancer in 07/01/2019. The patient was treated with brachytherapy. The patient started/underwent treatemt on 08/2019. The patient's gleason score was: 3+4. Pretreatment PSA: 11.90.   The patient's most recent PSA was 2.43. This was drawn on approximately 01/05/2022.   PSA History: 6/23: 2.04, 11/22: 1.09, 5/22: 0.89.   He does have problems with erections. Currently he is using Sildenafil 40 mg for his ED. His erections are satisfactory for intercouse.   The patient denies having urinary incontinence. The patient denies any new bone pain, new back pain, or lower extremity edema. The patient has developed urgency and intermittency.   Patient's symptoms have nearly resolved, and he is now back to his baseline voiding symptoms. He is no longer taking tamsulosin. He continues to get erections with 20-40 mg of sildenafil.   Interval: Today the patient is here for follow-up and has no real significant urinary tract complaints. Denies any hematuria or dysuria. Erectile function stable. The patient denies any new bone or back pain. He denies any lower extremity edema.     ALLERGIES: No Allergies    MEDICATIONS: Sildenafil Citrate 20 mg tablet 2-5 tablets as needed prior to intimacy  Triamcinolone Acetonide 0.1 % cream External  Vitamin B12     GU PSH: Prostate Needle Biopsy - 2018, 2017 Prostate Saturation Sampling - 2021 TRANSPERI NEEDLE PLACE, PROS - 2021 Transperineal Plmt Biodegradable Matrl 1/Mlt Njx - 2021       PSH Notes: Complete Colonoscopy, Tonsillectomy   NON-GU PSH: Diagnostic Colonoscopy - 2011 Remove Tonsils - 2011 Surgical Pathology, Gross And Microscopic Examination For Prostate Needle - 2017     GU PMH: ED due to arterial insufficiency - 07/14/2021,  - 01/07/2021, - 2021, Erectile dysfunction due to arterial insufficiency, - 2017 History of prostate cancer - 07/14/2021, - 01/07/2021, - 2022, - 2021 Post-void dribbling - 2021, - 2020 Prostate Cancer - 2020, - 2020, - 2019, - 2018, - 2018, - 2018, The patient is on active surveillance for his prostate cancer. His PSA did rise over the last 6 months which is concerning but at this point we will continue to monitor closely., - 2018, - 2017 Elevated PSA, Elevated prostate specific antigen (PSA) - 2017 BPH w/LUTS, Benign localized prostatic hyperplasia with lower urinary tract symptoms (LUTS) - 2017 BPH w/o LUTS, BPH without urinary obstruction - 2016 Hematospermia, Hematospermia - 2016    NON-GU PMH: Encounter for general adult medical examination without abnormal findings, Encounter for preventive health examination - 2017 Personal history of other infectious and parasitic diseases, History of hepatitis - 2014    FAMILY HISTORY: Breast Cancer - Runs In Family Family Health Status Number - Runs In Family Father Deceased At Age29 ___ - Runs In Family Mother Deceased At Age 35 from diabetic complicati - Runs In Family No pertinent family history - Other   SOCIAL HISTORY: Marital Status: Married Preferred Language: English; Ethnicity: Not Hispanic Or Latino; Race: White Current Smoking Status: Patient has never smoked.   Tobacco Use Assessment Completed: Used Tobacco in last 30 days? Does not use smokeless tobacco. Does drink.  Does not use drugs. Drinks 1 caffeinated drink per day. Has not had a blood transfusion.     Notes: Never A Smoker, Caffeine Use, Marital History - Currently Married,  Alcohol Use, Tobacco Use  1 beer per day   REVIEW OF SYSTEMS:    GU Review Male:   Patient denies frequent urination, hard to postpone urination, burning/ pain with urination, get up at night to urinate, leakage of urine, stream starts and stops, trouble starting your stream, have to strain to urinate  , erection problems, and penile pain.  Gastrointestinal (Upper):   Patient denies nausea, vomiting, and indigestion/ heartburn.  Gastrointestinal (Lower):   Patient denies diarrhea and constipation.  Constitutional:   Patient denies fever, night sweats, weight loss, and fatigue.  Skin:   Patient denies skin rash/ lesion and itching.  Eyes:   Patient denies blurred vision and double vision.  Ears/ Nose/ Throat:   Patient denies sinus problems and sore throat.  Hematologic/Lymphatic:   Patient denies swollen glands and easy bruising.  Cardiovascular:   Patient denies leg swelling and chest pains.  Respiratory:   Patient denies cough and shortness of breath.  Endocrine:   Patient denies excessive thirst.  Musculoskeletal:   Patient denies back pain and joint pain.  Neurological:   Patient denies headaches and dizziness.  Psychologic:   Patient denies depression and anxiety.   VITAL SIGNS: None   Complexity of Data:  Source Of History:  Patient  Lab Test Review:   PSA  Records Review:   Pathology Reports, Previous Doctor Records, Previous Patient Records, POC Tool  Urine Test Review:   Urinalysis   01/05/22 07/07/21 12/31/20 06/25/20 12/25/19 03/29/19 08/09/18 01/17/18  PSA  Total PSA 2.45 ng/mL 2.04 ng/mL 1.08 ng/mL 0.89 ng/mL 2.99 ng/mL 11.90 ng/mL 8.64 ng/mL 7.97 ng/mL  Free PSA        0.72 ng/mL  % Free PSA        9 % PSA    PROCEDURES:          Urinalysis - 81003 Dipstick Dipstick Cont'd  Specimen: Voided Bilirubin: Neg  Color: Yellow Ketones: Neg  Appearance: Clear Protein: Neg  Specific Gravity: 1.020 Urobilinogen: 0.2  pH: 5.0 Nitrites: Neg  Glucose: Neg Leukocyte Esterase: Neg    ASSESSMENT:      ICD-10 Details  1 GU:   Rising PSA after prostate cancer treatment - R97.21   2   ED due to arterial insufficiency - N52.01      PLAN:           Orders X-Rays: PET- PSMA Scan          Schedule Return Visit/Planned Activity: 4-6 Weeks - Office Visit           Document Letter(s):  Created for Patient: Clinical Summary         Notes:   Patient's PSA continues to rise, and as such I have recommended that we proceed with a PMSA scan. Our concern is that the patient has had recurrence of his prostate cancer. Once we get the PMSA scan we will follow-up for further discussion and treatment options. I will quickly call him to review these results with him initially.   Fortunately the patient has pretty stable erectile function using sildenafil. He has no other voiding symptoms.

## 2022-03-26 NOTE — Transfer of Care (Signed)
Immediate Anesthesia Transfer of Care Note  Patient: John [name]  Procedure(s) Performed: BIOPSY TRANSRECTAL ULTRASONIC PROSTATE (TUBP) (Prostate)  Patient Location: PACU  Anesthesia Type:MAC  Level of Consciousness: awake, alert , and oriented  Airway & Oxygen Therapy: Patient Spontanous Breathing and Patient connected to face mask oxygen  Post-op Assessment: Report given to RN and Post -op Vital signs reviewed and stable  Post vital signs: Reviewed and stable  Last Vitals:  Vitals Value Taken Time  BP    Temp    Pulse    Resp    SpO2      Last Pain:  Vitals:   03/26/22 1019  TempSrc: Oral  PainSc: 0-No pain      Patients Stated Pain Goal: 4 ([id number])  Complications: No notable events documented.

## 2022-03-26 NOTE — Anesthesia Preprocedure Evaluation (Signed)
Anesthesia Evaluation  Patient identified by MRN, date of birth, ID band Patient awake    Reviewed: Allergy & Precautions, H&P , NPO status , Patient's Chart, lab work & pertinent test results  Airway Mallampati: II  TM Distance: >3 FB Neck ROM: Full    Dental no notable dental hx.    Pulmonary neg pulmonary ROS   Pulmonary exam normal breath sounds clear to auscultation       Cardiovascular negative cardio ROS Normal cardiovascular exam Rhythm:Regular Rate:Normal     Neuro/Psych negative neurological ROS  negative psych ROS   GI/Hepatic negative GI ROS, Neg liver ROS,,,  Endo/Other  negative endocrine ROS    Renal/GU negative Renal ROS  negative genitourinary   Musculoskeletal negative musculoskeletal ROS (+)    Abdominal   Peds negative pediatric ROS (+)  Hematology negative hematology ROS (+)   Anesthesia Other Findings   Reproductive/Obstetrics negative OB ROS                             Anesthesia Physical Anesthesia Plan  ASA: 2  Anesthesia Plan: MAC   Post-op Pain Management: Minimal or no pain anticipated   Induction: Intravenous  PONV Risk Score and Plan: 1 and Propofol infusion and Treatment may vary due to age or medical condition  Airway Management Planned: Simple Face Mask  Additional Equipment:   Intra-op Plan:   Post-operative Plan:   Informed Consent: I have reviewed the patients History and Physical, chart, labs and discussed the procedure including the risks, benefits and alternatives for the proposed anesthesia with the patient or authorized representative who has indicated his/her understanding and acceptance.     Dental advisory given  Plan Discussed with: CRNA and Surgeon  Anesthesia Plan Comments:        Anesthesia Quick Evaluation

## 2022-03-26 NOTE — Discharge Instructions (Addendum)
For several days the patient:  should increase his fluid intake and limit strenuous activity. he might have mild discomfort at the base of his penis or in his rectum. he might have blood in his urine or blood in his bowel movements.  For 2-3 months he might have blood in his ejaculate (semen).  Call the office immedicately:  for blood clots in the urine or bowel movements,  difficulty urinating,  inability to urinate,  urinary retention,  painful or frequent urination,  fever, chills,  nausea, vomiting, other illness.    The prostate biopsy pathology reports are usually available within 3-5 working days, unless a pathologic second opinion is required, which may take 7-14 days.   [facility] to check on the status of his biopsy if he has not heard from [place] within 7 days.  [facility]:  [phone]        Post Anesthesia Home Care Instructions  Activity: Get plenty of rest for the remainder of the day. A responsible individual must stay with you for 24 hours following the procedure.  For the next 24 hours, DO NOT: -Drive a car -Paediatric nurse -Drink alcoholic beverages -Take any medication unless instructed by your physician -Make any legal decisions or sign important papers.  Meals: Start with liquid foods such as gelatin or soup. Progress to regular foods as tolerated. Avoid greasy, spicy, heavy foods. If nausea and/or vomiting occur, drink only clear liquids until the nausea and/or vomiting subsides. Call your physician if vomiting continues.  Special Instructions/Symptoms: Your throat may feel dry or sore from the anesthesia or the breathing tube placed in your throat during surgery. If this causes discomfort, gargle with warm salt water. The discomfort should disappear within 24 hours.

## 2022-03-26 NOTE — Anesthesia Postprocedure Evaluation (Signed)
Anesthesia Post Note  Patient: John [name]  Procedure(s) Performed: BIOPSY TRANSRECTAL ULTRASONIC PROSTATE (TUBP) (Prostate)     Patient location during evaluation: PACU Anesthesia Type: MAC Level of consciousness: awake and alert Pain management: pain level controlled Vital Signs Assessment: post-procedure vital signs reviewed and stable Respiratory status: spontaneous breathing, nonlabored ventilation, respiratory function stable and patient connected to nasal cannula oxygen Cardiovascular status: stable and blood pressure returned to baseline Postop Assessment: no apparent nausea or vomiting Anesthetic complications: no  No notable events documented.  Last Vitals:  Vitals:   03/26/22 1230 03/26/22 1300  BP: 105/66 117/74  Pulse: 66 71  Resp: 20 16  Temp:  36.5 C  SpO2: 99% 99%    Last Pain:  Vitals:   03/26/22 1300  TempSrc:   PainSc: 0-No pain                 Chyenne Sobczak S

## 2022-03-27 ENCOUNTER — Encounter (HOSPITAL_BASED_OUTPATIENT_CLINIC_OR_DEPARTMENT_OTHER): Payer: Self-pay | Admitting: Urology

## 2022-03-28 LAB — SURGICAL PATHOLOGY

## 2022-04-13 DIAGNOSIS — N5201 Erectile dysfunction due to arterial insufficiency: Secondary | ICD-10-CM | POA: Diagnosis not present

## 2022-04-13 DIAGNOSIS — C61 Malignant neoplasm of prostate: Secondary | ICD-10-CM | POA: Diagnosis not present

## 2022-04-30 ENCOUNTER — Ambulatory Visit
Admission: RE | Admit: 2022-04-30 | Discharge: 2022-04-30 | Disposition: A | Discharge status: Home / Self Care | Payer: Medicare Other | Source: Ambulatory Visit | Attending: Radiation Oncology | Admitting: Radiation Oncology

## 2022-04-30 ENCOUNTER — Ambulatory Visit
Admission: RE | Admit: 2022-04-30 | Discharge: 2022-04-30 | Disposition: A | Discharge status: Home / Self Care | Payer: Medicare Other | Source: Ambulatory Visit | Attending: Urology | Admitting: Urology

## 2022-04-30 ENCOUNTER — Other Ambulatory Visit: Payer: Self-pay

## 2022-04-30 ENCOUNTER — Encounter: Payer: Self-pay | Admitting: Urology

## 2022-04-30 VITALS — BP 116/79 | HR 62 | Temp 97.9°F | Resp 18 | Ht 69.0 in | Wt 162.4 lb

## 2022-04-30 DIAGNOSIS — C61 Malignant neoplasm of prostate: Secondary | ICD-10-CM

## 2022-04-30 DIAGNOSIS — Z801 Family history of malignant neoplasm of trachea, bronchus and lung: Secondary | ICD-10-CM | POA: Diagnosis not present

## 2022-04-30 DIAGNOSIS — Z86010 Personal history of colonic polyps: Secondary | ICD-10-CM | POA: Insufficient documentation

## 2022-04-30 DIAGNOSIS — R9721 Rising PSA following treatment for malignant neoplasm of prostate: Secondary | ICD-10-CM | POA: Diagnosis not present

## 2022-04-30 DIAGNOSIS — Z923 Personal history of irradiation: Secondary | ICD-10-CM | POA: Insufficient documentation

## 2022-04-30 DIAGNOSIS — Z800 Family history of malignant neoplasm of digestive organs: Secondary | ICD-10-CM | POA: Insufficient documentation

## 2022-04-30 DIAGNOSIS — Z803 Family history of malignant neoplasm of breast: Secondary | ICD-10-CM | POA: Insufficient documentation

## 2022-04-30 DIAGNOSIS — Z79899 Other long term (current) drug therapy: Secondary | ICD-10-CM | POA: Diagnosis not present

## 2022-04-30 NOTE — Progress Notes (Addendum)
Reconsult nursing interview for Adenocarcinoma of the prostate. Patient identity verified. Patient reports doing well. No discomfort at this time.  Meaningful use complete. I-PSS score of 4- mild SHIM score- 7 Urinary management medications- None Urology appt- None currently w/ Dr. Louis Meckel at [facility]  Vitals- BP 116/79 (BP Location: Left Arm, Patient Position: Sitting, Cuff Size: Large)   Pulse 62   Temp 97.9 F (36.6 C) (Temporal)   Resp 18   Ht 5\' 9"  (1.753 m)   Wt 162 lb 6.4 oz (73.7 kg)   SpO2 98%   BMI 23.98 kg/m    This concludes the interview.   Leandra Kern, LPN

## 2022-04-30 NOTE — Progress Notes (Signed)
Radiation Oncology         (336) [phone] ________________________________  Outpatient Re-Consultation  Name: John [name] MRN: [id number]  Date: 04/30/2022  DOB: 1950/05/13  [id number] [name], MD  Ardis Hughs, MD   REFERRING PHYSICIAN: Ardis Hughs, MD  DIAGNOSIS: 72 y.o. gentleman with a biochemical recurrence of prostate cancer with posttreatment PSA of 2.45 s/p brachytherapy for stage T2a adenocarcinoma of the prostate with Gleason Score of 3+4, and PSA of 11.9.    ICD-10-CM   1. Malignant neoplasm of prostate ([place])  C61     2. Biochemically recurrent malignant neoplasm of prostate [facility])  C61    R97.21       HISTORY OF PRESENT ILLNESS: John [name] is a 72 y.o. male with a diagnosis of prostate cancer. He was initially referred to Dr. Louis Meckel in 06/2015 for an elevated PSA of 4.26. A prostate nodule was noted on DRE.  Therefore, proceeded to prostate MRI on 07/19/2015 showing a 10 mm anterior left mid to apical gland nodule without pelvic adenopathy or evidence of bone metastasis. An MRI fusion biopsy was performed on 08/23/2015 and confirme low volume (10%) Gleason 3+4 prostate cancer in 1/17 cores. All ROI samples were benign.  After a detailed discussion about treatment options, he elected to proceed in active surveillance and has been followed closely since that time  His PSA increased to 5.85 in 01/2017 and a surveillance biopsy performed on 01/15/2017 showed only low volume (5%) Gleason 3+3 disease in 1/12 cores, in the left apex nodule and he therefore elected to continue with active surveillance.  His PSA remained stable between 5.5 and 6 until June 2019 when it was noted further increased at 6.69.  Since that time, the PSA has continued to rise at 7.97 in December 2019 and 8.64 in July 2020.  His most recent PSA was 11.9 in 03/2019.  Therefore, he proceeded to surveillance prostate MRI on 05/02/2019, which revealed progression of the dominant left  anterior mid to apical gland nodule with findings consistent with trans-capsular spread but no pelvic adenopathy or evidence of bony metastasis.  The patient subsequently had a saturation biopsy of the prostate under anesthesia on 05/18/2019.  The prostate volume measured 34 cc.  Out of 31 core biopsies, 6 were positive.  The maximum Gleason score was 3+4, and this was seen in the left apex (1/2 cores), left mid lateral (1/2 cores, small focus), and left apex ROI (3/3 cores).  Additionally, a small focus of Gleason 3+3 was noted in one of the samples from the left anterior gland.  Given evidence of grade progression on recent biopsy, we met back with the patient on 06/20/19 to discuss treatment options and he elected to proceed with brachytherapy and SpaceOAR gel placement which was performed on 08/30/2019.  His PSA nadired at 0.89 in May 2022 but has slowly risen since that time.  The PSA was 1.08 in November 2022, 2.04 in June 2023 and most recently 2.45 in December 2023.  A PSMA PET scan was performed on 02/25/2022 for disease restaging and showed focal activity in the left apex, concerning for disease local recurrence but no evidence of visceral or skeletal metastatic disease.  He had a repeat biopsy under anesthesia on 03/26/2022 with final pathology confirming all 14 cores had benign prostatic tissue with radiation atypia only.  The patient reviewed the biopsy and imaging results with his urologist and he has kindly been referred today for discussion of potential salvage  radiation treatment options.   PREVIOUS RADIATION THERAPY: Yes 08/30/2019:  Insertion of radioactive I-125 seeds into the prostate gland; 145 Gy, definitive therapy with placement of SpaceOAR VUE gel.   PAST MEDICAL HISTORY:  Past Medical History:  Diagnosis Date   Cataract    Chronic constipation    ED (erectile dysfunction)    Family history of coronary artery disease    Hemorrhoids    History of colon polyps 2006   History of  hepatitis A age 43   History of syncope    per pt hx syncopy episodes throughout life--- consultation w/ dr Marlou Porch (cardiologist)--  negative stress test  (per lov note 04-19-2014  vasovagal/ situtional )   Hyperplasia of prostate with lower urinary tract symptoms (LUTS)    Prostate cancer [facility]) urologist-  dr Louis Meckel   dx 08-23-2015--  Gleason 3+4, PSA 4.75-- active surveillance;  04/ 2021 Stage T2a, Gleason 3+4, PSA 11.9, scheduled for radiactive prostate seed implants 08-30-2019   Wears glasses    Wears glasses       PAST SURGICAL HISTORY: Past Surgical History:  Procedure Laterality Date   CARDIOVASCULAR STRESS TEST  05-11-2013   dr Marlou Porch   Low risk nuclear study w/ small fixed defect in the inferolateral wall much more prominent on rest images c/w diaphragmatic attenuation, no evidence ishcemia/  normal LV function and wall motion, ef 62%   COLONOSCOPY  last one 08-25-2011   CYSTOSCOPY N/A 08/30/2019   Procedure: CYSTOSCOPY FLEXIBLE;  Surgeon: Ardis Hughs, MD;  Location: [facility];  Service: Urology;  Laterality: N/A;  no seeds found in bladder   PROSTATE BIOPSY N/A 01/15/2017   Procedure: BIOPSY TRANSRECTAL ULTRASONIC PROSTATE (TUBP);  Surgeon: Ardis Hughs, MD;  Location: [facility];  Service: Urology;  Laterality: N/A;   PROSTATE BIOPSY  2017   PROSTATE BIOPSY N/A 05/18/2019   Procedure: SATURATION BIOPSY TRANSRECTAL ULTRASONIC PROSTATE (TUBP);  Surgeon: Ardis Hughs, MD;  Location: [facility];  Service: Urology;  Laterality: N/A;   PROSTATE BIOPSY N/A 03/26/2022   Procedure: BIOPSY TRANSRECTAL ULTRASONIC PROSTATE (TUBP);  Surgeon: Ardis Hughs, MD;  Location[facility];  Service: Urology;  Laterality: N/A;  30 MINS PLACE IN RM 5 AT 12:00 PM   RADIOACTIVE SEED IMPLANT N/A 08/30/2019   Procedure: RADIOACTIVE SEED IMPLANT/BRACHYTHERAPY IMPLANT;  Surgeon: Ardis Hughs, MD;  Location:  [facility];  Service: Urology;  Laterality: N/A;   73 seeds implanted   SPACE OAR INSTILLATION N/A 08/30/2019   Procedure: SPACE OAR INSTILLATION;  Surgeon: Ardis Hughs, MD;  Location: [facility];  Service: Urology;  Laterality: N/A;   TONSILLECTOMY  age 69    FAMILY HISTORY:  Family History  Problem Relation Age of Onset   Breast cancer Mother    Heart disease Mother        CHF   Parkinsonism Mother    Heart attack Father    Colon polyps Father    Heart disease Brother        post CABG   Colon cancer Maternal Aunt    Cancer Maternal Aunt        believe she suffered from colon ca but isn't certain   Lung cancer Maternal Uncle        heavy smoker   Cancer Cousin        first cousin. type unknown.   Esophageal cancer Neg Hx    Stomach cancer  Neg Hx    Rectal cancer Neg Hx     SOCIAL HISTORY:  Social History   Socioeconomic History   Marital status: Married    Spouse name: Not on file   Number of children: Not on file   Years of education: Not on file   Highest education level: Not on file  Occupational History   Not on file  Tobacco Use   Smoking status: Never   Smokeless tobacco: Never  Vaping Use   Vaping Use: Never used  Substance and Sexual Activity   Alcohol use: Yes    Comment: 2 drink daily average---    Drug use: No   Sexual activity: Yes    Comment: with aid of sildenafil  Other Topics Concern   Not on file  Social History Narrative   No caffeine    Social Determinants of Health   Financial Resource Strain: Not on file  Food Insecurity: No Food Insecurity (04/30/2022)   Hunger Vital Sign    Worried About Running Out of Food in the Last Year: Never true    Ran Out of Food in the Last Year: Never true  Transportation Needs: No Transportation Needs (04/30/2022)   PRAPARE - Hydrologist (Medical): No    Lack of Transportation (Non-Medical): No  Physical Activity: Not on file  Stress:  Not on file  Social Connections: Not on file  Intimate Partner Violence: Not At Risk (04/30/2022)   Humiliation, Afraid, Rape, and Kick questionnaire    Fear of Current or Ex-Partner: No    Emotionally Abused: No    Physically Abused: No    Sexually Abused: No    ALLERGIES: Patient has no known allergies.  MEDICATIONS:  Current Outpatient Medications  Medication Sig Dispense Refill   Cyanocobalamin (B-12) 100 MCG TABS Take by mouth.     docusate sodium (COLACE) 250 MG capsule Take 250 mg by mouth 2 (two) times daily.     EQ HEMORRHOIDAL MAX ST 1-0.25-14.4-15 % CREA Apply topically daily. [employer] equate     hydrocortisone (ANUSOL-HC) 25 MG suppository PLACE 1 SUPPOSITORY (25 MG TOTAL) RECTALLY AS NEEDED FOR ITCHING. (Patient not taking: Reported on 09/03/2021) 12 suppository 0   sildenafil (REVATIO) 20 MG tablet Take 2-5 tablets by mouth as needed  5   No current facility-administered medications for this encounter.   Facility-Administered Medications Ordered in Other Encounters  Medication Dose Route Frequency Provider Last Rate Last Admin   sodium phosphate (FLEET) 7-19 GM/118ML enema 1 enema  1 enema Rectal Once Ardis Hughs, MD        REVIEW OF SYSTEMS:  On review of systems, the patient reports that he is doing well overall. He denies any chest pain, shortness of breath, cough, fevers, chills, night sweats, unintended weight changes. He denies any bowel disturbances, and denies abdominal pain, nausea or vomiting. He denies any new musculoskeletal or joint aches or pains. His IPSS was 4, indicating mild urinary symptoms. He reports some mild post-void dribble. He notes a history of chronic hemorrhoids, which causes rectal discomfort/pressure. His SHIM was 25 with sildenafil, indicating he has well-controlled erectile dysfunction. A complete review of systems is obtained and is otherwise negative.    PHYSICAL EXAM:  Wt Readings from Last 3 Encounters:  04/30/22 162 lb 6.4  oz (73.7 kg)  03/26/22 162 lb 8 oz (73.7 kg)  11/13/21 160 lb (72.6 kg)   Temp Readings from Last 3 Encounters:  04/30/22 97.9  F (36.6 C) (Temporal)  03/26/22 97.7 F (36.5 C)  11/13/21 (!) 97.5 F (36.4 C)   BP Readings from Last 3 Encounters:  04/30/22 116/79  03/26/22 117/74  11/13/21 119/70   Pulse Readings from Last 3 Encounters:  04/30/22 62  03/26/22 71  11/13/21 (!) 58   Pain Assessment Pain Score: 0-No pain/10  In general this is a well appearing Caucasian man in no acute distress.  He's alert and oriented x4 and appropriate throughout the examination. Cardiopulmonary assessment is negative for acute distress and he exhibits normal effort.   KPS = 100  100 - Normal; no complaints; no evidence of disease. 90   - Able to carry on normal activity; minor signs or symptoms of disease. 80   - Normal activity with effort; some signs or symptoms of disease. 57   - Cares for self; unable to carry on normal activity or to do active work. 60   - Requires occasional assistance, but is able to care for most of his personal needs. 50   - Requires considerable assistance and frequent medical care. 96   - Disabled; requires special care and assistance. 34   - Severely disabled; hospital admission is indicated although death not imminent. 34   - Very sick; hospital admission necessary; active supportive treatment necessary. 10   - Moribund; fatal processes progressing rapidly. 0     - Dead  Karnofsky DA, Abelmann Goodnight, Craver LS and Burchenal [facility] [phone]) The use of the nitrogen mustards in the palliative treatment of carcinoma: with particular reference to bronchogenic carcinoma Cancer 1 634-56  LABORATORY DATA:  Lab Results  Component Value Date   WBC 4.0 09/03/2021   HGB 14.9 09/03/2021   HCT 44.3 09/03/2021   MCV 92.8 09/03/2021   PLT 153.0 09/03/2021   Lab Results  Component Value Date   NA 140 09/03/2021   K 4.6 09/03/2021   CL 103 09/03/2021   CO2 29 09/03/2021    Lab Results  Component Value Date   ALT 12 09/03/2021   AST 16 09/03/2021   ALKPHOS 70 09/03/2021   BILITOT 0.8 09/03/2021     RADIOGRAPHY: No results found.    IMPRESSION/PLAN:  1. 72 y.o. gentleman with a biochemical recurrence of prostate cancer with posttreatment PSA of 2.45 s/p brachytherapy for stage T2a adenocarcinoma of the prostate with Gleason Score of 3+4, and PSA of 11.9. We discussed the patient's workup and outlined the nature of prostate cancer in this setting. The patient's rising post-treatment PSA and PSMA PET findings, particularly when compared to his post-implant CT-SIM that shows a lack of seeds in the left mid apex, are highly suggestive of a local recurrence. However, his most recent biopsy from 03/26/22 showed only benign prostatic tissue with radiation atypia in all 14 samples.  We discussed the options for a repeat saturation biopsy, possibly fusing in the PSMA PET images and focused sampling of the PET positive region versus proceeding with salvage therapy.  We discussed the available radiation techniques, and focused on the details and logistics of focused stereotactic body radiation (SBRT). We discussed and outlined the risks, benefits, short and long-term effects associated with re-irradiation of the prostate, including but not limited to urethral stricture, and compared and contrasted these with post-radiation prostatectomy.  He was encouraged to ask questions that were answered to his stated satisfaction.  At the end of the conversation, the patient is in favor of having his case presented at the upcoming multidisciplinary urology oncology  conference on 05/15/2022 for consensus recommendation and then come back together thereafter to make a decision regarding proceeding with salvage SBRT versus repeat biopsy. We enjoyed meeting with him again today.  We will share our discussion with Dr. Louis Meckel and look forward to continuing to follow his progress via correspondence.   He knows that he is welcome to call at anytime in the interim with any questions or concerns related to the radiation options discussed today.   We personally spent 60 minutes in this encounter including chart review, reviewing radiological studies, meeting face-to-face with the patient, entering orders and completing documentation.    Nicholos Johns, PA-C    Tyler Pita, MD  [facility] Direct Dial: [phone]  Fax: [phone] [email or web address]  Skype  LinkedIn

## 2022-05-18 NOTE — Progress Notes (Signed)
RN left message with patient for call back to review treatment recommendations post urology conference on 4/12.

## 2022-05-20 ENCOUNTER — Ambulatory Visit
Admission: RE | Admit: 2022-05-20 | Discharge: 2022-05-20 | Disposition: A | Discharge status: Home / Self Care | Payer: Medicare Other | Source: Ambulatory Visit | Attending: Radiation Oncology | Admitting: Radiation Oncology

## 2022-05-20 DIAGNOSIS — R9721 Rising PSA following treatment for malignant neoplasm of prostate: Secondary | ICD-10-CM | POA: Diagnosis not present

## 2022-05-20 DIAGNOSIS — C61 Malignant neoplasm of prostate: Secondary | ICD-10-CM | POA: Diagnosis not present

## 2022-05-21 DIAGNOSIS — C61 Malignant neoplasm of prostate: Secondary | ICD-10-CM | POA: Diagnosis not present

## 2022-05-21 DIAGNOSIS — R9721 Rising PSA following treatment for malignant neoplasm of prostate: Secondary | ICD-10-CM | POA: Diagnosis not present

## 2022-05-21 NOTE — Progress Notes (Signed)
  Radiation Oncology         [phone] ________________________________  Name: John [name] MRN: [id number]  Date: 05/20/2022  DOB: 16-Aug-1950  STEREOTACTIC BODY RADIOTHERAPY SIMULATION AND TREATMENT PLANNING NOTE    ICD-10-CM   1. Malignant neoplasm of prostate  C61     2. Biochemically recurrent malignant neoplasm of prostate  C61    R97.21       DIAGNOSIS:  72 y.o. gentleman with a local recurrence of prostate cancer with posttreatment PSA of 2.45 s/p brachytherapy for stage T2a adenocarcinoma of the prostate with Gleason Score of 3+4, and PSA of 11.9.  NARRATIVE:  The patient was brought to the CT Simulation planning suite.  Identity was confirmed.  All relevant records and images related to the planned course of therapy were reviewed.  The patient freely provided informed written consent to proceed with treatment after reviewing the details related to the planned course of therapy. The consent form was witnessed and verified by the simulation staff.  Then, the patient was set-up in a stable reproducible  supine position for radiation therapy.  A BodyFix immobilization pillow was fabricated for reproducible positioning.  Surface markings were placed.  The CT images were loaded into the planning software.  The gross target volumes (GTV) and planning target volumes (PTV) were delinieated, and avoidance structures were contoured.  Treatment planning then occurred.  The radiation prescription was entered and confirmed.  A total of two complex treatment devices were fabricated in the form of the BodyFix immobilization pillow and a neck accuform cushion.  I have requested : 3D Simulation  I have requested a DVH of the following structures: targets and all normal structures near the target including rectum, bladder, bowel and urethra as noted on the radiation plan to maintain doses in adherence with established limits  SPECIAL TREATMENT PROCEDURE:  The planned course of therapy using radiation  constitutes a special treatment procedure. Special care is required in the management of this patient for the following reasons. High dose per fraction requiring special monitoring for increased toxicities of treatment including daily imaging..  The special nature of the planned course of radiotherapy will require increased physician supervision and oversight to ensure patient's safety with optimal treatment outcomes.    This requires extended time and effort.    PLAN:  The patient will receive 36.25 Gy in 5 fractions.  ________________________________  Artist Pais Kathrynn Running, M.D.

## 2022-05-22 DIAGNOSIS — C61 Malignant neoplasm of prostate: Secondary | ICD-10-CM | POA: Diagnosis not present

## 2022-05-22 DIAGNOSIS — R9721 Rising PSA following treatment for malignant neoplasm of prostate: Secondary | ICD-10-CM | POA: Diagnosis not present

## 2022-06-02 ENCOUNTER — Other Ambulatory Visit: Payer: Self-pay

## 2022-06-02 ENCOUNTER — Ambulatory Visit
Admission: RE | Admit: 2022-06-02 | Discharge: 2022-06-02 | Disposition: A | Discharge status: Home / Self Care | Payer: Medicare Other | Source: Ambulatory Visit | Attending: Radiation Oncology | Admitting: Radiation Oncology

## 2022-06-02 DIAGNOSIS — R9721 Rising PSA following treatment for malignant neoplasm of prostate: Secondary | ICD-10-CM | POA: Diagnosis not present

## 2022-06-02 DIAGNOSIS — C61 Malignant neoplasm of prostate: Secondary | ICD-10-CM | POA: Diagnosis not present

## 2022-06-02 LAB — RAD ONC ARIA SESSION SUMMARY
Course Elapsed Days: 0
Plan Fractions Treated to Date: 1
Plan Prescribed Dose Per Fraction: 7.25 Gy
Plan Total Fractions Prescribed: 5
Plan Total Prescribed Dose: 36.25 Gy
Reference Point Dosage Given to Date: 7.25 Gy
Reference Point Session Dosage Given: 7.25 Gy
Session Number: 1

## 2022-06-03 ENCOUNTER — Ambulatory Visit: Payer: Medicare Other | Admitting: Radiation Oncology

## 2022-06-04 ENCOUNTER — Ambulatory Visit
Admission: RE | Admit: 2022-06-04 | Discharge: 2022-06-04 | Disposition: A | Discharge status: Home / Self Care | Payer: Medicare Other | Source: Ambulatory Visit | Attending: Radiation Oncology | Admitting: Radiation Oncology

## 2022-06-04 ENCOUNTER — Other Ambulatory Visit: Payer: Self-pay

## 2022-06-04 DIAGNOSIS — C61 Malignant neoplasm of prostate: Secondary | ICD-10-CM | POA: Insufficient documentation

## 2022-06-04 DIAGNOSIS — R9721 Rising PSA following treatment for malignant neoplasm of prostate: Secondary | ICD-10-CM | POA: Diagnosis not present

## 2022-06-04 LAB — RAD ONC ARIA SESSION SUMMARY
Course Elapsed Days: 2
Plan Fractions Treated to Date: 2
Plan Prescribed Dose Per Fraction: 7.25 Gy
Plan Total Fractions Prescribed: 5
Plan Total Prescribed Dose: 36.25 Gy
Reference Point Dosage Given to Date: 14.5 Gy
Reference Point Session Dosage Given: 7.25 Gy
Session Number: 2

## 2022-06-05 ENCOUNTER — Ambulatory Visit: Payer: Medicare Other | Admitting: Radiation Oncology

## 2022-06-08 ENCOUNTER — Other Ambulatory Visit: Payer: Self-pay

## 2022-06-08 ENCOUNTER — Ambulatory Visit
Admission: RE | Admit: 2022-06-08 | Discharge: 2022-06-08 | Disposition: A | Discharge status: Home / Self Care | Payer: Medicare Other | Source: Ambulatory Visit | Attending: Radiation Oncology | Admitting: Radiation Oncology

## 2022-06-08 DIAGNOSIS — C61 Malignant neoplasm of prostate: Secondary | ICD-10-CM

## 2022-06-08 DIAGNOSIS — R9721 Rising PSA following treatment for malignant neoplasm of prostate: Secondary | ICD-10-CM | POA: Diagnosis not present

## 2022-06-08 LAB — RAD ONC ARIA SESSION SUMMARY
Course Elapsed Days: 6
Plan Fractions Treated to Date: 3
Plan Prescribed Dose Per Fraction: 7.25 Gy
Plan Total Fractions Prescribed: 5
Plan Total Prescribed Dose: 36.25 Gy
Reference Point Dosage Given to Date: 21.75 Gy
Reference Point Session Dosage Given: 7.25 Gy
Session Number: 3

## 2022-06-10 ENCOUNTER — Ambulatory Visit
Admission: RE | Admit: 2022-06-10 | Discharge: 2022-06-10 | Disposition: A | Discharge status: Home / Self Care | Payer: Medicare Other | Source: Ambulatory Visit | Attending: Radiation Oncology | Admitting: Radiation Oncology

## 2022-06-10 ENCOUNTER — Other Ambulatory Visit: Payer: Self-pay

## 2022-06-10 DIAGNOSIS — R9721 Rising PSA following treatment for malignant neoplasm of prostate: Secondary | ICD-10-CM | POA: Diagnosis not present

## 2022-06-10 DIAGNOSIS — C61 Malignant neoplasm of prostate: Secondary | ICD-10-CM | POA: Diagnosis not present

## 2022-06-10 LAB — RAD ONC ARIA SESSION SUMMARY
Course Elapsed Days: 8
Plan Fractions Treated to Date: 4
Plan Prescribed Dose Per Fraction: 7.25 Gy
Plan Total Fractions Prescribed: 5
Plan Total Prescribed Dose: 36.25 Gy
Reference Point Dosage Given to Date: 29 Gy
Reference Point Session Dosage Given: 7.25 Gy
Session Number: 4

## 2022-06-12 ENCOUNTER — Ambulatory Visit
Admission: RE | Admit: 2022-06-12 | Discharge: 2022-06-12 | Disposition: A | Discharge status: Home / Self Care | Payer: Medicare Other | Source: Ambulatory Visit | Attending: Radiation Oncology | Admitting: Radiation Oncology

## 2022-06-12 ENCOUNTER — Other Ambulatory Visit: Payer: Self-pay

## 2022-06-12 DIAGNOSIS — C61 Malignant neoplasm of prostate: Secondary | ICD-10-CM

## 2022-06-12 DIAGNOSIS — Z510 Encounter for antineoplastic radiation therapy: Secondary | ICD-10-CM | POA: Diagnosis not present

## 2022-06-12 DIAGNOSIS — R9721 Rising PSA following treatment for malignant neoplasm of prostate: Secondary | ICD-10-CM | POA: Diagnosis not present

## 2022-06-12 LAB — RAD ONC ARIA SESSION SUMMARY
Course Elapsed Days: 10
Plan Fractions Treated to Date: 5
Plan Prescribed Dose Per Fraction: 7.25 Gy
Plan Total Fractions Prescribed: 5
Plan Total Prescribed Dose: 36.25 Gy
Reference Point Dosage Given to Date: 36.25 Gy
Reference Point Session Dosage Given: 7.25 Gy
Session Number: 5

## 2022-06-12 NOTE — Progress Notes (Signed)
Patient was a RadOnc Consult on 04/30/2022 for his biochemical recurrence of prostate cancer with posttreatment PSA of 2.45 s/p brachytherapy for stage T2a adenocarcinoma of the prostate with Gleason Score of 3+4, and PSA of 11.9.  Patient proceed with treatment recommendations of SBRT and had his final radiation treatment on 5/10.   Patient is scheduled for a post treatment nurse call on 07/21/2022 and has a PSA check with [facility] on 7/12.

## 2022-06-17 NOTE — Radiation Completion Notes (Addendum)
  Radiation Oncology         [phone] ________________________________  Name: [name] MRN: [id number]  Date: 06/12/2022  DOB: [birth year]  End of Treatment Note  Diagnosis:   72 y.o. gentleman with a biochemical recurrence of prostate cancer with posttreatment PSA of 2.45 s/p brachytherapy for stage T2a adenocarcinoma of the prostate with Gleason Score of 3+4, and PSA of 11.9.      Patient Name: John [name], John [name] MRN: [id number] Date of Birth: [birth year] Referring Physician: Berniece Salines, M.D. Date of Service: 2022-06-17 Radiation Oncologist: Margaretmary Bayley, M.D. [facility]   RADIATION ONCOLOGY END OF TREATMENT NOTE   Diagnosis: 72 y.o. gentleman with a biochemical recurrence of prostate cancer with posttreatment PSA of 2.45 s/p brachytherapy for stage T2a adenocarcinoma of the prostate with Gleason Score of 3+4, and PSA of 11.9.   Intent: Curative   ==========DELIVERED PLANS==========  First Treatment Date: 2022-06-02 - Last Treatment Date: 2022-06-12   Plan Name: Prostate_SBRT Site: Prostate Technique: SBRT/SRT-IMRT Mode: Photon Dose Per Fraction: 7.25 Gy Prescribed Dose (Delivered / Prescribed): 36.25 Gy / 36.25 Gy Prescribed Fxs (Delivered / Prescribed): 5 / 5     ==========ON TREATMENT VISIT DATES========== 2022-06-02, 2022-06-04, 2022-06-04, 2022-06-08, 2022-06-10, 2022-06-12   See weekly On Treatment Notes is Epic for details. The patient tolerated radiation treatment relatively well without any acute ill side effects.  The patient will receive a call in about one month from the radiation oncology department. He will continue follow up with his urologist, Dr. Marlou Porch as well.  He is scheduled for posttreatment lab at the urology office on 08/14/2022 and will see Dr. Marlou Porch the following week.  ------------------------------------------------   Margaretmary Dys, MD [facility]  Radiation Oncology Direct Dial: [phone]   Fax: [phone] [email or web address]  Skype  LinkedIn

## 2022-07-03 NOTE — Addendum Note (Signed)
Encounter addended by: Marcello Fennel, PA-C on: 07/03/2022 10:42 AM  Actions taken: Clinical Note Signed

## 2022-07-21 ENCOUNTER — Ambulatory Visit
Admission: RE | Admit: 2022-07-21 | Discharge: 2022-07-21 | Disposition: A | Discharge status: Home / Self Care | Payer: Medicare Other | Source: Ambulatory Visit | Attending: Radiation Oncology | Admitting: Radiation Oncology

## 2022-07-21 NOTE — Progress Notes (Signed)
  Radiation Oncology         (336) [phone] ________________________________  Name: John [name] MRN: [id number]  Date of Service: 07/21/2022  DOB: 09-05-1950  Post Treatment Telephone Note  Diagnosis:  72 y.o. gentleman with a biochemical recurrence of prostate cancer with posttreatment PSA of 2.45 s/p brachytherapy for stage T2a adenocarcinoma of the prostate with Gleason Score of 3+4, and PSA of 11.9. (as documented in provider EOT note)  Pre Treatment IPSS Score: 4 (as documented in the provider consult note)  The patient was available for call today.   Symptoms of fatigue have improved since completing therapy.  Symptoms of bladder changes have improved since completing therapy. Current symptoms include none, and medications for bladder symptoms include none.  Symptoms of bowel changes have improved since completing therapy. Current symptoms include none, and medications for bowel symptoms include none.   Post Treatment IPSS Score: IPSS Questionnaire (AUA-7): Over the past month.   1)  How often have you had a sensation of not emptying your bladder completely after you finish urinating?  0 - Not at all  2)  How often have you had to urinate again less than two hours after you finished urinating? 0 - Not at all  3)  How often have you found you stopped and started again several times when you urinated?  0 - Not at all  4) How difficult have you found it to postpone urination?  0 - Not at all  5) How often have you had a weak urinary stream?  1 - Less than 1 time in 5  6) How often have you had to push or strain to begin urination?  0 - Not at all  7) How many times did you most typically get up to urinate from the time you went to bed until the time you got up in the morning?  2 - 2 times  Total score:  3. Which indicates mild symptoms  0-7 mildly symptomatic   8-19 moderately symptomatic   20-35 severely symptomatic    Patient has a scheduled follow up visit with his  urologist, Dr. Marlou Porch, on 08/14/2022 for ongoing surveillance. He was counseled that PSA levels will be drawn in the urology office, and was reassured that additional time is expected to improve bowel and bladder symptoms. He was encouraged to call back with concerns or questions regarding radiation.  This concludes the interview.   Ruel Favors, LPN

## 2022-08-14 DIAGNOSIS — R9721 Rising PSA following treatment for malignant neoplasm of prostate: Secondary | ICD-10-CM | POA: Diagnosis not present

## 2022-08-21 DIAGNOSIS — R9721 Rising PSA following treatment for malignant neoplasm of prostate: Secondary | ICD-10-CM | POA: Diagnosis not present

## 2022-08-21 DIAGNOSIS — N5201 Erectile dysfunction due to arterial insufficiency: Secondary | ICD-10-CM | POA: Diagnosis not present

## 2022-09-01 DIAGNOSIS — D2272 Melanocytic nevi of left lower limb, including hip: Secondary | ICD-10-CM | POA: Diagnosis not present

## 2022-09-01 DIAGNOSIS — Z1283 Encounter for screening for malignant neoplasm of skin: Secondary | ICD-10-CM | POA: Diagnosis not present

## 2022-09-01 DIAGNOSIS — D485 Neoplasm of uncertain behavior of skin: Secondary | ICD-10-CM | POA: Diagnosis not present

## 2022-09-01 DIAGNOSIS — D225 Melanocytic nevi of trunk: Secondary | ICD-10-CM | POA: Diagnosis not present

## 2022-09-22 DIAGNOSIS — L988 Other specified disorders of the skin and subcutaneous tissue: Secondary | ICD-10-CM | POA: Diagnosis not present

## 2022-09-22 DIAGNOSIS — D485 Neoplasm of uncertain behavior of skin: Secondary | ICD-10-CM | POA: Diagnosis not present

## 2022-09-29 DIAGNOSIS — C61 Malignant neoplasm of prostate: Secondary | ICD-10-CM

## 2022-09-29 NOTE — Progress Notes (Signed)
Patient called with request to now proceed with genetic testing.  Referral placed, patient aware.

## 2022-11-05 ENCOUNTER — Inpatient Hospital Stay: Payer: Medicare Other

## 2022-11-05 ENCOUNTER — Inpatient Hospital Stay: Payer: Medicare Other | Attending: Genetic Counselor | Admitting: Genetic Counselor

## 2022-11-05 ENCOUNTER — Other Ambulatory Visit: Payer: Self-pay | Admitting: Genetic Counselor

## 2022-11-05 DIAGNOSIS — C61 Malignant neoplasm of prostate: Secondary | ICD-10-CM

## 2022-11-05 DIAGNOSIS — Z8546 Personal history of malignant neoplasm of prostate: Secondary | ICD-10-CM | POA: Diagnosis not present

## 2022-11-05 DIAGNOSIS — Z1379 Encounter for other screening for genetic and chromosomal anomalies: Secondary | ICD-10-CM

## 2022-11-05 DIAGNOSIS — Z803 Family history of malignant neoplasm of breast: Secondary | ICD-10-CM

## 2022-11-05 DIAGNOSIS — Z800 Family history of malignant neoplasm of digestive organs: Secondary | ICD-10-CM | POA: Diagnosis not present

## 2022-11-05 LAB — GENETIC SCREENING ORDER

## 2022-11-06 ENCOUNTER — Encounter: Payer: Self-pay | Admitting: Genetic Counselor

## 2022-11-06 NOTE — Progress Notes (Signed)
REFERRING PROVIDER: Margaretmary Dys, MD  PRIMARY PROVIDER:  Philip Aspen, Limmie Patricia, MD  PRIMARY REASON FOR VISIT:  Encounter Diagnoses  Name Primary?   Malignant neoplasm of prostate (HCC) Yes   Family history of breast cancer    Family history of colon cancer    HISTORY OF PRESENT ILLNESS:   [name], a 72 y.o. male, was seen for a [place] cancer genetics consultation at the request of Dr. Kathrynn Running due to a personal and family history of cancer.  [name] presents to clinic today to discuss the possibility of a hereditary predisposition to cancer, to discuss genetic testing, and to further clarify his future cancer risks, as well as potential cancer risks for family members.   [name] is a 72 y.o. male with a local recurrence of prostate cancer. He was first diagnosed with prostate cancer (Gleason: 7) at age 76.  CANCER HISTORY:  Oncology History  Malignant neoplasm of prostate (HCC)  05/18/2019 Cancer Staging   Staging form: Prostate, AJCC 8th Edition - Clinical stage from 05/18/2019: Stage IIB (cT1c, cN0, cM0, PSA: 11.9, Grade Group: 2) - Signed by Marcello Fennel, PA-C on 06/20/2019   06/20/2019 Initial Diagnosis   Malignant neoplasm of prostate [facility])    Past Medical History:  Diagnosis Date   Cataract    Chronic constipation    ED (erectile dysfunction)    Family history of coronary artery disease    Hemorrhoids    History of colon polyps 2006   History of hepatitis A age 44   History of syncope    per pt hx syncopy episodes throughout life--- consultation w/ dr Anne Fu (cardiologist)--  negative stress test  (per lov note 04-19-2014  vasovagal/ situtional )   Hyperplasia of prostate with lower urinary tract symptoms (LUTS)    Prostate cancer [facility]) urologist-  dr Marlou Porch   dx 08-23-2015--  Gleason 3+4, PSA 4.75-- active surveillance;  04/ 2021 Stage T2a, Gleason 3+4, PSA 11.9, scheduled for radiactive prostate seed implants 08-30-2019   Wears glasses     Wears glasses     Past Surgical History:  Procedure Laterality Date   CARDIOVASCULAR STRESS TEST  05-11-2013   dr Anne Fu   Low risk nuclear study w/ small fixed defect in the inferolateral wall much more prominent on rest images c/w diaphragmatic attenuation, no evidence ishcemia/  normal LV function and wall motion, ef 62%   COLONOSCOPY  last one 08-25-2011   CYSTOSCOPY N/A 08/30/2019   Procedure: CYSTOSCOPY FLEXIBLE;  Surgeon: Crist Fat, MD;  Location: [facility];  Service: Urology;  Laterality: N/A;  no seeds found in bladder   PROSTATE BIOPSY N/A 01/15/2017   Procedure: BIOPSY TRANSRECTAL ULTRASONIC PROSTATE (TUBP);  Surgeon: Crist Fat, MD;  Location: [facility];  Service: Urology;  Laterality: N/A;   PROSTATE BIOPSY  2017   PROSTATE BIOPSY N/A 05/18/2019   Procedure: SATURATION BIOPSY TRANSRECTAL ULTRASONIC PROSTATE (TUBP);  Surgeon: Crist Fat, MD;  Location: [facility];  Service: Urology;  Laterality: N/A;   PROSTATE BIOPSY N/A 03/26/2022   Procedure: BIOPSY TRANSRECTAL ULTRASONIC PROSTATE (TUBP);  Surgeon: Crist Fat, MD;  Location: [facility];  Service: Urology;  Laterality: N/A;  30 MINS PLACE IN RM 5 AT 12:00 PM   RADIOACTIVE SEED IMPLANT N/A 08/30/2019   Procedure: RADIOACTIVE SEED IMPLANT/BRACHYTHERAPY IMPLANT;  Surgeon: Crist Fat, MD;  Location: [facility];  Service: Urology;  Laterality: N/A;  73 seeds implanted   SPACE OAR INSTILLATION N/A 08/30/2019   Procedure: SPACE OAR INSTILLATION;  Surgeon: Crist Fat, MD;  Location: [facility];  Service: Urology;  Laterality: N/A;   TONSILLECTOMY  age 59    Social History   Socioeconomic History   Marital status: Married    Spouse name: Not on file   Number of children: Not on file   Years of education: Not on file   Highest education level: Not on file  Occupational History   Not  on file  Tobacco Use   Smoking status: Never   Smokeless tobacco: Never  Vaping Use   Vaping status: Never Used  Substance and Sexual Activity   Alcohol use: Yes    Comment: 2 drink daily average---    Drug use: No   Sexual activity: Yes    Comment: with aid of sildenafil  Other Topics Concern   Not on file  Social History Narrative   No caffeine    Social Determinants of Health   Financial Resource Strain: Not on file  Food Insecurity: No Food Insecurity (04/30/2022)   Hunger Vital Sign    Worried About Running Out of Food in the Last Year: Never true    Ran Out of Food in the Last Year: Never true  Transportation Needs: No Transportation Needs (04/30/2022)   PRAPARE - Administrator, Civil Service (Medical): No    Lack of Transportation (Non-Medical): No  Physical Activity: Not on file  Stress: Not on file  Social Connections: Not on file     FAMILY HISTORY:  We obtained a detailed, 4-generation family history.  Significant diagnoses are listed below: Family History  Problem Relation Age of Onset   Breast cancer Mother 52   Heart disease Mother        CHF   Parkinsonism Mother    Heart attack Father    Colon polyps Father    Heart disease Brother        post CABG   Colon cancer Maternal Aunt 34   Lung cancer Maternal Uncle 76       heavy smoker   Cancer Cousin 37       paternal first cousin. type unknown.   Testicular cancer Nephew 54   Colon cancer Nephew 68   Esophageal cancer Neg Hx    Stomach cancer Neg Hx    Rectal cancer Neg Hx        [name] was diagnosed with testicular cancer at age 59 and colorectal cancer at age 70. [name] mother was diagnosed with breast cancer at age 72, she died at age 3. His maternal aunt was diagnosed with colon cancer at age 34, she died at age 41. His maternal uncle was diagnosed with lung cancer at age 21, he smoked and is deceased. His paternal first cousin was diagnosed with an unknown  type of cancer at age 50, he died at age 33. [name] is unaware of previous family history of genetic testing for hereditary cancer risks. There is no reported Ashkenazi Jewish ancestry.   GENETIC COUNSELING ASSESSMENT: Mr. Stankus is a 72 y.o. male with a personal and family history of cancer which is somewhat suggestive of a hereditary predisposition to cancer. We, therefore, discussed and recommended the following at today's visit.   DISCUSSION: We discussed that 5 - 10% of cancer is hereditary, with most cases of prostate cancer associated with BRCA1/2.  There are other  genes that can be associated with hereditary prostate cancer syndromes.  We discussed that testing is beneficial for several reasons including knowing how to follow individuals after completing their treatment, identifying whether potential treatment options would be beneficial, and understanding if other family members could be at risk for cancer and allowing them to undergo genetic testing.   We reviewed the characteristics, features and inheritance patterns of hereditary cancer syndromes. We also discussed genetic testing, including the appropriate family members to test, the process of testing, insurance coverage and turn-around-time for results. We discussed the implications of a negative, positive, carrier and/or variant of uncertain significant result. We recommended Mr. Rosato pursue genetic testing for a panel that includes genes associated with prostate, colon, and breast cancer.   Mr. Hoctor  was offered a common hereditary cancer panel (48 genes) and an expanded pan-cancer panel (70 genes). Mr. Kinzler was informed of the benefits and limitations of each panel, including that expanded pan-cancer panels contain genes that do not have clear management guidelines at this point in time.  We also discussed that as the number of genes included on a panel increases, the chances of variants of uncertain significance increases.  After considering the benefits and limitations of each gene panel, Mr. Gudino elected to have [facility] CancerNext-Expaned Panel.  The CancerNext-Expanded gene panel offered by [facility] and includes sequencing, rearrangement, and RNA analysis for the following 71 genes: AIP, ALK, APC, ATM, AXIN2, BAP1, BARD1, BMPR1A, BRCA1, BRCA2, BRIP1, CDC73, CDH1, CDK4, CDKN1B, CDKN2A, CHEK2, CTNNA1, DICER1, FH, FLCN, KIF1B, LZTR1, MAX, MEN1, MET, MLH1, MSH2, MSH3, MSH6, MUTYH, NF1, NF2, NTHL1, PALB2, PHOX2B, PMS2, POT1, PRKAR1A, PTCH1, PTEN, RAD51C, RAD51D, RB1, RET, SDHA, SDHAF2, SDHB, SDHC, SDHD, SMAD4, SMARCA4, SMARCB1, SMARCE1, STK11, SUFU, TMEM127, TP53, TSC1, TSC2, and VHL (sequencing and deletion/duplication); EGFR, EGLN1, HOXB13, KIT, MITF, PDGFRA, POLD1, and POLE (sequencing only); EPCAM and GREM1 (deletion/duplication only).   Based on Mr. Trost's personal and family history of cancer, he meets medical criteria for genetic testing. Despite that he meets criteria, he may still have an out of pocket cost. We discussed that if his out of pocket cost for testing is over $100, the laboratory should contact them to discuss self-pay prices, patient pay assistance programs, if applicable, and other billing options.  PLAN: After considering the risks, benefits, and limitations, Mr. Arel provided informed consent to pursue genetic testing and the blood sample was sent to [facility] for analysis of the CancerNext-Expanded Panel. Results should be available within approximately 2-3 weeks' time, at which point they will be disclosed by telephone to Mr. Eslinger, as will any additional recommendations warranted by these results. Mr. Staniszewski will receive a summary of his genetic counseling visit and a copy of his results once available. This information will also be available in Epic.   Mr. Agyeman questions were answered to his satisfaction today. Our contact information was provided should additional  questions or concerns arise. Thank you for the referral and allowing [place] to share in the care of your patient.   Lalla Brothers, MS, [facility] Genetic Counselor [email or web address] (P) [phone]  The patient was seen for a total of 30 minutes in face-to-face genetic counseling.  The patient was seen alone.  Drs. Pamelia Hoit and/or Mosetta Putt were available to discuss this case as needed.   _______________________________________________________________________ For Office Staff:  Number of people involved in session: 1 Was an Intern/ student involved with case: no

## 2022-11-11 DIAGNOSIS — H52223 Regular astigmatism, bilateral: Secondary | ICD-10-CM | POA: Diagnosis not present

## 2022-11-11 DIAGNOSIS — H524 Presbyopia: Secondary | ICD-10-CM | POA: Diagnosis not present

## 2022-11-11 DIAGNOSIS — H18593 Other hereditary corneal dystrophies, bilateral: Secondary | ICD-10-CM | POA: Diagnosis not present

## 2022-11-11 DIAGNOSIS — H26491 Other secondary cataract, right eye: Secondary | ICD-10-CM | POA: Diagnosis not present

## 2022-11-11 DIAGNOSIS — H04123 Dry eye syndrome of bilateral lacrimal glands: Secondary | ICD-10-CM | POA: Diagnosis not present

## 2022-11-11 DIAGNOSIS — H43813 Vitreous degeneration, bilateral: Secondary | ICD-10-CM | POA: Diagnosis not present

## 2022-11-19 ENCOUNTER — Telehealth: Payer: Self-pay | Admitting: Genetic Counselor

## 2022-11-19 ENCOUNTER — Encounter: Payer: Self-pay | Admitting: Family Medicine

## 2022-11-19 ENCOUNTER — Ambulatory Visit: Payer: Medicare Other | Admitting: Family Medicine

## 2022-11-19 ENCOUNTER — Encounter: Payer: Self-pay | Admitting: Genetic Counselor

## 2022-11-19 VITALS — Ht 69.0 in | Wt 156.0 lb

## 2022-11-19 DIAGNOSIS — Z0000 Encounter for general adult medical examination without abnormal findings: Secondary | ICD-10-CM

## 2022-11-19 DIAGNOSIS — Z1379 Encounter for other screening for genetic and chromosomal anomalies: Secondary | ICD-10-CM | POA: Insufficient documentation

## 2022-11-19 NOTE — Patient Instructions (Signed)
I really enjoyed getting to talk with you today! I am available on Tuesdays and Thursdays for virtual visits if you have any questions or concerns, or if I can be of any further assistance.   CHECKLIST FROM ANNUAL WELLNESS VISIT:  -Follow up (please call to schedule if not scheduled after visit):   -in office visit in the next week or so for the symptoms and annual checkin   -yearly for annual wellness visit with primary care office  Here is a list of your preventive care/health maintenance measures and the plan for each if any are due:  PLAN For any measures below that may be due:  -can get the vaccines at the pharmacy.   Health Maintenance  Topic Date Due   Pneumonia Vaccine 90+ Years old (3 of 3 - PPSV23 or PCV20) 12/21/2018   INFLUENZA VACCINE  09/03/2022   COVID-19 Vaccine (4 - 2023-24 season) 10/04/2022   Medicare Annual Wellness (AWV)  11/19/2023   DTaP/Tdap/Td (3 - Td or Tdap) 05/05/2027   Colonoscopy  11/14/2031   Hepatitis C Screening  Completed   Zoster Vaccines- Shingrix  Completed   HPV VACCINES  Aged Out    -See a dentist at least yearly  -Get your eyes checked and then per your eye specialist's recommendations  -Other issues addressed today:   -I have included below further information regarding a healthy whole foods based diet, physical activity guidelines for adults, stress management and opportunities for social connections. I hope you find this information useful.   -----------------------------------------------------------------------------------------------------------------------------------------------------------------------------------------------------------------------------------------------------------  NUTRITION: -eat real food: lots of colorful vegetables (half the plate) and fruits -5-7 servings of vegetables and fruits per day (fresh or steamed is best), exp. 2 servings of vegetables with lunch and dinner and 2 servings of fruit per day.  Berries and greens such as kale and collards are great choices.  -consume on a regular basis: whole grains (make sure first ingredient on label contains the word "whole"), fresh fruits, fish, nuts, seeds, healthy oils (such as olive oil, avocado oil, grape seed oil) -may eat small amounts of dairy and lean meat on occasion, but avoid processed meats such as ham, bacon, lunch meat, etc. -drink water -try to avoid fast food and pre-packaged foods, processed meat -most experts advise limiting sodium to < 2300mg  per day, should limit further is any chronic conditions such as high blood pressure, heart disease, diabetes, etc. The American Heart Association advised that < 1500mg  is is ideal -try to avoid foods that contain any ingredients with names you do not recognize  -try to avoid sugar/sweets (except for the natural sugar that occurs in fresh fruit) -try to avoid sweet drinks -try to avoid white rice, white bread, pasta (unless whole grain), white or yellow potatoes  EXERCISE GUIDELINES FOR ADULTS: -if you wish to increase your physical activity, do so gradually and with the approval of your doctor -STOP and seek medical care immediately if you have any chest pain, chest discomfort or trouble breathing when starting or increasing exercise  -move and stretch your body, legs, feet and arms when sitting for long periods -Physical activity guidelines for optimal health in adults: -least 150 minutes per week of aerobic exercise (can talk, but not sing) once approved by your doctor, 20-30 minutes of sustained activity or two 10 minute episodes of sustained activity every day.  -resistance training at least 2 days per week if approved by your doctor -balance exercises 3+ days per week:   Stand somewhere where you  have something sturdy to hold onto if you lose balance.    1) lift up on toes, start with 5x per day and work up to 20x   2) stand and lift on leg straight out to the side so that foot is a few  inches of the floor, start with 5x each side and work up to 20x each side   3) stand on one foot, start with 5 seconds each side and work up to 20 seconds on each side  If you need ideas or help with getting more active:  -Silver sneakers https://tools.silversneakers.com  -Walk with a Doc: http://www.duncan-williams.com/  -try to include resistance (weight lifting/strength building) and balance exercises twice per week: or the following link for ideas: http://castillo-powell.com/  BuyDucts.dk  STRESS MANAGEMENT: -can try meditating, or just sitting quietly with deep breathing while intentionally relaxing all parts of your body for 5 minutes daily -if you need further help with stress, anxiety or depression please follow up with your primary doctor or contact the wonderful folks at [facility]: [phone]  SOCIAL CONNECTIONS: -options in [place] if you wish to engage in more social and exercise related activities:  -Silver sneakers https://tools.silversneakers.com  -Walk with a Doc: http://www.duncan-williams.com/  -Check out the [facility] Active Adults 50+ section on the [place] of [employer] (hiking clubs, book clubs, cards and games, chess, exercise classes, aquatic classes and much more) - see the website for details: https://www.[place].gov/departments/parks-recreation/active-adults50  -YouTube has lots of exercise videos for different ages and abilities as well  -[facility] (a variety of indoor and outdoor inperson activities for adults). 3808402898. [address]  -Virtual Online Classes (a variety of topics): see seniorplanet.org or call 3014002322  -consider volunteering at a school, hospice center, church, senior center or elsewhere

## 2022-11-19 NOTE — Progress Notes (Signed)
PATIENT CHECK-IN and HEALTH RISK ASSESSMENT QUESTIONNAIRE:  -completed by phone/video for upcoming Medicare Preventive Visit  Pre-Visit Check-in: 1)Vitals (height, wt, BP, etc) - record in vitals section for visit on day of visit Request home vitals (wt, BP, etc.) and enter into vitals, THEN update Vital Signs SmartPhrase below at the top of the HPI. See below.  2)Review and Update Medications, Allergies PMH, Surgeries, Social history in Epic 3)Hospitalizations in the last year with date/reason? no  4)Review and Update Care Team (patient's specialists) in Epic 5) Complete PHQ9 in Epic  6) Complete Fall Screening in Epic 7)Review all Health Maintenance Due and order under PCP if not done.  Medicare Wellness Patient Questionnaire:  Answer theses question about your habits: Do you drink alcohol? Yes, Beer If yes, how many drinks do you have a day? Drink 1-2 drinks 4x times a week Have you ever smoked?no Quit date if applicable? N/a  How many packs a day do/did you smoke? N/a Do you use smokeless tobacco?no Do you use an illicit drugs?no Do you exercises? Does workout at home - weights/stretches but not regular Are you sexually active? Yes, pt states moderately Number of partners? 1 Typical breakfast: a piece of fruit, nut, some kind of vegetable, protein-fish, can fish Typical lunch: no  Typical dinner: some kind of vegetables, protein-fish, pasta Typical snacks:sometimes ice cream. Not a big snacks  Beverages: water, beer  Answer theses question about you: Can you perform most household chores?yes Do you find it hard to follow a conversation in a noisy room?no  Do you often ask people to speak up or repeat themselves?no Do you feel that you have a problem with memory?no Do you balance your checkbook and or bank acounts?yes  Do you feel safe at home?yes Last dentist visit?yesterday with [facility] in [place] Do you need assistance with any of the following: No  Please  note if so   Driving?  Feeding yourself?  Getting from bed to chair?  Getting to the toilet?  Bathing or showering?  Dressing yourself?  Managing money?  Climbing a flight of stairs  Preparing meals?    Do you have Advanced Directives in place (Living Will, Healthcare Power or Attorney)? No - he and his wife are very aware of this   Last eye Exam and location?2 wks ago with [facility].    Do you currently use prescribed or non-prescribed narcotic or opioid pain medications?No  Do you have a history or close family history of breast, ovarian, tubal or peritoneal cancer or a family member with BRCA (breast cancer susceptibility 1 and 2) gene mutations? Mother had breast cancer  Request home vitals (wt, BP, etc.) and enter into vitals, THEN update Vital Signs SmartPhrase below at the top of the HPI. See below.   Nurse/Assistant Credentials/time stamp: Karpuih Moyun/CMA/4:33pm   ----------------------------------------------------------------------------------------------------------------------------------------------------------------------------------------------------------------------  Because this visit was a virtual/telehealth visit, some criteria may be missing or patient reported. Any vitals not documented were not able to be obtained and vitals that have been documented are patient reported.    MEDICARE ANNUAL PREVENTIVE CARE VISIT WITH PROVIDER (Welcome to Medicare, initial annual wellness or annual wellness exam)  Virtual Visit via Phone Note  I connected with John [name] on 11/19/22  by phone and verified that I am speaking with the correct person using two identifiers.  Location patient: home Location provider:work or home office Persons participating in the virtual visit: patient, provider  Concerns and/or follow up today: sees  urology for hx prostate ca, sees oncology for skin ca. Gets allergies every year in the fall - nasal congestion, pnd, sore  throat...but has not cleared up and has some fatigue with it. Allegra helps when he takes. Reports happens every year. Had a little urinary symptoms also and will see his urologist for that next week.    See HM section in Epic for other details of completed HM.    ROS: negative for report of fevers, unintentional weight loss, vision changes, vision loss, hearing loss or change, chest pain, sob, hemoptysis, melena, hematochezia, hematuria, falls, bleeding or bruising, thoughts of suicide or self harm, memory loss  Patient-completed extensive health risk assessment - reviewed and discussed with the patient: See Health Risk Assessment completed with patient prior to the visit either above or in recent phone note. This was reviewed in detailed with the patient today and appropriate recommendations, orders and referrals were placed as needed per Summary below and patient instructions.   Review of Medical History: -PMH, PSH, Family History and current specialty and care providers reviewed and updated and listed below   Patient Care Team: Philip Aspen, Limmie Patricia, MD as PCP - General (Internal Medicine) Cherlyn Cushing, RN as Oncology Nurse Navigator Crist Fat, MD as Attending Physician (Urology) Bonnick, Gwenyth Ober, DMD (Dentistry) Mat Carne, DO (Ophthalmology) Nita Sells, MD (Dermatology)   Past Medical History:  Diagnosis Date   Cataract    Chronic constipation    ED (erectile dysfunction)    Family history of coronary artery disease    Hemorrhoids    History of colon polyps 2006   History of hepatitis A age 34   History of syncope    per pt hx syncopy episodes throughout life--- consultation w/ dr Anne Fu (cardiologist)--  negative stress test  (per lov note 04-19-2014  vasovagal/ situtional )   Hyperplasia of prostate with lower urinary tract symptoms (LUTS)    Prostate cancer [facility]) urologist-  dr Marlou Porch   dx 08-23-2015--  Gleason 3+4, PSA 4.75-- active surveillance;   04/ 2021 Stage T2a, Gleason 3+4, PSA 11.9, scheduled for radiactive prostate seed implants 08-30-2019   Wears glasses    Wears glasses     Past Surgical History:  Procedure Laterality Date   CARDIOVASCULAR STRESS TEST  05-11-2013   dr Anne Fu   Low risk nuclear study w/ small fixed defect in the inferolateral wall much more prominent on rest images c/w diaphragmatic attenuation, no evidence ishcemia/  normal LV function and wall motion, ef 62%   COLONOSCOPY  last one 08-25-2011   CYSTOSCOPY N/A 08/30/2019   Procedure: CYSTOSCOPY FLEXIBLE;  Surgeon: Crist Fat, MD;  Location: [facility];  Service: Urology;  Laterality: N/A;  no seeds found in bladder   PROSTATE BIOPSY N/A 01/15/2017   Procedure: BIOPSY TRANSRECTAL ULTRASONIC PROSTATE (TUBP);  Surgeon: Crist Fat, MD;  Location: [facility];  Service: Urology;  Laterality: N/A;   PROSTATE BIOPSY  2017   PROSTATE BIOPSY N/A 05/18/2019   Procedure: SATURATION BIOPSY TRANSRECTAL ULTRASONIC PROSTATE (TUBP);  Surgeon: Crist Fat, MD;  Location: [facility];  Service: Urology;  Laterality: N/A;   PROSTATE BIOPSY N/A 03/26/2022   Procedure: BIOPSY TRANSRECTAL ULTRASONIC PROSTATE (TUBP);  Surgeon: Crist Fat, MD;  Location[facility];  Service: Urology;  Laterality: N/A;  30 MINS PLACE IN RM 5 AT 12:00 PM   RADIOACTIVE SEED IMPLANT N/A 08/30/2019   Procedure: RADIOACTIVE SEED IMPLANT/BRACHYTHERAPY  IMPLANT;  Surgeon: Crist Fat, MD;  Location: [facility];  Service: Urology;  Laterality: N/A;   73 seeds implanted   SPACE OAR INSTILLATION N/A 08/30/2019   Procedure: SPACE OAR INSTILLATION;  Surgeon: Crist Fat, MD;  [facility];  Service: Urology;  Laterality: N/A;   TONSILLECTOMY  age 35    Social History   Socioeconomic History   Marital status: Married    Spouse name: Not on file   Number of  children: Not on file   Years of education: Not on file   Highest education level: Not on file  Occupational History   Not on file  Tobacco Use   Smoking status: Never   Smokeless tobacco: Never  Vaping Use   Vaping status: Never Used  Substance and Sexual Activity   Alcohol use: Yes    Comment: 2 drink daily average---    Drug use: No   Sexual activity: Yes    Comment: with aid of sildenafil  Other Topics Concern   Not on file  Social History Narrative   No caffeine    Social Determinants of Health   Financial Resource Strain: Not on file  Food Insecurity: No Food Insecurity (04/30/2022)   Hunger Vital Sign    Worried About Running Out of Food in the Last Year: Never true    Ran Out of Food in the Last Year: Never true  Transportation Needs: No Transportation Needs (04/30/2022)   PRAPARE - Administrator, Civil Service (Medical): No    Lack of Transportation (Non-Medical): No  Physical Activity: Not on file  Stress: Not on file  Social Connections: Not on file  Intimate Partner Violence: Not At Risk (04/30/2022)   Humiliation, Afraid, Rape, and Kick questionnaire    Fear of Current or Ex-Partner: No    Emotionally Abused: No    Physically Abused: No    Sexually Abused: No    Family History  Problem Relation Age of Onset   Breast cancer Mother 42   Heart disease Mother        CHF   Parkinsonism Mother    Heart attack Father    Colon polyps Father    Heart disease Brother        post CABG   Colon cancer Maternal Aunt 34   Lung cancer Maternal Uncle 50       heavy smoker   Cancer Cousin 5       paternal first cousin. type unknown.   Testicular cancer Nephew 54   Colon cancer Nephew 68   Esophageal cancer Neg Hx    Stomach cancer Neg Hx    Rectal cancer Neg Hx     Current Outpatient Medications on File Prior to Visit  Medication Sig Dispense Refill   Cyanocobalamin (B-12) 100 MCG TABS Take by mouth.     sildenafil (REVATIO) 20 MG tablet Take  2-5 tablets by mouth as needed  5   docusate sodium (COLACE) 250 MG capsule Take 250 mg by mouth 2 (two) times daily. (Patient not taking: Reported on 11/19/2022)     Current Facility-Administered Medications on File Prior to Visit  Medication Dose Route Frequency Provider Last Rate Last Admin   sodium phosphate (FLEET) 7-19 GM/118ML enema 1 enema  1 enema Rectal Once Crist Fat, MD        No Known Allergies     Physical Exam Vitals requested from patient and listed below if  patient had equipment and was able to obtain at home for this virtual visit: There were no vitals filed for this visit. Estimated body mass index is 23.04 kg/m as calculated from the following:   Height as of this encounter: 5\' 9"  (1.753 m).   Weight as of this encounter: 156 lb (70.8 kg).  EKG (optional): deferred due to virtual visit  GENERAL: alert, oriented, no acute distress detected; full vision exam deferred due to pandemic and/or virtual encounter  PSYCH/NEURO: pleasant and cooperative, no obvious depression or anxiety, speech and thought processing grossly intact, Cognitive function grossly intact  Flowsheet Row Office Visit from 11/19/2022 in [facility] [place]  PHQ-9 Total Score 3           11/19/2022    4:17 PM 09/03/2021   11:02 AM 02/09/2018    2:22 PM 09/09/2017    1:55 PM 09/08/2016    1:41 PM  Depression screen PHQ 2/9  Decreased Interest 0 0 0 0 0  Down, Depressed, Hopeless 0 0 0 0 0  PHQ - 2 Score 0 0 0 0 0  Altered sleeping 0 0     Tired, decreased energy 3 1     Change in appetite 0 0     Feeling bad or failure about yourself  0 0     Trouble concentrating 0 0     Moving slowly or fidgety/restless 0 0     Suicidal thoughts 0 0     PHQ-9 Score 3 1     Difficult doing work/chores Somewhat difficult Somewhat difficult          06/20/2019    1:42 PM 10/11/2019   10:17 AM 09/03/2021   11:05 AM 11/16/2022   10:47 AM 11/19/2022    4:17 PM  Fall Risk   Falls in the past year?   0 0 0  Was there an injury with Fall?     0  Fall Risk Category Calculator     0  (RETIRED) Patient Fall Risk Level Low fall risk Low fall risk     Patient at Risk for Falls Due to     No Fall Risks  Fall risk Follow up     Falls evaluation completed     SUMMARY AND PLAN:  Encounter for Medicare annual wellness exam  Discussed applicable health maintenance/preventive health measures and advised and referred or ordered per patient preferences: -discussed vaccines due and he plans to get at pharmacy Health Maintenance  Topic Date Due   Pneumonia Vaccine 79+ Years old (3 of 3 - PPSV23 or PCV20) 12/21/2018   INFLUENZA VACCINE  09/03/2022   COVID-19 Vaccine (4 - 2023-24 season) 10/04/2022   Medicare Annual Wellness (AWV)  11/19/2023   DTaP/Tdap/Td (3 - Td or Tdap) 05/05/2027   Colonoscopy  11/14/2031   Hepatitis C Screening  Completed   Zoster Vaccines- Shingrix  Completed   HPV VACCINES  Aged Out  -he does psa with urology and sees them next week  -discussed various potential causes of his symptoms. He denies thick or discolored mucus, fevers, etc. Query allergies or viral vs other. Advised in office visit to examine given ongoing sore throat. Sent message to schedulers and asked patient to call office to schedule if not contacted by tomorrow afternoon.  Education and counseling on the following was provided based on the above review of health and a plan/checklist for the patient, along with additional information discussed, was provided for the patient  in the patient instructions :  -Advised on importance of completing advanced directives, discussed options for completing and provided information in patient instructions as well -Provided  safe balance exercises that can be done at home to improve balance and discussed exercise guidelines for adults with include balance exercises at least 3 days per week see patient instructions.  -Advised and counseled on a  healthy lifestyle - including the importance of a healthy diet, regular physical activity, social connections and stress management. -Reviewed patient's current diet. Congratulated on good diet choices.  A summary of a healthy diet was provided in the Patient Instructions.  -reviewed patient's current physical activity level and discussed exercise guidelines for adults. Discussed community resources and ideas for safe exercise at home to assist in meeting exercise guideline recommendations in a safe and healthy way.  -Advise yearly dental visits at minimum and regular eye exams -Advised and counseled on alcohol safe limits, risks  Follow up: see patient instructions   Patient Instructions  I really enjoyed getting to talk with you today! I am available on Tuesdays and Thursdays for virtual visits if you have any questions or concerns, or if I can be of any further assistance.   CHECKLIST FROM ANNUAL WELLNESS VISIT:  -Follow up (please call to schedule if not scheduled after visit):   -in office visit in the next week or so for the symptoms and annual checkin   -yearly for annual wellness visit with primary care office  Here is a list of your preventive care/health maintenance measures and the plan for each if any are due:  PLAN For any measures below that may be due:  -can get the vaccines at the pharmacy.   Health Maintenance  Topic Date Due   Pneumonia Vaccine 90+ Years old (3 of 3 - PPSV23 or PCV20) 12/21/2018   INFLUENZA VACCINE  09/03/2022   COVID-19 Vaccine (4 - 2023-24 season) 10/04/2022   Medicare Annual Wellness (AWV)  11/19/2023   DTaP/Tdap/Td (3 - Td or Tdap) 05/05/2027   Colonoscopy  11/14/2031   Hepatitis C Screening  Completed   Zoster Vaccines- Shingrix  Completed   HPV VACCINES  Aged Out    -See a dentist at least yearly  -Get your eyes checked and then per your eye specialist's recommendations  -Other issues addressed today:   -I have included below  further information regarding a healthy whole foods based diet, physical activity guidelines for adults, stress management and opportunities for social connections. I hope you find this information useful.   -----------------------------------------------------------------------------------------------------------------------------------------------------------------------------------------------------------------------------------------------------------  NUTRITION: -eat real food: lots of colorful vegetables (half the plate) and fruits -5-7 servings of vegetables and fruits per day (fresh or steamed is best), exp. 2 servings of vegetables with lunch and dinner and 2 servings of fruit per day. Berries and greens such as kale and collards are great choices.  -consume on a regular basis: whole grains (make sure first ingredient on label contains the word "whole"), fresh fruits, fish, nuts, seeds, healthy oils (such as olive oil, avocado oil, grape seed oil) -may eat small amounts of dairy and lean meat on occasion, but avoid processed meats such as ham, bacon, lunch meat, etc. -drink water -try to avoid fast food and pre-packaged foods, processed meat -most experts advise limiting sodium to < 2300mg  per day, should limit further is any chronic conditions such as high blood pressure, heart disease, diabetes, etc. The American Heart Association advised that < 1500mg  is is ideal -try to avoid foods that  contain any ingredients with names you do not recognize  -try to avoid sugar/sweets (except for the natural sugar that occurs in fresh fruit) -try to avoid sweet drinks -try to avoid white rice, white bread, pasta (unless whole grain), white or yellow potatoes  EXERCISE GUIDELINES FOR ADULTS: -if you wish to increase your physical activity, do so gradually and with the approval of your doctor -STOP and seek medical care immediately if you have any chest pain, chest discomfort or trouble breathing when  starting or increasing exercise  -move and stretch your body, legs, feet and arms when sitting for long periods -Physical activity guidelines for optimal health in adults: -least 150 minutes per week of aerobic exercise (can talk, but not sing) once approved by your doctor, 20-30 minutes of sustained activity or two 10 minute episodes of sustained activity every day.  -resistance training at least 2 days per week if approved by your doctor -balance exercises 3+ days per week:   Stand somewhere where you have something sturdy to hold onto if you lose balance.    1) lift up on toes, start with 5x per day and work up to 20x   2) stand and lift on leg straight out to the side so that foot is a few inches of the floor, start with 5x each side and work up to 20x each side   3) stand on one foot, start with 5 seconds each side and work up to 20 seconds on each side  If you need ideas or help with getting more active:  -Silver sneakers https://tools.silversneakers.com  -Walk with a Doc: http://www.duncan-williams.com/  -try to include resistance (weight lifting/strength building) and balance exercises twice per week: or the following link for ideas: http://castillo-powell.com/  BuyDucts.dk  STRESS MANAGEMENT: -can try meditating, or just sitting quietly with deep breathing while intentionally relaxing all parts of your body for 5 minutes daily -if you need further help with stress, anxiety or depression please follow up with your primary doctor or contact the wonderful folks at [facility]: [phone]  SOCIAL CONNECTIONS: -options in [place] if you wish to engage in more social and exercise related activities:  -Silver sneakers https://tools.silversneakers.com  -Walk with a Doc: http://www.duncan-williams.com/  -Check out the [employer] Active Adults 50+ section on the [place] [employer] (hiking clubs, book clubs, cards and games, chess, exercise classes, aquatic classes and much more) - see the website for details: https://www.[place].gov/departments/parks-recreation/active-adults50  -YouTube has lots of exercise videos for different ages and abilities as well  -[facility] (a variety of indoor and outdoor inperson activities for adults). 629-662-6236. [address]  -Virtual Online Classes (a variety of topics): see seniorplanet.org or call [phone]  -consider volunteering at a school, hospice center, church, senior center or elsewhere           Terressa Koyanagi, DO

## 2022-11-19 NOTE — Telephone Encounter (Signed)
I contacted Mr. Breiner to discuss his genetic testing results. No pathogenic variants were identified in the 71 genes analyzed. Detailed clinic note to follow.  The test report has been scanned into EPIC and is located under the Molecular Pathology section of the Results Review tab.  A portion of the result report is included below for reference.   Lalla Brothers, MS, [facility] Genetic Counselor [email or web address] (P) [phone]

## 2022-11-20 DIAGNOSIS — R9721 Rising PSA following treatment for malignant neoplasm of prostate: Secondary | ICD-10-CM | POA: Diagnosis not present

## 2022-11-20 LAB — PSA: PSA: 0.9

## 2022-11-24 NOTE — Progress Notes (Unsigned)
No chief complaint on file.   HPI: John [name] 72 y.o. come in for SADA  PCP appt NA?  Did Wellness visit  10 17 and sx not brought up at that time   Seen by urology for hois prostate cancer  s/p brachy radiation therapy and is to have 3 mos fu ? October18  For psa and fu .[name] Kitchen Dr Marlou Porch  ROS: See pertinent positives and negatives per HPI.  Past Medical History:  Diagnosis Date   Cataract    Chronic constipation    ED (erectile dysfunction)    Family history of coronary artery disease    Hemorrhoids    History of colon polyps 2006   History of hepatitis A age 45   History of syncope    per pt hx syncopy episodes throughout life--- consultation w/ dr Anne Fu (cardiologist)--  negative stress test  (per lov note 04-19-2014  vasovagal/ situtional )   Hyperplasia of prostate with lower urinary tract symptoms (LUTS)    Prostate cancer [facility]) urologist-  dr Marlou Porch   dx 08-23-2015--  Gleason 3+4, PSA 4.75-- active surveillance;  04/ 2021 Stage T2a, Gleason 3+4, PSA 11.9, scheduled for radiactive prostate seed implants 08-30-2019   Wears glasses    Wears glasses     Family History  Problem Relation Age of Onset   Breast cancer Mother 50   Heart disease Mother        CHF   Parkinsonism Mother    Heart attack Father    Colon polyps Father    Heart disease Brother        post CABG   Colon cancer Maternal Aunt 34   Lung cancer Maternal Uncle 103       heavy smoker   Cancer Cousin 83       paternal first cousin. type unknown.   Testicular cancer Nephew 54   Colon cancer Nephew 68   Esophageal cancer Neg Hx    Stomach cancer Neg Hx    Rectal cancer Neg Hx     Social History   Socioeconomic History   Marital status: Married    Spouse name: Not on file   Number of children: Not on file   Years of education: Not on file   Highest education level: Not on file  Occupational History   Not on file  Tobacco Use   Smoking status: Never   Smokeless tobacco: Never  Vaping  Use   Vaping status: Never Used  Substance and Sexual Activity   Alcohol use: Yes    Comment: 2 drink daily average---    Drug use: No   Sexual activity: Yes    Comment: with aid of sildenafil  Other Topics Concern   Not on file  Social History Narrative   No caffeine    Social Determinants of Health   Financial Resource Strain: Not on file  Food Insecurity: No Food Insecurity (04/30/2022)   Hunger Vital Sign    Worried About Running Out of Food in the Last Year: Never true    Ran Out of Food in the Last Year: Never true  Transportation Needs: No Transportation Needs (04/30/2022)   PRAPARE - Administrator, Civil Service (Medical): No    Lack of Transportation (Non-Medical): No  Physical Activity: Not on file  Stress: Not on file  Social Connections: Not on file    Outpatient Medications Prior to Visit  Medication Sig Dispense Refill   Cyanocobalamin (B-12) 100 MCG  TABS Take by mouth.     docusate sodium (COLACE) 250 MG capsule Take 250 mg by mouth 2 (two) times daily. (Patient not taking: Reported on 11/19/2022)     sildenafil (REVATIO) 20 MG tablet Take 2-5 tablets by mouth as needed  5   Facility-Administered Medications Prior to Visit  Medication Dose Route Frequency Provider Last Rate Last Admin   sodium phosphate (FLEET) 7-19 GM/118ML enema 1 enema  1 enema Rectal Once Crist Fat, MD         EXAM:  There were no vitals taken for this visit.  There is no height or weight on file to calculate BMI.  GENERAL: vitals reviewed and listed above, alert, oriented, appears well hydrated and in no acute distress HEENT: atraumatic, conjunctiva  clear, no obvious abnormalities on inspection of external nose and ears OP : no lesion edema or exudate  NECK: no obvious masses on inspection palpation  LUNGS: clear to auscultation bilaterally, no wheezes, rales or rhonchi, good air movement CV: HRRR, no clubbing cyanosis or  peripheral edema nl cap refill  MS:  moves all extremities without noticeable focal  abnormality PSYCH: pleasant and cooperative, no obvious depression or anxiety Lab Results  Component Value Date   WBC 4.0 09/03/2021   HGB 14.9 09/03/2021   HCT 44.3 09/03/2021   PLT 153.0 09/03/2021   GLUCOSE 89 09/03/2021   CHOL 181 09/03/2021   TRIG 89.0 09/03/2021   HDL 50.20 09/03/2021   LDLDIRECT 140.8 08/23/2009   LDLCALC 113 (H) 09/03/2021   ALT 12 09/03/2021   AST 16 09/03/2021   NA 140 09/03/2021   K 4.6 09/03/2021   CL 103 09/03/2021   CREATININE 0.85 09/03/2021   BUN 12 09/03/2021   CO2 29 09/03/2021   TSH 1.41 09/03/2021   PSA 2.45 01/05/2022   INR 1.0 08/28/2019   BP Readings from Last 3 Encounters:  04/30/22 116/79  03/26/22 117/74  11/13/21 119/70    ASSESSMENT AND PLAN:  Discussed the following assessment and plan:  Malignant neoplasm of prostate [facility])  -Patient advised to return or notify health care team  if  new concerns arise.  There are no Patient Instructions on file for this visit.   Neta Mends. Lux Skilton M.D.

## 2022-11-25 ENCOUNTER — Encounter: Payer: Self-pay | Admitting: Internal Medicine

## 2022-11-25 ENCOUNTER — Ambulatory Visit: Payer: Medicare Other | Admitting: Internal Medicine

## 2022-11-25 VITALS — BP 110/70 | HR 65 | Temp 97.6°F | Ht 69.0 in | Wt 161.8 lb

## 2022-11-25 DIAGNOSIS — R300 Dysuria: Secondary | ICD-10-CM

## 2022-11-25 DIAGNOSIS — R3989 Other symptoms and signs involving the genitourinary system: Secondary | ICD-10-CM

## 2022-11-25 DIAGNOSIS — Z23 Encounter for immunization: Secondary | ICD-10-CM | POA: Diagnosis not present

## 2022-11-25 DIAGNOSIS — C61 Malignant neoplasm of prostate: Secondary | ICD-10-CM

## 2022-11-25 LAB — POCT URINALYSIS DIPSTICK
Blood, UA: POSITIVE
Glucose, UA: NEGATIVE
Ketones, UA: NEGATIVE
Nitrite, UA: POSITIVE — AB
Protein, UA: NEGATIVE
Spec Grav, UA: 1.025 (ref 1.010–1.025)
Urobilinogen, UA: 2 U/dL — AB
pH, UA: 5 (ref 5.0–8.0)

## 2022-11-25 MED ORDER — CIPROFLOXACIN HCL 500 MG PO TABS: 500.0000 mg | ORAL_TABLET | Freq: Two times a day (BID) | ORAL | 0 refills | Status: AC

## 2022-11-25 NOTE — Patient Instructions (Addendum)
Treating for infection and will send information to urology.   Culture pending   Flu vaccine today .  Fu  if persistent  sx gu  with urology   or hemorrhoid  sounds like a  thrombosed hemorrhoid  that usually resolves with time. Sitz bath and or otc topicals can be tried .  In interim  Lab Results  Component Value Date   COLORU Red Orange 11/25/2022   CLARITYU Clear 11/25/2022   GLUCOSEUR Negative 11/25/2022   BILIRUBINUR 2+ 11/25/2022   KETONESU negative 11/25/2022   SPECGRAV 1.025 11/25/2022   RBCUR Positive 11/25/2022   PHUR 5.0 11/25/2022   PROTEINUR Negative 11/25/2022   UROBILINOGEN 2.0 (A) 11/25/2022   LEUKOCYTESUR Moderate (2+) (A) 11/25/2022

## 2022-11-26 LAB — URINE CULTURE
MICRO NUMBER:: 15632728
SPECIMEN QUALITY:: ADEQUATE

## 2022-11-27 ENCOUNTER — Encounter: Payer: Self-pay | Admitting: Genetic Counselor

## 2022-11-27 ENCOUNTER — Ambulatory Visit: Payer: Self-pay | Admitting: Genetic Counselor

## 2022-11-27 DIAGNOSIS — Z1379 Encounter for other screening for genetic and chromosomal anomalies: Secondary | ICD-10-CM

## 2022-11-27 NOTE — Progress Notes (Signed)
HPI:   [name] was previously seen in the [facility] due to a personal and family history of cancer and concerns regarding a hereditary predisposition to cancer. Please refer to our prior cancer genetics clinic note for more information regarding our discussion, assessment and recommendations, at the time. [name] recent genetic test results were disclosed to him, as were recommendations warranted by these results. These results and recommendations are discussed in more detail below.  CANCER HISTORY:  Oncology History  Malignant neoplasm of prostate (HCC)  05/18/2019 Cancer Staging   Staging form: Prostate, AJCC 8th Edition - Clinical stage from 05/18/2019: Stage IIB (cT1c, cN0, cM0, PSA: 11.9, Grade Group: 2) - Signed by Marcello Fennel, PA-C on 06/20/2019   06/20/2019 Initial Diagnosis   Malignant neoplasm of prostate (HCC)     FAMILY HISTORY:  We obtained a detailed, 4-generation family history.  Significant diagnoses are listed below:      Family History  Problem Relation Age of Onset   Breast cancer Mother 67   Heart disease Mother          CHF   Parkinsonism Mother     Heart attack Father     Colon polyps Father     Heart disease Brother          post CABG   Colon cancer Maternal Aunt 34   Lung cancer Maternal Uncle 30        heavy smoker   Cancer Cousin 5        paternal first cousin. type unknown.   Testicular cancer Nephew 54   Colon cancer Nephew 68   Esophageal cancer Neg Hx     Stomach cancer Neg Hx     Rectal cancer Neg Hx                 [name] nephew was diagnosed with testicular cancer at age 29 and colorectal cancer at age 30. [name] mother was diagnosed with breast cancer at age 61, she died at age 34. His maternal aunt was diagnosed with colon cancer at age 69, she died at age 30. His maternal uncle was diagnosed with lung cancer at age 32, he smoked and is deceased. His paternal first cousin was diagnosed  with an unknown type of cancer at age 54, he died at age 61. [name] is unaware of previous family history of genetic testing for hereditary cancer risks. There is no reported Ashkenazi Jewish ancestry.   GENETIC TEST RESULTS:  The [facility] CancerNext-Expanded Panel found no pathogenic mutations.   The CancerNext-Expanded gene panel offered by [facility] and includes sequencing, rearrangement, and RNA analysis for the following 71 genes: AIP, ALK, APC, ATM, AXIN2, BAP1, BARD1, BMPR1A, BRCA1, BRCA2, BRIP1, CDC73, CDH1, CDK4, CDKN1B, CDKN2A, CHEK2, CTNNA1, DICER1, FH, FLCN, KIF1B, LZTR1, MAX, MEN1, MET, MLH1, MSH2, MSH3, MSH6, MUTYH, NF1, NF2, NTHL1, PALB2, PHOX2B, PMS2, POT1, PRKAR1A, PTCH1, PTEN, RAD51C, RAD51D, RB1, RET, SDHA, SDHAF2, SDHB, SDHC, SDHD, SMAD4, SMARCA4, SMARCB1, SMARCE1, STK11, SUFU, TMEM127, TP53, TSC1, TSC2, and VHL (sequencing and deletion/duplication); EGFR, EGLN1, HOXB13, KIT, MITF, PDGFRA, POLD1, and POLE (sequencing only); EPCAM and GREM1 (deletion/duplication only).   The test report has been scanned into EPIC and is located under the Molecular Pathology section of the Results Review tab.  A portion of the result report is included below for reference. Genetic testing reported out on 11/18/2022.       Even though a pathogenic variant was not identified, possible  explanations for the cancer in the family may include: There may be no hereditary risk for cancer in the family. The cancers in [name] and/or his family may be due to other genetic or environmental factors. There may be a gene mutation in one of these genes that current testing methods cannot detect, but that chance is small. There could be another gene that has not yet been discovered, or that we have not yet tested, that is responsible for the cancer diagnoses in the family.  It is also possible there is a hereditary cause for the cancer in the family that [name] did not inherit.  Therefore, it is  important to remain in touch with cancer genetics in the future so that we can continue to offer [name] the most up to date genetic testing.   ADDITIONAL GENETIC TESTING:  We discussed with [name] that his genetic testing was fairly extensive.  If there are genes identified to increase cancer risk that can be analyzed in the future, we would be happy to discuss and coordinate this testing at that time.    CANCER SCREENING RECOMMENDATIONS:  [name] test result is considered negative (normal).  This means that we have not identified a hereditary cause for his personal and family history of cancer at this time.   An individual's cancer risk and medical management are not determined by genetic test results alone. Overall cancer risk assessment incorporates additional factors, including personal medical history, family history, and any available genetic information that may result in a personalized plan for cancer prevention and surveillance. Therefore, it is recommended he continue to follow the cancer management and screening guidelines provided by his oncology and primary healthcare provider.  RECOMMENDATIONS FOR FAMILY MEMBERS:   Since he did not inherit a mutation in a cancer predisposition gene included on this panel, his children could not have inherited a mutation from him in one of these genes. Other members of the family may still carry a pathogenic variant in one of these genes that [name] did not inherit. Based on the family history, we recommend his brother and nephew who has a history of cancer consider genetic counseling and testing.   FOLLOW-UP:  Cancer genetics is a rapidly advancing field and it is possible that new genetic tests will be appropriate for him and/or his family members in the future. We encouraged him to remain in contact with cancer genetics on an annual basis so we can update his personal and family histories and let him know of advances in cancer  genetics that may benefit this family.   Our contact number was provided. [name] questions were answered to his satisfaction, and he knows he is welcome to call us at anytime with additional questions or concerns.   Lalla Brothers, MS, [facility] Genetic Counselor [email or web address] (P) [phone]

## 2022-11-29 NOTE — Progress Notes (Signed)
Urine shows only a few bacteria  cannot verify UTI  please fu with urology about  other further advice

## 2022-12-14 ENCOUNTER — Emergency Department (HOSPITAL_COMMUNITY)
Admission: EM | Admit: 2022-12-14 | Discharge: 2022-12-14 | Disposition: A | Discharge status: Home / Self Care | Payer: Medicare Other | Attending: Emergency Medicine | Admitting: Emergency Medicine

## 2022-12-14 ENCOUNTER — Emergency Department (HOSPITAL_COMMUNITY): Payer: Medicare Other

## 2022-12-14 ENCOUNTER — Other Ambulatory Visit: Payer: Self-pay

## 2022-12-14 DIAGNOSIS — K5900 Constipation, unspecified: Secondary | ICD-10-CM | POA: Insufficient documentation

## 2022-12-14 DIAGNOSIS — K648 Other hemorrhoids: Secondary | ICD-10-CM | POA: Insufficient documentation

## 2022-12-14 DIAGNOSIS — Z8546 Personal history of malignant neoplasm of prostate: Secondary | ICD-10-CM | POA: Insufficient documentation

## 2022-12-14 DIAGNOSIS — K644 Residual hemorrhoidal skin tags: Secondary | ICD-10-CM | POA: Insufficient documentation

## 2022-12-14 DIAGNOSIS — K6289 Other specified diseases of anus and rectum: Secondary | ICD-10-CM | POA: Diagnosis present

## 2022-12-14 DIAGNOSIS — R109 Unspecified abdominal pain: Secondary | ICD-10-CM | POA: Diagnosis not present

## 2022-12-14 LAB — BASIC METABOLIC PANEL
Anion gap: 10 (ref 5–15)
BUN: 13 mg/dL (ref 8–23)
CO2: 25 mmol/L (ref 22–32)
Calcium: 9.4 mg/dL (ref 8.9–10.3)
Chloride: 103 mmol/L (ref 98–111)
Creatinine, Ser: 0.77 mg/dL (ref 0.61–1.24)
GFR, Estimated: >60 mL/min (ref >60)
Glucose, Bld: 109 mg/dL — ABNORMAL HIGH (ref 70–99)
Potassium: 4.1 mmol/L (ref 3.5–5.1)
Sodium: 138 mmol/L (ref 135–145)

## 2022-12-14 LAB — CBC
HCT: 39.5 % (ref 39.0–52.0)
Hemoglobin: 13.2 g/dL (ref 13.0–17.0)
MCH: 30.6 pg (ref 26.0–34.0)
MCHC: 33.4 g/dL (ref 30.0–36.0)
MCV: 91.6 fL (ref 80.0–100.0)
Platelets: 165 10*3/uL (ref 150–400)
RBC: 4.31 MIL/uL (ref 4.22–5.81)
RDW: 12.7 % (ref 11.5–15.5)
WBC: 8.9 10*3/uL (ref 4.0–10.5)
nRBC: 0 % (ref 0.0–0.2)

## 2022-12-14 NOTE — ED Provider Notes (Signed)
[facility] [name] EMERGENCY DEPARTMENT AT [facility] Provider Note   CSN: [id number] Arrival date & time: 12/14/22  0150     History  Chief Complaint  Patient presents with   Rectal Bleeding   Rectal Pain    John [name] is a 72 y.o. male who presents to the ED with concern for bright red rectal bleeding and rectal pain in the context of constipation with attempted fecal self-disimpaction at home.  Patient with known internal and external hemorrhoids.  States he has been sent quite constipated for the last several days, this evening felt severe pressure and discomfort in his rectum, attempted self disimpaction unsuccessfully, some bright red blood at that time.  Subsequently took a dose of MiraLAX and presented to the ED due to his discomfort.  While in the waiting room he was able to have a bowel movement with passage of large amount of very firm stool per patient.  No rectal bleeding at that time, and reports complete resolution of his discomfort.  Have reviewed his medical records.  Has history of prostate cancer status post brachytherapy and radiation, following with urology.  No abdominal pain.  HPI     Home Medications Prior to Admission medications   Medication Sig Start Date End Date Taking? Authorizing Provider  Cyanocobalamin (B-12) 100 MCG TABS Take by mouth.    [provider]  sildenafil (REVATIO) 20 MG tablet Take 2-5 tablets by mouth as needed 04/29/15   [provider]      Allergies    Patient has no known allergies.    Review of Systems   Review of Systems  Respiratory: Negative.    Cardiovascular: Negative.   Gastrointestinal:  Positive for anal bleeding and constipation.  Genitourinary: Negative.   Musculoskeletal: Negative.     Physical Exam Updated Vital Signs BP 132/79   Pulse 69   Temp 98.2 F (36.8 C) (Oral)   Resp 16   Ht 5\' 9"  (1.753 m)   Wt 70.3 kg   SpO2 100%   BMI 22.89 kg/m  Physical Exam Vitals and  nursing note reviewed. Exam conducted with a chaperone present (ED RN Marcello Moores).  Constitutional:      Appearance: He is not ill-appearing or toxic-appearing.  HENT:     Head: Normocephalic and atraumatic.     Mouth/Throat:     Mouth: Mucous membranes are moist.     Pharynx: No oropharyngeal exudate or posterior oropharyngeal erythema.  Eyes:     General:        Right eye: No discharge.        Left eye: No discharge.     Conjunctiva/sclera: Conjunctivae normal.  Cardiovascular:     Rate and Rhythm: Normal rate and regular rhythm.     Pulses: Normal pulses.     Heart sounds: Normal heart sounds. No murmur heard. Pulmonary:     Effort: Pulmonary effort is normal. No respiratory distress.     Breath sounds: Normal breath sounds. No wheezing or rales.  Abdominal:     General: Bowel sounds are normal. There is no distension.     Palpations: Abdomen is soft.     Tenderness: There is no abdominal tenderness. There is no right CVA tenderness, left CVA tenderness, guarding or rebound.  Genitourinary:    Rectum: External hemorrhoid and internal hemorrhoid present. No anal fissure.       Comments: No stool palpable in the rectal vault on time of DRE Musculoskeletal:  General: No deformity.     Cervical back: Neck supple.  Skin:    General: Skin is warm and dry.     Capillary Refill: Capillary refill takes less than 2 seconds.  Neurological:     General: No focal deficit present.     Mental Status: He is alert and oriented to person, place, and time. Mental status is at baseline.  Psychiatric:        Mood and Affect: Mood normal.     ED Results / Procedures / Treatments   Labs (all labs ordered are listed, but only abnormal results are displayed) Labs Reviewed  BASIC METABOLIC PANEL - Abnormal; Notable for the following components:      Result Value   Glucose, Bld 109 (*)    All other components within normal limits  CBC    EKG None  Radiology DG Abdomen 1  View  Result Date: 12/14/2022 CLINICAL DATA:  Constipation with stomach pain. EXAM: ABDOMEN - 1 VIEW COMPARISON:  None Available. FINDINGS: The bowel gas pattern is normal. A very large amount of stool is seen throughout the colon. Prostate radiation implantation seeds are noted. No radio-opaque calculi or other significant radiographic abnormality are seen. IMPRESSION: Very large stool burden without evidence of bowel obstruction. Electronically Signed   By: Aram Candela M.D.   On: 12/14/2022 02:52    Procedures Procedures    Medications Ordered in ED Medications - No data to display  ED Course/ Medical Decision Making/ A&P                                 Medical Decision Making 72 year old male with constipation and scant anal bleeding following attempted disimpaction at home.  Normal vitals on intake.  Cardiopulmonary abdominal exams are benign.  Rectal exam as above, reassuring.  Amount and/or Complexity of Data Reviewed Labs: ordered.    Details: CBC and BMP unremarkable Radiology: ordered.    Details: Large volume stool burden without evidence of obstruction on abdominal x-ray.   Patient evaluated in the ED following large bowel movement while waiting in the ED waiting room.  X-ray was obtained prior to bowel movement and patient states that his symptoms have completely resolved.  Do not see any indication for repeat x-ray given benign abdominal exam and reassuring DRE.  Will instruct patient to continue MiraLAX at home for the next days, titrate dose to soft but formed stool.  Recommend he follows up with his PCP.  Clinical concern for emergent underlying etiology of this patient's symptoms that would warrant further ED workup or inpatient management is exceedingly low.  [name] voiced understanding of her medical evaluation and treatment plan. Each of their questions answered to their expressed satisfaction.  Return precautions were given.  Patient is well-appearing, stable,  and was discharged in good condition.  This chart was dictated using voice recognition software, Dragon. Despite the best efforts of this provider to proofread and correct errors, errors may still occur which can change documentation meaning.         Final Clinical Impression(s) / ED Diagnoses Final diagnoses:  Constipation, unspecified constipation type    Rx / DC Orders ED Discharge Orders     None         Sherrilee Gilles 12/14/22 [id number]    Marily Memos, MD 12/14/22 2257

## 2022-12-14 NOTE — Discharge Instructions (Signed)
You were seen in the ER today for your constipation. You successfully had a bowel movement in the ER today. You may continue your over the counter treatment for your hemorrhoids. Please follow up with your urologist and gastroenterologist and return to the ER with any new severe symptom. Please continue the Miralax for the next few days - titrate the dose to soft but formed stool.

## 2022-12-14 NOTE — ED Triage Notes (Signed)
Pt arrives to ED c/o mild bright red rectal bleeding and pain since 10pm yesterday. Pt reports that he had to disimpact feces from rectum when trying to have bowel movement and that is when pain and bleeding started.PT reports that pain has not subsided and he feels that he is still constipated

## 2022-12-15 ENCOUNTER — Telehealth: Payer: Self-pay

## 2022-12-15 NOTE — Transitions of Care (Post Inpatient/ED Visit) (Signed)
   12/15/2022  Name: John [name] MRN: [id number] DOB: Aug 16, 1950  Today's TOC FU Call Status: Today's TOC FU Call Status:: Successful TOC FU Call Completed TOC FU Call Complete Date: 12/15/22 Patient's Name and Date of Birth confirmed.  Transition Care Management Follow-up Telephone Call Date of Discharge: 12/14/22 Discharge Facility: [facility]) Type of Discharge: Emergency Department Reason for ED Visit: Other: (constipation) How have you been since you were released from the hospital?: Same Any questions or concerns?: No  Items Reviewed: Did you receive and understand the discharge instructions provided?: Yes Medications obtained,verified, and reconciled?: Yes (Medications Reviewed) Any new allergies since your discharge?: No Dietary orders reviewed?: Yes Do you have support at home?: Yes People in Home: spouse  Medications Reviewed Today: Medications Reviewed Today     Reviewed by Karena Addison, LPN (Licensed Practical Nurse) on 12/15/22 at 1103  Med List Status: <None>   Medication Order Taking? Sig Documenting Provider Last Dose Status Informant  Cyanocobalamin (B-12) 100 MCG TABS [id number] No Take by mouth. [provider] Taking Active   sildenafil (REVATIO) 20 MG tablet [id number] No Take 2-5 tablets by mouth as needed [provider] Taking Active Self           Med Note Lennon Alstrom, John [name]   Mon May 13, 2015 10:31 AM) Received from: External Pharmacy            Home Care and Equipment/Supplies: Were Home Health Services Ordered?: NA Any new equipment or medical supplies ordered?: NA  Functional Questionnaire: Do you need assistance with bathing/showering or dressing?: No Do you need assistance with meal preparation?: No Do you need assistance with eating?: No Do you have difficulty maintaining continence: No Do you need assistance with getting out of bed/getting out of a chair/moving?: No Do you have difficulty managing or  taking your medications?: No  Follow up appointments reviewed: PCP Follow-up appointment confirmed?: Yes Date of PCP follow-up appointment?: 12/21/22 Follow-up Provider: Philip Aspen Specialist [facility] Follow-up appointment confirmed?: NA Do you need transportation to your follow-up appointment?: No Do you understand care options if your condition(s) worsen?: Yes-patient verbalized understanding    SIGNATURE Karena Addison, LPN [facility] Nurse Health Advisor Direct Dial [phone]

## 2022-12-16 DIAGNOSIS — Z23 Encounter for immunization: Secondary | ICD-10-CM | POA: Diagnosis not present

## 2022-12-17 DIAGNOSIS — R3912 Poor urinary stream: Secondary | ICD-10-CM | POA: Diagnosis not present

## 2022-12-17 DIAGNOSIS — R350 Frequency of micturition: Secondary | ICD-10-CM | POA: Diagnosis not present

## 2022-12-17 DIAGNOSIS — R300 Dysuria: Secondary | ICD-10-CM | POA: Diagnosis not present

## 2022-12-18 DIAGNOSIS — R300 Dysuria: Secondary | ICD-10-CM | POA: Diagnosis not present

## 2022-12-21 ENCOUNTER — Encounter: Payer: Self-pay | Admitting: Internal Medicine

## 2022-12-21 ENCOUNTER — Ambulatory Visit (INDEPENDENT_AMBULATORY_CARE_PROVIDER_SITE_OTHER): Payer: Medicare Other | Admitting: Internal Medicine

## 2022-12-21 VITALS — BP 110/70 | HR 64 | Temp 97.8°F | Wt 160.2 lb

## 2022-12-21 DIAGNOSIS — K5900 Constipation, unspecified: Secondary | ICD-10-CM | POA: Diagnosis not present

## 2022-12-21 DIAGNOSIS — Z09 Encounter for follow-up examination after completed treatment for conditions other than malignant neoplasm: Secondary | ICD-10-CM | POA: Diagnosis not present

## 2022-12-21 NOTE — Progress Notes (Signed)
Established Patient Office Visit     CC/Reason for Visit: ED follow-up, constipation  HPI: John [name] is a 72 y.o. male who is coming in today for the above mentioned reasons.  He was seen in the ED on November 11 for bright red blood per rectum in the setting of severe constipation after which she had done a manual disimpaction.  While in the ED he had a large volume stool with significant improvement.  His x-ray also showed a large stool burden.   Past Medical/Surgical History: Past Medical History:  Diagnosis Date   Cataract    Chronic constipation    ED (erectile dysfunction)    Family history of coronary artery disease    Hemorrhoids    History of colon polyps 2006   History of hepatitis A age 72   History of syncope    per pt hx syncopy episodes throughout life--- consultation w/ dr Anne Fu (cardiologist)--  negative stress test  (per lov note 04-19-2014  vasovagal/ situtional )   Hyperplasia of prostate with lower urinary tract symptoms (LUTS)    Prostate cancer [facility]) urologist-  dr Marlou Porch   dx 08-23-2015--  Gleason 3+4, PSA 4.75-- active surveillance;  04/ 2021 Stage T2a, Gleason 3+4, PSA 11.9, scheduled for radiactive prostate seed implants 08-30-2019   Wears glasses    Wears glasses     Past Surgical History:  Procedure Laterality Date   CARDIOVASCULAR STRESS TEST  05-11-2013   dr Anne Fu   Low risk nuclear study w/ small fixed defect in the inferolateral wall much more prominent on rest images c/w diaphragmatic attenuation, no evidence ishcemia/  normal LV function and wall motion, ef 62%   COLONOSCOPY  last one 08-25-2011   CYSTOSCOPY N/A 08/30/2019   Procedure: CYSTOSCOPY FLEXIBLE;  Surgeon: Crist Fat, MD;  Location: [facility];  Service: Urology;  Laterality: N/A;  no seeds found in bladder   PROSTATE BIOPSY N/A 01/15/2017   Procedure: BIOPSY TRANSRECTAL ULTRASONIC PROSTATE (TUBP);  Surgeon: Crist Fat, MD;  Location:  [facility];  Service: Urology;  Laterality: N/A;   PROSTATE BIOPSY  2017   PROSTATE BIOPSY N/A 05/18/2019   Procedure: SATURATION BIOPSY TRANSRECTAL ULTRASONIC PROSTATE (TUBP);  Surgeon: Crist Fat, MD;  Location: [facility];  Service: Urology;  Laterality: N/A;   PROSTATE BIOPSY N/A 03/26/2022   Procedure: BIOPSY TRANSRECTAL ULTRASONIC PROSTATE (TUBP);  Surgeon: Crist Fat, MD;  Location: [facility];  Service: Urology;  Laterality: N/A;  30 MINS PLACE IN RM 5 AT 12:00 PM   RADIOACTIVE SEED IMPLANT N/A 08/30/2019   Procedure: RADIOACTIVE SEED IMPLANT/BRACHYTHERAPY IMPLANT;  Surgeon: Crist Fat, MD;  Location: [facility];  Service: Urology;  Laterality: N/A;   73 seeds implanted   SPACE OAR INSTILLATION N/A 08/30/2019   Procedure: SPACE OAR INSTILLATION;  Surgeon: Crist Fat, MD;  Location: [facility];  Service: Urology;  Laterality: N/A;   TONSILLECTOMY  age 56    Social History:  reports that he has never smoked. He has never used smokeless tobacco. He reports current alcohol use. He reports that he does not use drugs.  Allergies: No Known Allergies  Family History:  Family History  Problem Relation Age of Onset   Breast cancer Mother 63   Heart disease Mother        CHF   Parkinsonism Mother    Heart attack Father  Colon polyps Father    Heart disease Brother        post CABG   Colon cancer Maternal Aunt 34   Lung cancer Maternal Uncle 53       heavy smoker   Cancer Cousin 43       paternal first cousin. type unknown.   Testicular cancer Nephew 54   Colon cancer Nephew 68   Esophageal cancer Neg Hx    Stomach cancer Neg Hx    Rectal cancer Neg Hx      Current Outpatient Medications:    Cyanocobalamin (B-12) 100 MCG TABS, Take by mouth., Disp: , Rfl:    sildenafil (REVATIO) 20 MG tablet, Take 2-5 tablets by mouth as needed, Disp: , Rfl: 5 No current  facility-administered medications for this visit.  Facility-Administered Medications Ordered in Other Visits:    sodium phosphate (FLEET) 7-19 GM/118ML enema 1 enema, 1 enema, Rectal, Once, Crist Fat, MD  Review of Systems:  Negative unless indicated in HPI.   Physical Exam: Vitals:   12/21/22 1526  BP: 110/70  Pulse: 64  Temp: 97.8 F (36.6 C)  TempSrc: Oral  SpO2: 98%  Weight: 160 lb 3.2 oz (72.7 kg)    Body mass index is 23.66 kg/m.   Physical Exam Vitals reviewed.  Constitutional:      Appearance: Normal appearance.  HENT:     Head: Normocephalic and atraumatic.  Eyes:     Conjunctiva/sclera: Conjunctivae normal.     Pupils: Pupils are equal, round, and reactive to light.  Skin:    General: Skin is warm and dry.  Neurological:     General: No focal deficit present.     Mental Status: He is alert and oriented to person, place, and time.  Psychiatric:        Mood and Affect: Mood normal.        Behavior: Behavior normal.        Thought Content: Thought content normal.        Judgment: Judgment normal.      Impression and Plan:  Hospital discharge follow-up  Constipation, unspecified constipation type   [facility] charts reviewed in detail. -We discussed importance of increasing fluid consumption and we discussed how to titrate MiraLAX with a goal of a soft bowel movement at least every other day.  Time spent:30 minutes reviewing chart, interviewing and examining patient and formulating plan of care.     [name], MD [facility] at [facility]

## 2022-12-23 ENCOUNTER — Encounter: Payer: Self-pay | Admitting: Internal Medicine

## 2023-02-16 ENCOUNTER — Encounter: Payer: Self-pay | Admitting: Internal Medicine

## 2023-02-16 ENCOUNTER — Ambulatory Visit (INDEPENDENT_AMBULATORY_CARE_PROVIDER_SITE_OTHER): Payer: Medicare Other | Admitting: Internal Medicine

## 2023-02-16 VITALS — BP 110/70 | HR 60 | Temp 97.8°F | Ht 69.75 in | Wt 165.1 lb

## 2023-02-16 DIAGNOSIS — Z0000 Encounter for general adult medical examination without abnormal findings: Secondary | ICD-10-CM

## 2023-02-16 DIAGNOSIS — C61 Malignant neoplasm of prostate: Secondary | ICD-10-CM

## 2023-02-16 LAB — CBC WITH DIFFERENTIAL/PLATELET
Basophils Absolute: 0 10*3/uL (ref 0.0–0.1)
Basophils Relative: 0.5 % (ref 0.0–3.0)
Eosinophils Absolute: 0 10*3/uL (ref 0.0–0.7)
Eosinophils Relative: 0.7 % (ref 0.0–5.0)
HCT: 43 % (ref 39.0–52.0)
Hemoglobin: 14.3 g/dL (ref 13.0–17.0)
Lymphocytes Relative: 25.2 % (ref 12.0–46.0)
Lymphs Abs: 1.1 10*3/uL (ref 0.7–4.0)
MCHC: 33.2 g/dL (ref 30.0–36.0)
MCV: 92.7 fL (ref 78.0–100.0)
Monocytes Absolute: 0.4 10*3/uL (ref 0.1–1.0)
Monocytes Relative: 9.7 % (ref 3.0–12.0)
Neutro Abs: 2.9 10*3/uL (ref 1.4–7.7)
Neutrophils Relative %: 63.9 % (ref 43.0–77.0)
Platelets: 191 10*3/uL (ref 150.0–400.0)
RBC: 4.64 Mil/uL (ref 4.22–5.81)
RDW: 13.8 % (ref 11.5–15.5)
WBC: 4.5 10*3/uL (ref 4.0–10.5)

## 2023-02-16 LAB — COMPREHENSIVE METABOLIC PANEL
ALT: 16 U/L (ref 0–53)
AST: 16 U/L (ref 0–37)
Albumin: 4.7 g/dL (ref 3.5–5.2)
Alkaline Phosphatase: 90 U/L (ref 39–117)
BUN: 18 mg/dL (ref 6–23)
CO2: 28 meq/L (ref 19–32)
Calcium: 9.8 mg/dL (ref 8.4–10.5)
Chloride: 106 meq/L (ref 96–112)
Creatinine, Ser: 0.88 mg/dL (ref 0.40–1.50)
GFR: 84.45 mL/min (ref >60.00)
Glucose, Bld: 96 mg/dL (ref 70–99)
Potassium: 4.4 meq/L (ref 3.5–5.1)
Sodium: 141 meq/L (ref 135–145)
Total Bilirubin: 0.7 mg/dL (ref 0.2–1.2)
Total Protein: 7 g/dL (ref 6.0–8.3)

## 2023-02-16 LAB — LIPID PANEL
Cholesterol: 180 mg/dL (ref 0–200)
HDL: 56.1 mg/dL (ref >39.00)
LDL Cholesterol: 112 mg/dL — ABNORMAL HIGH (ref 0–99)
NonHDL: 124.11
Total CHOL/HDL Ratio: 3
Triglycerides: 59 mg/dL (ref 0.0–149.0)
VLDL: 11.8 mg/dL (ref 0.0–40.0)

## 2023-02-16 NOTE — Progress Notes (Signed)
 Established Patient Office Visit     CC/Reason for Visit: Subsequent Medicare visit  HPI: John [name] is a 73 y.o. male who is coming in today for the above mentioned reasons. Past Medical History is significant for: Prostate cancer.  Feeling well, no major concerns or complaints.  Has routine eye and dental care.  Had his colonoscopy in 2023.  He follows routinely with urology in regards to his history of prostate cancer.  He is overdue for RSV vaccination.   Past Medical/Surgical History: Past Medical History:  Diagnosis Date   Cataract    Chronic constipation    ED (erectile dysfunction)    Family history of coronary artery disease    Hemorrhoids    History of colon polyps 2006   History of hepatitis A age 89   History of syncope    per pt hx syncopy episodes throughout life--- consultation w/ dr jeffrie (cardiologist)--  negative stress test  (per lov note 04-19-2014  vasovagal/ situtional )   Hyperplasia of prostate with lower urinary tract symptoms (LUTS)    Prostate cancer [facility]) urologist-  dr cam   dx 08-23-2015--  Gleason 3+4, PSA 4.75-- active surveillance;  04/ 2021 Stage T2a, Gleason 3+4, PSA 11.9, scheduled for radiactive prostate seed implants 08-30-2019   Wears glasses    Wears glasses     Past Surgical History:  Procedure Laterality Date   CARDIOVASCULAR STRESS TEST  05-11-2013   dr jeffrie   Low risk nuclear study w/ small fixed defect in the inferolateral wall much more prominent on rest images c/w diaphragmatic attenuation, no evidence ishcemia/  normal LV function and wall motion, ef 62%   COLONOSCOPY  last one 08-25-2011   CYSTOSCOPY N/A 08/30/2019   Procedure: CYSTOSCOPY FLEXIBLE;  Surgeon: Cam Morene ORN, MD;  Location: [facility];  Service: Urology;  Laterality: N/A;  no seeds found in bladder   EYE SURGERY  2023   PROSTATE BIOPSY N/A 01/15/2017   Procedure: BIOPSY TRANSRECTAL ULTRASONIC PROSTATE (TUBP);  Surgeon:  Cam Morene ORN, MD;  Location: [facility];  Service: Urology;  Laterality: N/A;   PROSTATE BIOPSY  2017   PROSTATE BIOPSY N/A 05/18/2019   Procedure: SATURATION BIOPSY TRANSRECTAL ULTRASONIC PROSTATE (TUBP);  Surgeon: Cam Morene ORN, MD;  Location: [facility];  Service: Urology;  Laterality: N/A;   PROSTATE BIOPSY N/A 03/26/2022   Procedure: BIOPSY TRANSRECTAL ULTRASONIC PROSTATE (TUBP);  Surgeon: Cam Morene ORN, MD;  Location: [facility];  Service: Urology;  Laterality: N/A;  30 MINS PLACE IN RM 5 AT 12:00 PM   RADIOACTIVE SEED IMPLANT N/A 08/30/2019   Procedure: RADIOACTIVE SEED IMPLANT/BRACHYTHERAPY IMPLANT;  Surgeon: Cam Morene ORN, MD;  Location: [facility];  Service: Urology;  Laterality: N/A;   73 seeds implanted   SPACE OAR INSTILLATION N/A 08/30/2019   Procedure: SPACE OAR INSTILLATION;  Surgeon: Cam Morene ORN, MD;  Location: [facility];  Service: Urology;  Laterality: N/A;   TONSILLECTOMY  age 56    Social History:  reports that he has never smoked. He has never used smokeless tobacco. He reports current alcohol use of about 5.0 standard drinks of alcohol per week. He reports that he does not use drugs.  Allergies: No Known Allergies  Family History:  Family History  Problem Relation Age of Onset   Breast cancer Mother 32   Heart disease Mother        CHF  Parkinsonism Mother    Cancer Mother    Heart attack Father    Colon polyps Father    Heart disease Father    Heart disease Brother        post CABG   Colon cancer Maternal Aunt 34   Lung cancer Maternal Uncle 59       heavy smoker   Cancer Cousin 40       paternal first cousin. type unknown.   Testicular cancer Nephew 54   Colon cancer Nephew 68   Esophageal cancer Neg Hx    Stomach cancer Neg Hx    Rectal cancer Neg Hx      Current Outpatient Medications:    Cyanocobalamin  (B-12) 100 MCG TABS, Take by  mouth., Disp: , Rfl:    polyethylene glycol powder (MIRALAX) 17 GM/SCOOP powder, , Disp: , Rfl:    sildenafil (REVATIO) 20 MG tablet, Take 2-5 tablets by mouth as needed, Disp: , Rfl: 5   tamsulosin  (FLOMAX ) 0.4 MG CAPS capsule, , Disp: , Rfl:  No current facility-administered medications for this visit.  Facility-Administered Medications Ordered in Other Visits:    sodium phosphate  (FLEET) 7-19 GM/118ML enema 1 enema, 1 enema, Rectal, Once, Cam Morene ORN, MD  Review of Systems:  Negative unless indicated in HPI.   Physical Exam: Vitals:   02/16/23 1006  BP: 110/70  Pulse: 60  Temp: 97.8 F (36.6 C)  TempSrc: Oral  SpO2: 97%  Weight: 165 lb 1.6 oz (74.9 kg)  Height: 5' 9.75 (1.772 m)    Body mass index is 23.86 kg/m.   Physical Exam Vitals reviewed.  Constitutional:      General: He is not in acute distress.    Appearance: Normal appearance. He is not ill-appearing, toxic-appearing or diaphoretic.  HENT:     Head: Normocephalic.     Right Ear: Tympanic membrane, ear canal and external ear normal. There is no impacted cerumen.     Left Ear: Tympanic membrane, ear canal and external ear normal. There is no impacted cerumen.     Nose: Nose normal.     Mouth/Throat:     Mouth: Mucous membranes are moist.     Pharynx: Oropharynx is clear. No oropharyngeal exudate or posterior oropharyngeal erythema.  Eyes:     General: No scleral icterus.       Right eye: No discharge.        Left eye: No discharge.     Conjunctiva/sclera: Conjunctivae normal.     Pupils: Pupils are equal, round, and reactive to light.  Neck:     Vascular: No carotid bruit.  Cardiovascular:     Rate and Rhythm: Normal rate and regular rhythm.     Pulses: Normal pulses.     Heart sounds: Normal heart sounds.  Pulmonary:     Effort: Pulmonary effort is normal. No respiratory distress.     Breath sounds: Normal breath sounds.  Abdominal:     General: Abdomen is flat. Bowel sounds are normal.      Palpations: Abdomen is soft.  Musculoskeletal:        General: Normal range of motion.     Cervical back: Normal range of motion.  Skin:    General: Skin is warm and dry.  Neurological:     General: No focal deficit present.     Mental Status: He is alert and oriented to person, place, and time. Mental status is at baseline.  Psychiatric:  Mood and Affect: Mood normal.        Behavior: Behavior normal.        Thought Content: Thought content normal.        Judgment: Judgment normal.    Subsequent Medicare wellness visit   1. Risk factors, based on past  M,S,F - Cardiac Risk Factors include: advanced age (>52men, >16 women)   2.  Physical activities: Dietary issues and exercise activities discussed:      3.  Depression/mood:  Flowsheet Row Office Visit from 02/16/2023 in [facility] Total Score 0        4.  ADL's:    02/16/2023    9:56 AM 11/16/2022   10:47 AM  In your present state of health, do you have any difficulty performing the following activities:  Hearing? 0 0  Vision? 0 0  Difficulty concentrating or making decisions? 0 0  Walking or climbing stairs? 0 0  Dressing or bathing? 0 0  Doing errands, shopping? 0 0  Preparing Food and eating ? N N  Using the Toilet? N N  In the past six months, have you accidently leaked urine? N N  Do you have problems with loss of bowel control? N N  Managing your Medications? N N  Managing your Finances? N N  Housekeeping or managing your Housekeeping? N N     5.  Fall risk:     09/03/2021   11:05 AM 11/16/2022   10:47 AM 11/19/2022    4:17 PM 12/21/2022    3:32 PM 02/16/2023    9:58 AM  Fall Risk  Falls in the past year? 0 0 0 0 0  Was there an injury with Fall?   0 0 0  Fall Risk Category Calculator   0 0 0  Patient at Risk for Falls Due to   No Fall Risks    Fall risk Follow up   Falls evaluation completed Falls evaluation completed Falls evaluation completed      6.  Home safety: No problems identified   7.  Height weight, and visual acuity: height and weight as above, vision/hearing: Vision Screening   Right eye Left eye Both eyes  Without correction 20/30 20/30 20/30   With correction        8.  Counseling: Counseling given: Not Answered    9. Lab orders based on risk factors: Laboratory update will be reviewed   10. Cognitive assessment:    09/09/2017    1:57 PM 09/08/2016    1:49 PM  MMSE - Mini Mental State Exam  Not completed: -- --        02/16/2023    9:58 AM  6CIT Screen  What Year? 0 points  What month? 0 points  What time? 0 points  Count back from 20 0 points  Months in reverse 2 points  Repeat phrase 0 points  Total Score 2 points     11. Screening: Patient provided with a written and personalized 5-10 year screening schedule in the AVS. Health Maintenance  Topic Date Due   Pneumonia Vaccine (4 of 4 - PPSV23 or PCV20) 10/12/2016   COVID-19 Vaccine (5 - 2024-25 season) 01/20/2023   Medicare Annual Wellness Visit  02/16/2024   DTaP/Tdap/Td vaccine (3 - Td or Tdap) 05/05/2027   Colon Cancer Screening  11/14/2031   Flu Shot  Completed   Hepatitis C Screening  Completed   Zoster (Shingles) Vaccine  Completed   HPV  Vaccine  Aged Out    12. Provider List Update: Patient Care Team    Relationship Specialty Notifications Start End  Theophilus Andrews, Tully GRADE, MD PCP - General Internal Medicine  02/09/18   Vertell Pont, RN Oncology Nurse Navigator   04/30/22   Cam Morene ORN, MD Attending Physician Urology  11/19/22   Laneta Ardeen LABOR, DMD  Dentistry  11/19/22   Kennyth Cy RAMAN, DO  Ophthalmology  11/19/22   Shona Rush, MD  Dermatology  11/19/22      13. Advance Directives: Does Patient Have a Medical Advance Directive?: No Would patient like information on creating a medical advance directive?: No - Patient declined  14. Opioids: Patient is not on any opioid prescriptions and has no risk factors for a  substance use disorder.   15.   Goals      patient stated     Be more energetic       Protect My Health     Timeframe:  Long-Range Goal Priority:  Medium Start Date:                             Expected End Date:                          - schedule and keep appointment for annual check-up    Why is this important?   Screening tests can find diseases early when they are easier to treat.  Your doctor or nurse will talk with you about which tests are important for you.  Getting shots for common diseases like the flu and shingles will help prevent them.     Notes:          I have personally reviewed and noted the following in the patient's chart:   Medical and social history Use of alcohol, tobacco or illicit drugs  Current medications and supplements Functional ability and status Nutritional status Physical activity Advanced directives List of other physicians Hospitalizations, surgeries, and ER visits in previous 12 months Vitals Screenings to include cognitive, depression, and falls Referrals and appointments  In addition, I have reviewed and discussed with patient certain preventive protocols, quality metrics, and best practice recommendations. A written personalized care plan for preventive services as well as general preventive health recommendations were provided to patient.   Impression and Plan:  Encounter for subsequent annual wellness visit (AWV) in Medicare patient -     Lipid panel; Future  Malignant neoplasm of prostate (HCC) -     CBC with Differential/Platelet; Future -     Comprehensive metabolic panel; Future   -Recommend routine eye and dental care. -Healthy lifestyle discussed in detail. -Labs to be updated today. -Prostate cancer screening: Followed by urology Health Maintenance  Topic Date Due   Pneumonia Vaccine (4 of 4 - PPSV23 or PCV20) 10/12/2016   COVID-19 Vaccine (5 - 2024-25 season) 01/20/2023   Medicare Annual Wellness Visit   02/16/2024   DTaP/Tdap/Td vaccine (3 - Td or Tdap) 05/05/2027   Colon Cancer Screening  11/14/2031   Flu Shot  Completed   Hepatitis C Screening  Completed   Zoster (Shingles) Vaccine  Completed   HPV Vaccine  Aged Out     -Advised to update RSV vaccination.    Tully Theophilus Andrews, MD [facility] at [facility]

## 2023-03-08 ENCOUNTER — Encounter: Payer: Self-pay | Admitting: Internal Medicine

## 2023-03-16 DIAGNOSIS — C61 Malignant neoplasm of prostate: Secondary | ICD-10-CM | POA: Diagnosis not present

## 2023-03-16 LAB — PSA: PSA: 0.21

## 2023-03-23 DIAGNOSIS — Z8546 Personal history of malignant neoplasm of prostate: Secondary | ICD-10-CM | POA: Diagnosis not present

## 2023-03-23 DIAGNOSIS — N5201 Erectile dysfunction due to arterial insufficiency: Secondary | ICD-10-CM | POA: Diagnosis not present

## 2023-03-29 ENCOUNTER — Encounter: Payer: Self-pay | Admitting: Internal Medicine

## 2023-04-14 DIAGNOSIS — Z23 Encounter for immunization: Secondary | ICD-10-CM | POA: Diagnosis not present

## 2023-09-21 DIAGNOSIS — Z8546 Personal history of malignant neoplasm of prostate: Secondary | ICD-10-CM | POA: Diagnosis not present

## 2023-09-21 LAB — PSA: PSA: 0.3

## 2023-09-28 DIAGNOSIS — Z8546 Personal history of malignant neoplasm of prostate: Secondary | ICD-10-CM | POA: Diagnosis not present

## 2023-10-07 ENCOUNTER — Encounter: Payer: Self-pay | Admitting: Internal Medicine

## 2023-11-11 DIAGNOSIS — Z23 Encounter for immunization: Secondary | ICD-10-CM | POA: Diagnosis not present

## 2023-11-17 DIAGNOSIS — H43813 Vitreous degeneration, bilateral: Secondary | ICD-10-CM | POA: Diagnosis not present

## 2023-11-17 DIAGNOSIS — H04123 Dry eye syndrome of bilateral lacrimal glands: Secondary | ICD-10-CM | POA: Diagnosis not present

## 2023-11-17 DIAGNOSIS — H26491 Other secondary cataract, right eye: Secondary | ICD-10-CM | POA: Diagnosis not present

## 2023-11-17 DIAGNOSIS — H524 Presbyopia: Secondary | ICD-10-CM | POA: Diagnosis not present

## 2023-11-17 DIAGNOSIS — H18593 Other hereditary corneal dystrophies, bilateral: Secondary | ICD-10-CM | POA: Diagnosis not present

## 2023-11-17 DIAGNOSIS — H52223 Regular astigmatism, bilateral: Secondary | ICD-10-CM | POA: Diagnosis not present

## 2024-02-24 ENCOUNTER — Ambulatory Visit: Admitting: Family Medicine

## 2024-02-24 DIAGNOSIS — Z0000 Encounter for general adult medical examination without abnormal findings: Secondary | ICD-10-CM

## 2024-02-24 NOTE — Patient Instructions (Addendum)
 I really enjoyed getting to talk with you today! I am available on Tuesdays and Thursdays for virtual visits if you have any questions or concerns, or if I can be of any further assistance.   CHECKLIST FROM ANNUAL WELLNESS VISIT:  -Follow up (please call to schedule if not scheduled after visit):   -yearly  physical exam  Here is a list of your preventive care/health maintenance measures and the plan for each if any are due:  PLAN For any measures below that may be due:    1. Can get vaccines at the pharmacy, please let us  know if you do so that we can update your record.   Health Maintenance  Topic Date Due   Pneumococcal Vaccine: 50+ Years (4 of 4 - PCV20 or PCV21) 12/21/2018   COVID-19 Vaccine (5 - 2025-26 season) 10/04/2023   Medicare Annual Wellness (AWV)  02/23/2025   DTaP/Tdap/Td (3 - Td or Tdap) 05/05/2027   Colonoscopy  11/14/2031   Influenza Vaccine  Completed   Hepatitis C Screening  Completed   Zoster Vaccines- Shingrix   Completed   Meningococcal B Vaccine  Aged Out    -See a dentist at least yearly  -Get your eyes checked and then per your eye specialist's recommendations  -Other issues addressed today:   -I have included below further information regarding a healthy whole foods based diet, physical activity guidelines for adults, stress management and opportunities for social connections. I hope you find this information useful.   -----------------------------------------------------------------------------------------------------------------------------------------------------------------------------------------------------------------------------------------------------------    NUTRITION: -eat real food: lots of colorful vegetables (half the plate) and fruits -5-7 servings of vegetables and fruits per day (fresh or steamed is best), exp. 2 servings of vegetables with lunch and dinner and 2 servings of fruit per day. Berries and greens such as kale and  collards are great choices.  -consume on a regular basis:  fresh fruits, fresh veggies, fish, nuts, seeds, healthy oils (such as olive oil, avocado oil), whole grains (make sure for bread/pasta/crackers/etc., that the first ingredient on label contains the word whole), legumes. -can eat small amounts of dairy and lean meat (no larger than the palm of your hand), but avoid processed meats such as ham, bacon, lunch meat, etc. -drink water -try to avoid fast food and pre-packaged foods, processed meat, ultra processed foods/beverages (donuts, candy, etc.) -most experts advise limiting sodium to < 2300mg  per day, should limit further is any chronic conditions such as high blood pressure, heart disease, diabetes, etc. The American Heart Association advised that < 1500mg  is is ideal -try to avoid foods/beverages that contain any ingredients with names you do not recognize  -try to avoid foods/beverages  with added sugar or sweeteners/sweets  -try to avoid sweet drinks (including diet drinks): soda, juice, Gatorade, sweet tea, power drinks, diet drinks -try to avoid white rice, white bread, pasta (unless whole grain)  EXERCISE GUIDELINES FOR ADULTS: -if you wish to increase your physical activity, do so gradually and with the approval of your doctor -STOP and seek medical care immediately if you have any chest pain, chest discomfort or trouble breathing when starting or increasing exercise  -move and stretch your body, legs, feet and arms when sitting for long periods -Physical activity guidelines for optimal health in adults: -get at least 150 minutes per week of moderate exercise (can talk, but not sing); this is about 20-30 minutes of sustained activity 5-7 days per week or two 10-15 minute episodes of sustained activity 5-7 days per week -do some  muscle building/resistance training/strength training at least 2 days per week  -balance exercises 3+ days per week:   Stand somewhere where you have  something sturdy to hold onto if you lose balance    1) lift up on toes, then back down, start with 5x per day and work up to 20x   2) stand and lift one leg straight out to the side so that foot is a few inches of the floor, start with 5x each side and work up to 20x each side   3) stand on one foot, start with 5 seconds each side and work up to 20 seconds on each side  If you need ideas or help with getting more active:  -Silver sneakers https://tools.silversneakers.com  -Walk with a Doc: Http://www.duncan-[email or web address]/  -try to include resistance (weight lifting/strength building) and balance exercises twice per week: or the following link for ideas: http://castillo-powell.com/  buyducts.dk  STRESS MANAGEMENT: -can try meditating, or just sitting quietly with deep breathing while intentionally relaxing all parts of your body for 5 minutes daily -if you need further help with stress, anxiety or depression please follow up with your primary doctor or contact the wonderful folks at [facility]: [phone]  SOCIAL CONNECTIONS: -options in [place] if you wish to engage in more social and exercise related activities:  -Silver sneakers https://tools.silversneakers.com  -Walk with a Doc: Http://www.duncan-[email or web address]/  -Check out the [facility] Active Adults 50+ section on the [place] of [employer] (hiking clubs, book clubs, cards and games, chess, exercise classes, aquatic classes and much more) - see the website for details: https://www.[place].gov/departments/parks-recreation/active-adults50  -YouTube has lots of exercise videos for different ages and abilities as well  -[facility] (a variety of indoor and outdoor inperson activities for adults). [phone]. [address]  -Virtual Online Classes (a variety of topics): see seniorplanet.org or call  [phone]  -consider volunteering at a school, hospice center, church, senior center or elsewhere          ADVANCED HEALTHCARE DIRECTIVES:  [place] Advanced Directives assistance:   expressweek.com.cy  Everyone should have advanced health care directives in place. This is so that you get the care you want, should you ever be in a situation where you are unable to make your own medical decisions.   From the [place] Advanced Directive Website: Advance Health Care Directives are legal documents in which you give written instructions about your health care if, in the future, you cannot speak for yourself.   A health care power of attorney allows you to name a person you trust to make your health care decisions if you cannot make them yourself. A declaration of a desire for a natural death (or living will) is document, which states that you desire not to have your life prolonged by extraordinary measures if you have a terminal or incurable illness or if you are in a vegetative state. An advance instruction for mental health treatment makes a declaration of instructions, information and preferences regarding your mental health treatment. It also states that you are aware that the advance instruction authorizes a mental health treatment provider to act according to your wishes. It may also outline your consent or refusal of mental health treatment. A declaration of an anatomical gift allows anyone over the age of 56 to make a gift by will, organ donor card or other document.   Please see the following website or an elder law attorney for forms, FAQs and for completion of advanced directives: [place]  D[employer] Services Operational Officer  of State Advance Health Care Directives Advance Health Care Directives (http://guzman.[email or web address]/)  Or copy and paste the following to your web browser: [email or web address].fi

## 2024-02-24 NOTE — Progress Notes (Signed)
 "  ----------------------------------------------------------------------------------------------------------------------------------------------------------------------------------------------------------------------  Because this visit was a virtual/telehealth visit, some criteria may be missing or patient reported. Any vitals not documented were not able to be obtained and vitals that have been documented are patient reported.    MEDICARE ANNUAL PREVENTIVE CARE VISIT WITH PROVIDER (Welcome to Medicare, initial annual wellness or annual wellness exam)  Virtual Visit via Video Note  I connected with John [name] on 02/24/24  by a video enabled telemedicine application and verified that I am speaking with the correct person using two identifiers.  Location patient: home Location provider:work or home office Persons participating in the virtual visit: patient, provider  Concerns and/or follow up today: detailed intake and health/risks assessment completed on flow sheets and below- please see for details. Reports has been doing pretty well.   How often do you have a drink containing alcohol?daily How many drinks containing alcohol do you have on a typical day when you are drinking?1-2 How often do you have six or more drinks on one occasion?never Have you ever smoked?n Quit date if applicable? na  How many packs a day do/did you smoke? na Do you use smokeless tobacco?n Do you use an illicit drugs?n Do you feel safe at home?y Last dentist visit?goes on a regular basis Last eye Exam and location?Dr. Kennyth - 11/2023   See HM section in Epic for other details of completed HM.    ROS: negative for report of fevers, unintentional weight loss, vision changes, vision loss, hearing loss or change, sob, hemoptysis, melena, hematochezia, hematuria,bleeding or bruising  Patient-completed extensive health risk assessment - reviewed and discussed with the patient: See Health Risk Assessment  completed with patient prior to the visit either above or in recent phone note. This was reviewed in detailed with the patient today and appropriate recommendations, orders and referrals were placed as needed per Summary below and patient instructions.   Review of Medical History: -PMH, PSH, Family History and current specialty and care providers reviewed and updated and listed below   Patient Care Team: Theophilus Andrews, Tully GRADE, MD as PCP - General (Internal Medicine) Vertell Pont, RN as Oncology Nurse Navigator Cam Morene ORN, MD as Attending Physician (Urology) Bonnick, Ardeen LABOR, DMD (Dentistry) Kennyth Cy GORMAN, DO (Ophthalmology) Shona Rush, MD (Dermatology)   Past Medical History:  Diagnosis Date   Cataract    Chronic constipation    ED (erectile dysfunction)    Family history of coronary artery disease    Hemorrhoids    History of colon polyps 2006   History of hepatitis A age 23   History of syncope    per pt hx syncopy episodes throughout life--- consultation w/ dr jeffrie (cardiologist)--  negative stress test  (per lov note 04-19-2014  vasovagal/ situtional )   Hyperplasia of prostate with lower urinary tract symptoms (LUTS)    Prostate cancer [facility]) urologist-  dr cam   dx 08-23-2015--  Gleason 3+4, PSA 4.75-- active surveillance;  04/ 2021 Stage T2a, Gleason 3+4, PSA 11.9, scheduled for radiactive prostate seed implants 08-30-2019   Wears glasses    Wears glasses     Past Surgical History:  Procedure Laterality Date   CARDIOVASCULAR STRESS TEST  05-11-2013   dr jeffrie   Low risk nuclear study w/ small fixed defect in the inferolateral wall much more prominent on rest images c/w diaphragmatic attenuation, no evidence ishcemia/  normal LV function and wall motion, ef 62%   COLONOSCOPY  last one 08-25-2011   CYSTOSCOPY N/A  08/30/2019   Procedure: CYSTOSCOPY FLEXIBLE;  Surgeon: Cam Morene ORN, MD;  Location: [facility];  Service: Urology;   Laterality: N/A;  no seeds found in bladder   EYE SURGERY  2023   PROSTATE BIOPSY N/A 01/15/2017   Procedure: BIOPSY TRANSRECTAL ULTRASONIC PROSTATE (TUBP);  Surgeon: Cam Morene ORN, MD;  [facility];  Service: Urology;  Laterality: N/A;   PROSTATE BIOPSY  2017   PROSTATE BIOPSY N/A 05/18/2019   Procedure: SATURATION BIOPSY TRANSRECTAL ULTRASONIC PROSTATE (TUBP);  Surgeon: Cam Morene ORN, MD;  Location: [facility];  Service: Urology;  Laterality: N/A;   PROSTATE BIOPSY N/A 03/26/2022   Procedure: BIOPSY TRANSRECTAL ULTRASONIC PROSTATE (TUBP);  Surgeon: Cam Morene ORN, MD;  Location: [facility];  Service: Urology;  Laterality: N/A;  30 MINS PLACE IN RM 5 AT 12:00 PM   RADIOACTIVE SEED IMPLANT N/A 08/30/2019   Procedure: RADIOACTIVE SEED IMPLANT/BRACHYTHERAPY IMPLANT;  Surgeon: Cam Morene ORN, MD;  Location: [facility];  Service: Urology;  Laterality: N/A;   73 seeds implanted   SPACE OAR INSTILLATION N/A 08/30/2019   Procedure: SPACE OAR INSTILLATION;  Surgeon: Cam Morene ORN, MD;  Location: [facility];  Service: Urology;  Laterality: N/A;   TONSILLECTOMY  age 87    Social History   Socioeconomic History   Marital status: Married    Spouse name: Not on file   Number of children: Not on file   Years of education: Not on file   Highest education level: Bachelor's degree (e.g., BA, AB, BS)  Occupational History   Not on file  Tobacco Use   Smoking status: Never   Smokeless tobacco: Never  Vaping Use   Vaping status: Never Used  Substance and Sexual Activity   Alcohol use: Yes    Alcohol/week: 5.0 standard drinks of alcohol    Types: 5 Cans of beer per week    Comment: 2 drink daily average---    Drug use: No   Sexual activity: Yes    Birth control/protection: None    Comment: with aid of sildenafil  Other Topics Concern   Not on file  Social History Narrative   No  caffeine    Social Drivers of Health   Tobacco Use: Low Risk (02/21/2024)   Patient History    Smoking Tobacco Use: Never    Smokeless Tobacco Use: Never    Passive Exposure: Not on file  Financial Resource Strain: Low Risk (02/21/2024)   Overall Financial Resource Strain (CARDIA)    Difficulty of Paying Living Expenses: Not hard at all  Food Insecurity: No Food Insecurity (02/21/2024)   Epic    Worried About Programme Researcher, Broadcasting/film/video in the Last Year: Never true    Ran Out of Food in the Last Year: Never true  Transportation Needs: No Transportation Needs (02/21/2024)   Epic    Lack of Transportation (Medical): No    Lack of Transportation (Non-Medical): No  Physical Activity: Inactive (02/21/2024)   Exercise Vital Sign    Days of Exercise per Week: 0 days    Minutes of Exercise per Session: Not on file  Stress: No Stress Concern Present (02/21/2024)   [employer] of Occupational Health - Occupational Stress Questionnaire    Feeling of Stress: Not at all  Social Connections: Moderately Integrated (02/21/2024)   Social Connection and Isolation Panel    Frequency of Communication with Friends and Family: Once a week  Frequency of Social Gatherings with Friends and Family: Once a week    Attends Religious Services: More than 4 times per year    Active Member of [employer] or Organizations: Yes    Attends Banker Meetings: More than 4 times per year    Marital Status: Married  Catering Manager Violence: Not At Risk (02/16/2023)   Humiliation, Afraid, Rape, and Kick questionnaire    Fear of Current or Ex-Partner: No    Emotionally Abused: No    Physically Abused: No    Sexually Abused: No  Depression (PHQ2-9): Low Risk (02/24/2024)   Depression (PHQ2-9)    PHQ-2 Score: 0  Alcohol Screen: Low Risk (02/21/2024)   Alcohol Screen    Last Alcohol Screening Score (AUDIT): 4  Housing: Unknown (02/21/2024)   Epic    Unable to Pay for Housing in the Last Year: No    Number of  Times Moved in the Last Year: Not on file    Homeless in the Last Year: No  Utilities: Not At Risk (02/16/2023)   AHC Utilities    Threatened with loss of utilities: No  Health Literacy: Adequate Health Literacy (02/16/2023)   B1300 Health Literacy    Frequency of need for help with medical instructions: Never    Family History  Problem Relation Age of Onset   Breast cancer Mother 38   Heart disease Mother        CHF   Parkinsonism Mother    Cancer Mother    Heart attack Father    Colon polyps Father    Heart disease Father    Heart disease Brother        post CABG   Colon cancer Maternal Aunt 34   Lung cancer Maternal Uncle 84       heavy smoker   Cancer Cousin 20       paternal first cousin. type unknown.   Testicular cancer Nephew 54   Colon cancer Nephew 68   Esophageal cancer Neg Hx    Stomach cancer Neg Hx    Rectal cancer Neg Hx     Medications Ordered Prior to Encounter[1]  Allergies[2]     Physical Exam Vitals requested from patient and listed below if patient had equipment and was able to obtain at home for this virtual visit: There were no vitals filed for this visit. Estimated body mass index is 23.86 kg/m as calculated from the following:   Height as of 02/16/23: 5' 9.75 (1.772 m).   Weight as of 02/16/23: 165 lb 1.6 oz (74.9 kg).  EKG (optional): deferred due to virtual visit  GENERAL: alert, oriented, no acute distress detected; full vision exam deferred due to pandemic and/or virtual encounter   HEENT: atraumatic, conjunttiva clear, no obvious abnormalities on inspection of external nose and ears  NECK: normal movements of the head and neck  LUNGS: on inspection no signs of respiratory distress, breathing rate appears normal, no obvious gross SOB, gasping or wheezing  CV: no obvious cyanosis  MS: moves all visible extremities without noticeable abnormality  PSYCH/NEURO: pleasant and cooperative, no obvious depression or anxiety, speech and  thought processing grossly intact, Cognitive function grossly intact  Flowsheet Row Office Visit from [facility]/16/2023 [facility]  PHQ-9 Total Score 0        02/24/2024    4:16 PM 02/16/2023    9:55 AM 12/21/2022    3:32 PM 11/19/2022    4:17 PM 09/03/2021  11:02 AM  Depression screen PHQ 2/9  Decreased Interest 0 0 1 0 0  Down, Depressed, Hopeless 0 0 0 0 0  PHQ - 2 Score 0 0 1 0 0  Altered sleeping  0 0 0 0  Tired, decreased energy  0 2 3 1   Change in appetite  0 0 0 0  Feeling bad or failure about yourself   0 0 0 0  Trouble concentrating  0 0 0 0  Moving slowly or fidgety/restless  0 0 0 0  Suicidal thoughts  0 0 0 0  PHQ-9 Score  0  3  3  1    Difficult doing work/chores   Somewhat difficult Somewhat difficult Somewhat difficult     Data saved with a previous flowsheet row definition       11/19/2022    4:17 PM 12/21/2022    3:32 PM 02/16/2023    9:58 AM 02/21/2024   11:54 AM 02/24/2024    4:06 PM  Fall Risk  Falls in the past year? 0 0 0 0    Was there an injury with Fall? 0  0  0   0  Fall Risk Category Calculator 0 0 0    Patient at Risk for Falls Due to No Fall Risks    No Fall Risks  Fall risk Follow up Falls evaluation completed Falls evaluation completed Falls evaluation completed  Falls evaluation completed     Manually entered by patient   Data saved with a previous flowsheet row definition     SUMMARY AND PLAN:  Encounter for Medicare annual wellness exam   Discussed applicable health maintenance/preventive health measures and advised and referred or ordered per patient preferences: -discussed vaccines due recs/risks, he says he had his covid vaccine but did not have record at the time of the visit Health Maintenance  Topic Date Due   Pneumococcal Vaccine: 50+ Years (4 of 4 - PCV20 or PCV21) 12/21/2018   COVID-19 Vaccine (5 - 2025-26 season) 10/04/2023   Medicare Annual Wellness (AWV)  02/23/2025   DTaP/Tdap/Td (3 - Td  or Tdap) 05/05/2027   Colonoscopy  11/14/2031   Influenza Vaccine  Completed   Hepatitis C Screening  Completed   Zoster Vaccines- Shingrix   Completed   Meningococcal B Vaccine  Aged Out     Education and counseling on the following was provided based on the above review of health and a plan/checklist for the patient, along with additional information discussed, was provided for the patient in the patient instructions :  -Advised on importance of completing advanced directives, discussed options for completing and provided information in patient instructions as well -Provided counseling and plan for fall risks in the home. Advised rubber backing on rugs and tub stickers. -Advised and counseled on a healthy lifestyle - including the importance of a healthy diet, regular physical activity, social connections and stress management. -Reviewed patient's current diet. He reports a healthy diet. A summary of a healthy diet was provided in the Patient Instructions.  -reviewed patient's current physical activity level and discussed exercise guidelines for adults. Discussed community resources and ideas for safe exercise at home to assist in meeting exercise guideline recommendations in a safe and healthy way.  -Advise yearly dental visits at minimum and regular eye exams -Advised and counseled on alcohol safe limits, risks  Follow up: see patient instructions   Patient Instructions  I really enjoyed getting to talk with you today! I am available on Tuesdays and Thursdays  for virtual visits if you have any questions or concerns, or if I can be of any further assistance.   CHECKLIST FROM ANNUAL WELLNESS VISIT:  -Follow up (please call to schedule if not scheduled after visit):   -yearly  physical exam  Here is a list of your preventive care/health maintenance measures and the plan for each if any are due:  PLAN For any measures below that may be due:    1. Can get vaccines at the pharmacy,  please let us  know if you do so that we can update your record.   Health Maintenance  Topic Date Due   Pneumococcal Vaccine: 50+ Years (4 of 4 - PCV20 or PCV21) 12/21/2018   COVID-19 Vaccine (5 - 2025-26 season) 10/04/2023   Medicare Annual Wellness (AWV)  02/23/2025   DTaP/Tdap/Td (3 - Td or Tdap) 05/05/2027   Colonoscopy  11/14/2031   Influenza Vaccine  Completed   Hepatitis C Screening  Completed   Zoster Vaccines- Shingrix   Completed   Meningococcal B Vaccine  Aged Out    -See a dentist at least yearly  -Get your eyes checked and then per your eye specialist's recommendations  -Other issues addressed today:   -I have included below further information regarding a healthy whole foods based diet, physical activity guidelines for adults, stress management and opportunities for social connections. I hope you find this information useful.   -----------------------------------------------------------------------------------------------------------------------------------------------------------------------------------------------------------------------------------------------------------    NUTRITION: -eat real food: lots of colorful vegetables (half the plate) and fruits -5-7 servings of vegetables and fruits per day (fresh or steamed is best), exp. 2 servings of vegetables with lunch and dinner and 2 servings of fruit per day. Berries and greens such as kale and collards are great choices.  -consume on a regular basis:  fresh fruits, fresh veggies, fish, nuts, seeds, healthy oils (such as olive oil, avocado oil), whole grains (make sure for bread/pasta/crackers/etc., that the first ingredient on label contains the word whole), legumes. -can eat small amounts of dairy and lean meat (no larger than the palm of your hand), but avoid processed meats such as ham, bacon, lunch meat, etc. -drink water -try to avoid fast food and pre-packaged foods, processed meat, ultra processed  foods/beverages (donuts, candy, etc.) -most experts advise limiting sodium to < 2300mg  per day, should limit further is any chronic conditions such as high blood pressure, heart disease, diabetes, etc. The American Heart Association advised that < 1500mg  is is ideal -try to avoid foods/beverages that contain any ingredients with names you do not recognize  -try to avoid foods/beverages  with added sugar or sweeteners/sweets  -try to avoid sweet drinks (including diet drinks): soda, juice, Gatorade, sweet tea, power drinks, diet drinks -try to avoid white rice, white bread, pasta (unless whole grain)  EXERCISE GUIDELINES FOR ADULTS: -if you wish to increase your physical activity, do so gradually and with the approval of your doctor -STOP and seek medical care immediately if you have any chest pain, chest discomfort or trouble breathing when starting or increasing exercise  -move and stretch your body, legs, feet and arms when sitting for long periods -Physical activity guidelines for optimal health in adults: -get at least 150 minutes per week of moderate exercise (can talk, but not sing); this is about 20-30 minutes of sustained activity 5-7 days per week or two 10-15 minute episodes of sustained activity 5-7 days per week -do some muscle building/resistance training/strength training at least 2 days per week  -balance exercises 3+ days  per week:   Stand somewhere where you have something sturdy to hold onto if you lose balance    1) lift up on toes, then back down, start with 5x per day and work up to 20x   2) stand and lift one leg straight out to the side so that foot is a few inches of the floor, start with 5x each side and work up to 20x each side   3) stand on one foot, start with 5 seconds each side and work up to 20 seconds on each side  If you need ideas or help with getting more active:  -Silver sneakers https://tools.silversneakers.com  -Walk with a  Doc: Http://www.duncan-williams.com/  -try to include resistance (weight lifting/strength building) and balance exercises twice per week: or the following link for ideas: http://castillo-powell.com/  buyducts.dk  STRESS MANAGEMENT: -can try meditating, or just sitting quietly with deep breathing while intentionally relaxing all parts of your body for 5 minutes daily -if you need further help with stress, anxiety or depression please follow up with your primary doctor or contact the wonderful folks at [facility]: [phone]  SOCIAL CONNECTIONS: -options in [place] if you wish to engage in more social and exercise related activities:  -Silver sneakers https://tools.silversneakers.com  -Walk with a Doc: Http://www.duncan-williams.com/  -Check out the [facility] Active Adults 50+ section on the [place] of [employer] companies (hiking clubs, book clubs, cards and games, chess, exercise classes, aquatic classes and much more) - see the website for details: https://www.[place].gov/departments/parks-recreation/active-adults50  -YouTube has lots of exercise videos for different ages and abilities as well  -[facility] (a variety of indoor and outdoor inperson activities for adults). [phone]. [address]  -Virtual Online Classes (a variety of topics): see seniorplanet.org or call [phone]  -consider volunteering at a school, hospice center, church, senior center or elsewhere          ADVANCED HEALTHCARE DIRECTIVES:  [place] Advanced Directives assistance:   expressweek.com.cy  Everyone should have advanced health care directives in place. This is so that you get the care you want, should you ever be in a situation where you are unable to make your own medical decisions.   From the [place] Advanced  Directive Website: Advance Health Care Directives are legal documents in which you give written instructions about your health care if, in the future, you cannot speak for yourself.   A health care power of attorney allows you to name a person you trust to make your health care decisions if you cannot make them yourself. A declaration of a desire for a natural death (or living will) is document, which states that you desire not to have your life prolonged by extraordinary measures if you have a terminal or incurable illness or if you are in a vegetative state. An advance instruction for mental health treatment makes a declaration of instructions, information and preferences regarding your mental health treatment. It also states that you are aware that the advance instruction authorizes a mental health treatment provider to act according to your wishes. It may also outline your consent or refusal of mental health treatment. A declaration of an anatomical gift allows anyone over the age of 81 to make a gift by will, organ donor card or other document.   Please see the following website or an elder law attorney for forms, FAQs and for completion of advanced directives: [place]  Print Production Planner Health Care Directives Advance Health Care Directives (http://guzman.com/)  Or copy and  paste the following to your web browser: Poshchat.fi    Chiquita JONELLE Cramp, DO     [1]  Current Outpatient Medications on File Prior to Visit  Medication Sig Dispense Refill   Cyanocobalamin  (B-12) 100 MCG TABS Take by mouth.     polyethylene glycol powder (MIRALAX) 17 GM/SCOOP powder      sildenafil (REVATIO) 20 MG tablet Take 2-5 tablets by mouth as needed  5   tamsulosin  (FLOMAX ) 0.4 MG CAPS capsule      Current Facility-Administered Medications on File Prior to Visit  Medication Dose Route Frequency Provider Last Rate Last Admin   sodium phosphate  (FLEET) 7-19 GM/118ML enema 1 enema  1 enema  Rectal Once Herrick, Benjamin W, MD      [2] No Known Allergies  "

## 2024-03-28 ENCOUNTER — Ambulatory Visit: Admitting: Internal Medicine
